# Patient Record
Sex: Female | Born: 1985
Health system: Southern US, Community
[De-identification: ages and names within clinical notes are randomized; demographics above are authoritative.]

## PROBLEM LIST (undated history)

## (undated) DIAGNOSIS — A630 Anogenital (venereal) warts: Secondary | ICD-10-CM

## (undated) DIAGNOSIS — Z23 Encounter for immunization: Secondary | ICD-10-CM

## (undated) DIAGNOSIS — B3731 Acute candidiasis of vulva and vagina: Secondary | ICD-10-CM

## (undated) DIAGNOSIS — F32A Depression, unspecified: Secondary | ICD-10-CM

## (undated) DIAGNOSIS — L732 Hidradenitis suppurativa: Secondary | ICD-10-CM

## (undated) DIAGNOSIS — B373 Candidiasis of vulva and vagina: Secondary | ICD-10-CM

## (undated) DIAGNOSIS — F419 Anxiety disorder, unspecified: Secondary | ICD-10-CM

## (undated) DIAGNOSIS — R87810 Cervical high risk human papillomavirus (HPV) DNA test positive: Secondary | ICD-10-CM

## (undated) HISTORY — DX: Cervical high risk human papillomavirus (HPV) DNA test positive: R87.810

## (undated) HISTORY — DX: Anogenital (venereal) warts: A63.0

## (undated) HISTORY — DX: Candidiasis of vulva and vagina: B37.3

## (undated) HISTORY — DX: Hidradenitis suppurativa: L73.2

## (undated) HISTORY — DX: Encounter for immunization: Z23

## (undated) HISTORY — PX: WISDOM TOOTH EXTRACTION: SHX21

## (undated) HISTORY — DX: Acute candidiasis of vulva and vagina: B37.31

## (undated) HISTORY — DX: Depression, unspecified: F32.A

## (undated) HISTORY — PX: TONSILLECTOMY AND ADENOIDECTOMY: SUR1326

## (undated) HISTORY — DX: Anxiety disorder, unspecified: F41.9

---
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
## 2008-06-21 ENCOUNTER — Ambulatory Visit: Payer: Self-pay | Admitting: Internal Medicine

## 2010-02-03 ENCOUNTER — Ambulatory Visit: Payer: Self-pay | Admitting: Internal Medicine

## 2010-06-26 ENCOUNTER — Ambulatory Visit: Payer: Self-pay | Admitting: Internal Medicine

## 2010-07-06 ENCOUNTER — Ambulatory Visit: Payer: Self-pay | Admitting: Internal Medicine

## 2010-07-12 IMAGING — US TRANSABDOMINAL ULTRASOUND OF PELVIS
1 series · 17 of 25 positions shown · non-contrast
Comparison: none

REASON FOR EXAM: menorrhagia x 3 mths
COMMENTS:

[Series 1: transabdominal ultrasound of pelvis · 17 of 29 slices shown]
[im 1/29]
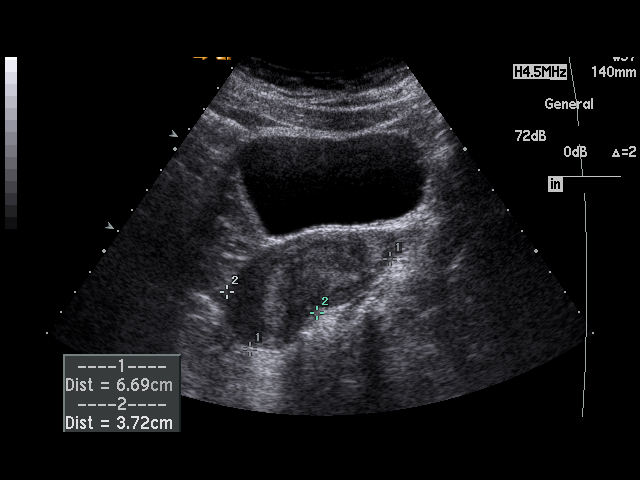
[im 3/29]
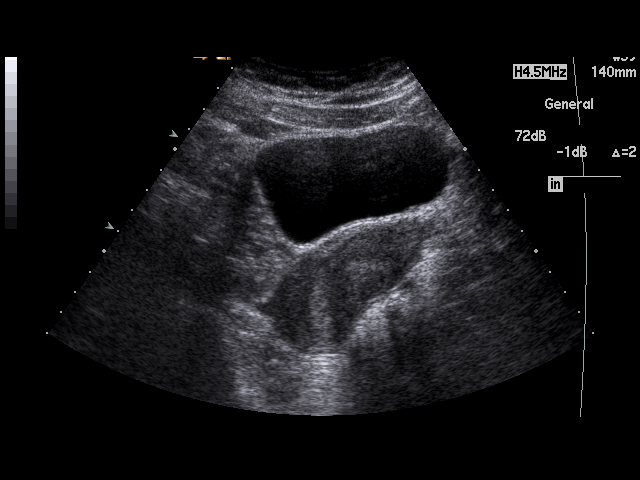
[im 4/29]
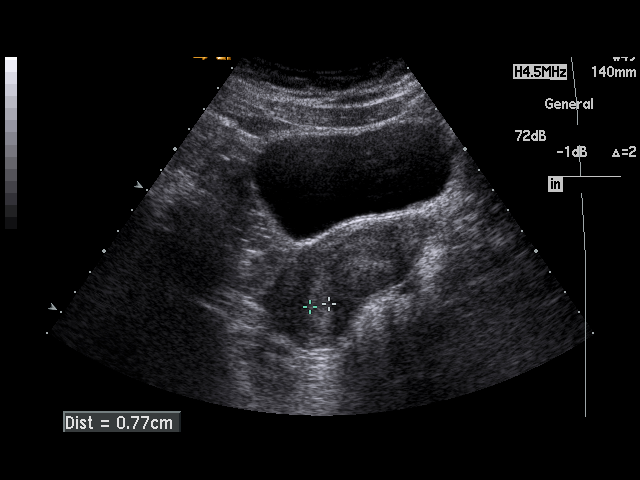
[im 6/29]
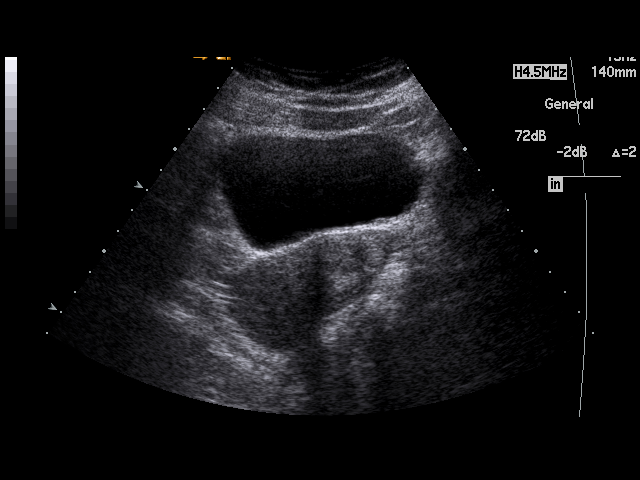
[im 8/29]
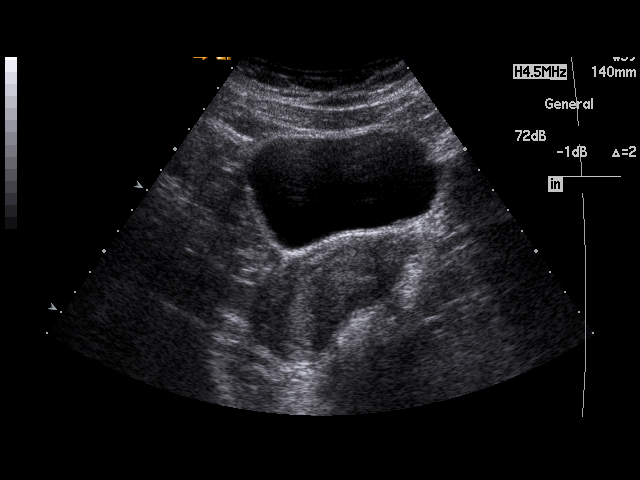
[im 10/29]
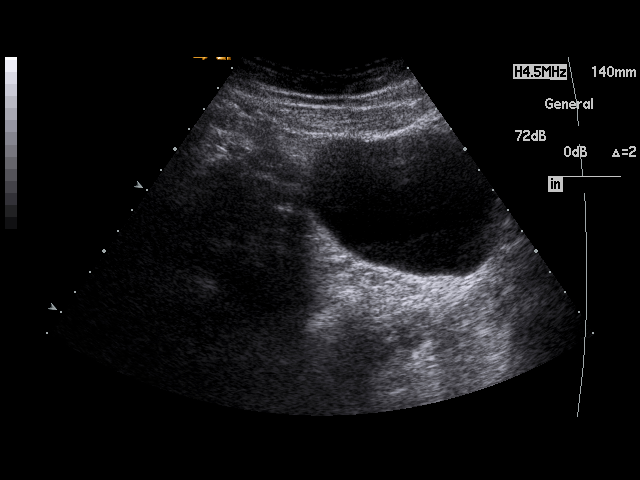
[im 11/29]
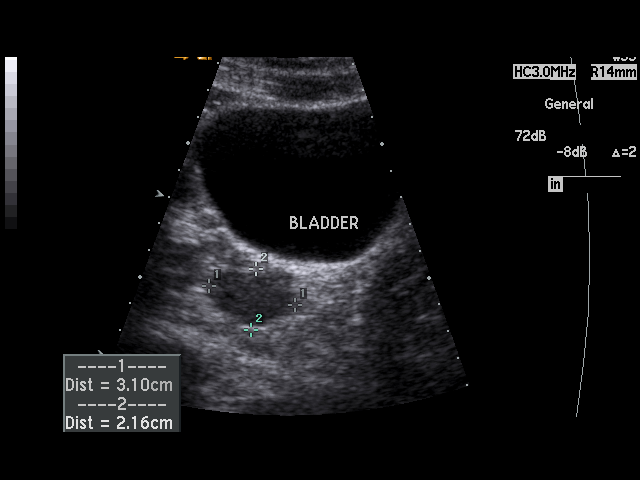
[im 13/29]
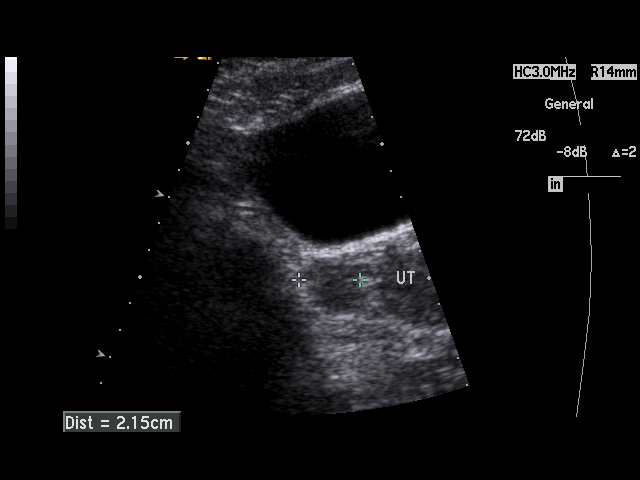
[im 15/29]
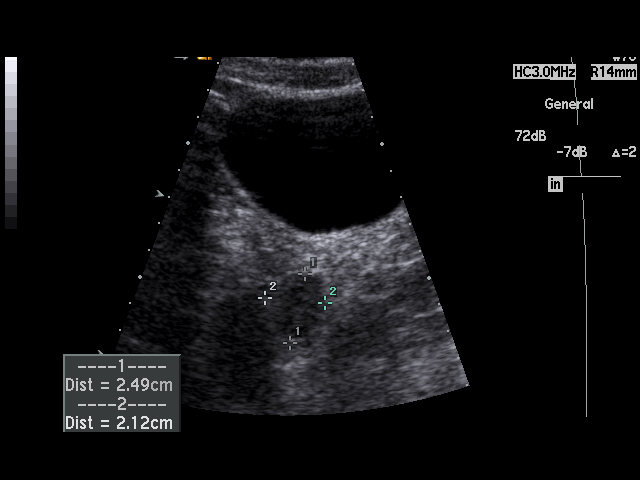
[im 16/29]
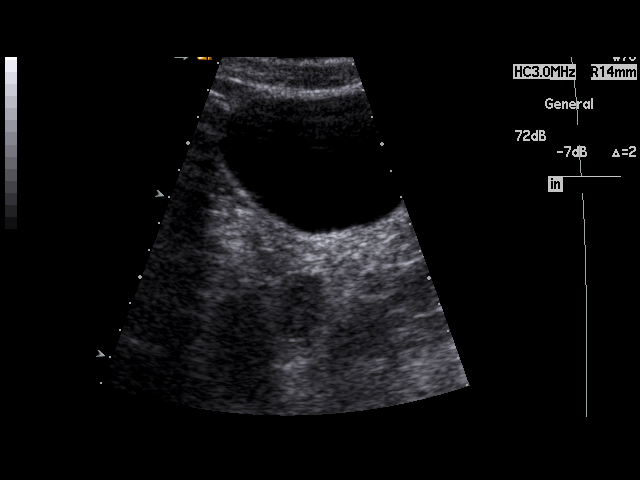
[im 18/29]
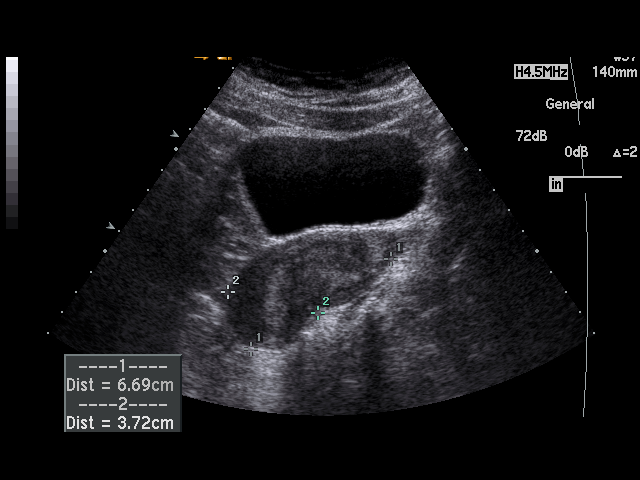
[im 19/29]
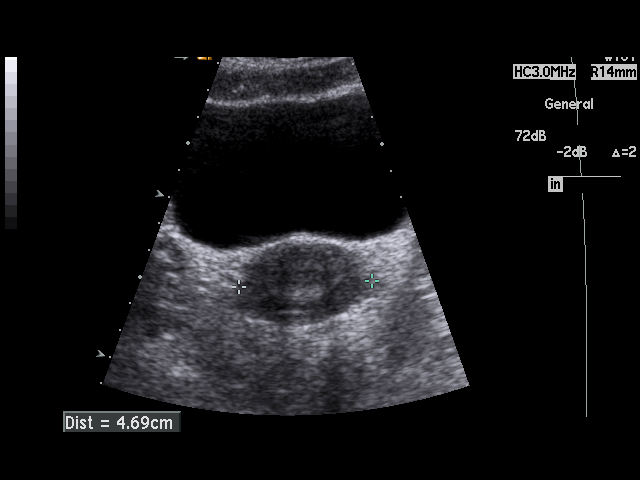
[im 22/29]
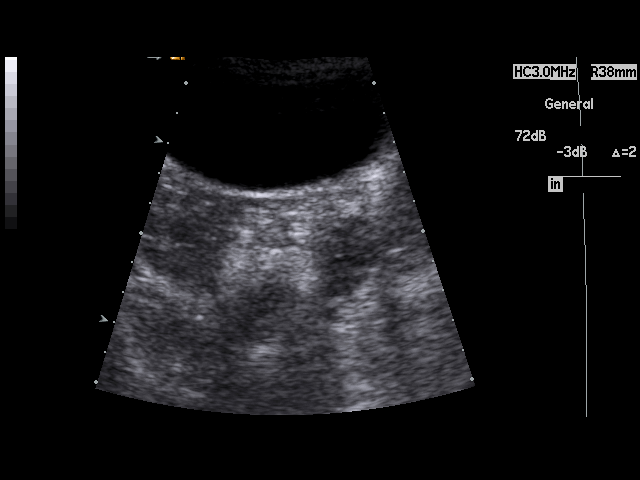
[im 23/29]
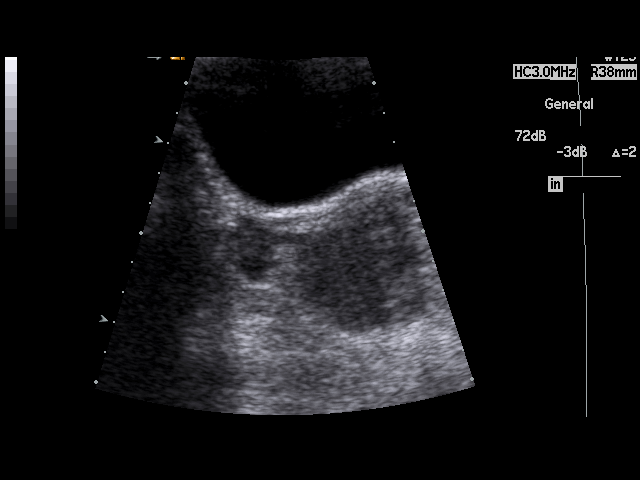
[im 25/29]
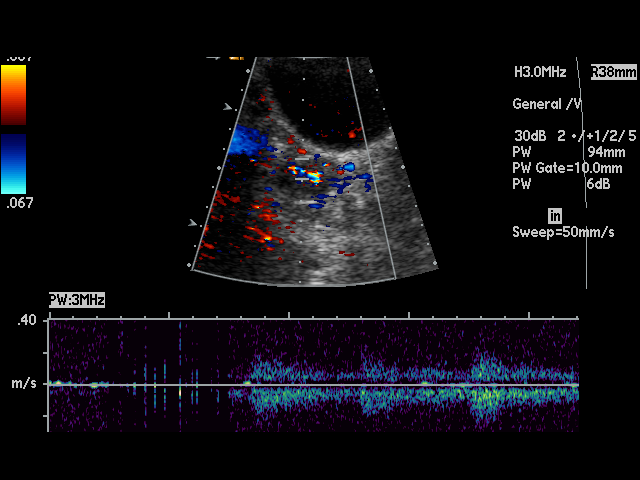
[im 26/29]
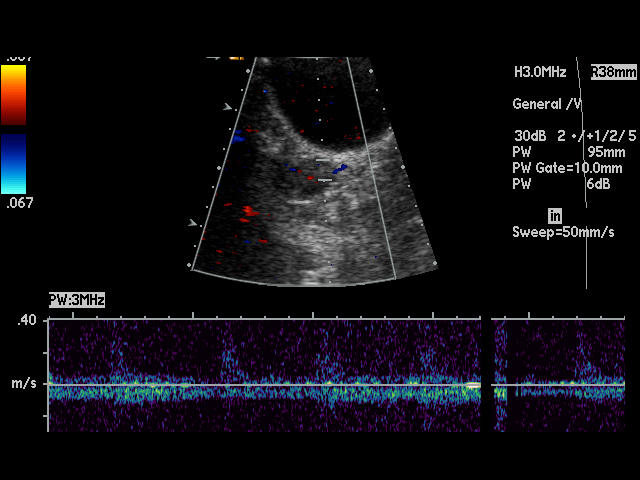
[im 29/29]
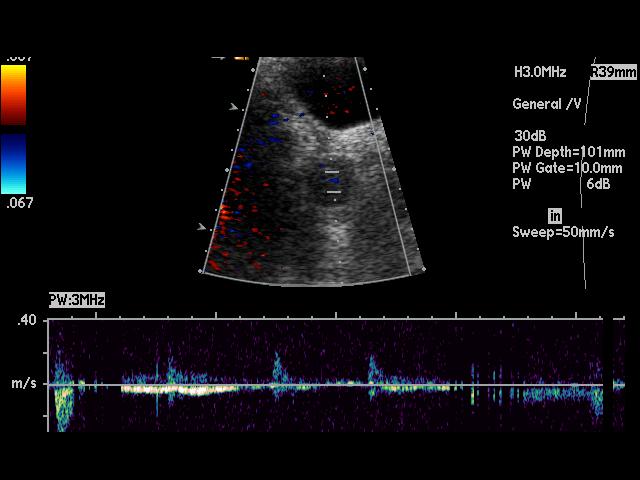

[17 of 25 positions shown; findings below may reference images not displayed]

PROCEDURE:     US  - US PELVIS MASS EXAM  - [DATE]  [DATE] [DATE]  [DATE]

RESULT:     The uterus measures 6.69 cm x 3.72 cm before 0.69 cm. The
endometrium measures 6 mm in thickness. No intrauterine products of
conception are seen. The right and left ovaries are visualized. The right
ovary measures 3.1 cm at maximum diameter and the left ovary measures
cm at maximum diameter. No abnormal adnexal masses are seen. There is no
free fluid observed in the pelvis. The visualized portion of the urinary
bladder is normal in appearance.
IMPRESSION: 1. No significant abnormalities are noted.

## 2012-03-15 LAB — OB RESULTS CONSOLE ANTIBODY SCREEN: Antibody Screen: NEGATIVE

## 2012-03-15 LAB — OB RESULTS CONSOLE RPR: RPR: NONREACTIVE

## 2012-03-22 LAB — OB RESULTS CONSOLE GC/CHLAMYDIA
Chlamydia: NEGATIVE
Gonorrhea: NEGATIVE

## 2012-05-17 NOTE — L&D Delivery Note (Signed)
Delivery Note At 6:24 PM a viable female was delivered via Vaginal, Spontaneous Delivery (Presentation: ; DOA ).  APGAR: 9, 9; weight .   Placenta status: Intact, Spontaneous.  Cord: 3 vessels with the following complications: None.  Cord pH: not indicated. Loose nuchal, delivered through- head, shoulders and body all in 1 push.  Anesthesia: Epidural  Episiotomy: None Lacerations: 2nd degree;Vaginal, 2 periurethral Suture Repair: 2.0 3.0 vicryl rapide 2-0 for deep interrupted layer, 2 figure of 8 sutures, 3-0 in standard fashion, 4-0 vicryl used for figure of 8 suture for each periuretrhal lacerations Est. Blood Loss (mL): 450  Mom to postpartum.  Baby to nursery-stable.  Jamiaya Bina A. 10/09/2012, 6:57 PM

## 2012-06-07 ENCOUNTER — Other Ambulatory Visit (HOSPITAL_COMMUNITY): Payer: Self-pay | Admitting: Obstetrics

## 2012-06-07 DIAGNOSIS — O283 Abnormal ultrasonic finding on antenatal screening of mother: Secondary | ICD-10-CM

## 2012-06-08 ENCOUNTER — Encounter (HOSPITAL_COMMUNITY): Payer: Self-pay

## 2012-06-08 ENCOUNTER — Other Ambulatory Visit: Payer: Self-pay

## 2012-06-08 ENCOUNTER — Ambulatory Visit (HOSPITAL_COMMUNITY)
Admission: RE | Admit: 2012-06-08 | Discharge: 2012-06-08 | Disposition: A | Discharge status: Home / Self Care | Payer: BC Managed Care – PPO | Source: Ambulatory Visit | Attending: Obstetrics | Admitting: Obstetrics

## 2012-06-08 DIAGNOSIS — Z363 Encounter for antenatal screening for malformations: Secondary | ICD-10-CM | POA: Insufficient documentation

## 2012-06-08 DIAGNOSIS — O358XX0 Maternal care for other (suspected) fetal abnormality and damage, not applicable or unspecified: Secondary | ICD-10-CM

## 2012-06-08 DIAGNOSIS — Z1389 Encounter for screening for other disorder: Secondary | ICD-10-CM | POA: Insufficient documentation

## 2012-06-08 DIAGNOSIS — O35DXX0 Maternal care for other (suspected) fetal abnormality and damage, fetal gastrointestinal anomalies, not applicable or unspecified: Secondary | ICD-10-CM

## 2012-06-08 DIAGNOSIS — O283 Abnormal ultrasonic finding on antenatal screening of mother: Secondary | ICD-10-CM

## 2012-06-08 NOTE — Progress Notes (Signed)
Shawna Craig  was seen today for an ultrasound appointment.  See full report in [facility].  Comments: [name] was seen today due to suspected echogenic bowel.  Her work up thus far has included a negative CF carrier screen and negative CMV serology.  The initial Toxo IgM was elevated, but the confirmatory test was not consistent with recent infection.  First trimester screen was low risk for aneuploidy, however, an elevated HCG was noted which may be associated with a higher risk for fetal growth restriction and preeclampsia.  On ultrasound today, the bowel does look prominent with at least one area with bowel as echogenic as adjacent bone.  The remained of the antomy was within normal limits and no other markers for aneuploidy was noted.  The findings and limitations of the study were discussed with the patient.  See separate not from [facility].  The patient declined amniocentesis for karyotype and viral PCR, but elected to undergo NIPT (cell free fetal DNA).  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  Alpha Gula, MD

## 2012-06-09 NOTE — Progress Notes (Signed)
Genetic Counseling  High-Risk Gestation Note  Appointment Date:  06/08/2012 Referred By: Lendon Colonel., MD Date of Birth:  [birth year]    Pregnancy History: G1P0000 Estimated Date of Delivery: 10/20/12 Estimated Gestational Age: [redacted]w[redacted]d Attending: Alpha Gula, MD   I met with Mrs. Shawna Craig and her husband, Mr. Shawna Craig, for genetic counseling because of the previous ultrasound finding of echogenic bowel.  We began by reviewing the ultrasound in detail. Ultrasound performed at the time of today's visit confirmed the finding of echogenic bowel. The ultrasound report will be sent under separate cover. We discussed that an echogenic bowel refers to an area in the fetal intestines that appears brighter, or denser, than the liver and/or bone on ultrasound. Echogenic bowel is detected in approximately 1% of fetuses at the time of a second trimester ultrasound evaluation.  Most of the time, this brightness does not cause any problems and will spontaneously resolve.  Possible causes of an echogenic bowel include undergrowth of the bowel, an obstruction or blockage of the intestines, the fetus swallowing a small amount of blood present in the amniotic fluid, an intrauterine infection, a chromosome condition, cystic fibrosis, or a normal variation in development.   Shawna Craig reported no known bleeding during pregnancy. Maternal serum TORCH titers were previously performed through her OB office and were reviewed by Shawna Craig at the time of today's visit.  We briefly reviewed cystic fibrosis (CF) including the typical features and prognosis and the autosomal recessive inheritance of the condition. The carrier frequency in the Caucasian population is approximately 1 in 65.  The presence of echogenic bowel on prenatal ultrasound is associated with an increased risk for CF. [facility] medical records indicate that she previously had negative/normal CF carrier screening for 136 mutations  through her OB office. Thus, her risk to be a CF carrier has been reduce to approximately 1 in 344. The couple understands that CF carrier screening does not eliminate the chance to be a CF carrier or for cystic fibrosis to be present in the pregnancy.    Shawna Craig previously had first trimester screening, which was within normal limits, indicating a less than 1 in 10,000 chance for fetal Down syndrome, trisomy 48, and trisomy 74. We discussed that the presence of echogenic bowel on ultrasound increases the chance for fetal Down syndrome from the patient's First trimester screen result to approximately 1 in 1,666, which is still less than her a priori age related risk. We reviewed other available screening and diagnostic options including cell free fetal DNA testing and amniocentesis.  Specifically, we discussed that noninvasive prenatal testing (NIPT), which utilizes cell free fetal DNA found in the maternal circulation. This test is not diagnostic for chromosome conditions, but can provide information regarding the presence or absence of extra fetal DNA for chromosomes 13, 18 and 21. Thus, it would not identify or rule out all fetal aneuploidy. The reported detection rate is greater than 99% for Trisomy 21, greater than 97% for Trisomy 18, and is approximately 80% (8 out of 10) for Trisomy 13. The false positive rate is reported to be 0.1% for any of these conditions.  We discussed the diagnostic testing option of amniocentesis. We reviewed that in addition to chromosome analysis, infection studies can also be performed on amniotic fluid. We discussed the approximate 1 in 300-500 risk for complications associated with amniocentesis, including spontaneous pregnancy loss. We discussed the risks, limitations, and benefits of each.   After consideration  of all the options, and a clear understanding of the newness and limitations of NIPT, she elected to proceed with cell free fetal DNA testing  ([facility]) at the time of today's visit. The patient declined amniocentesis at this time. Shawna Craig results will be available in 8-10 days and will be forwarded to your office when we receive them.  She understands that ultrasound and screening cannot rule out all birth defects or genetic syndromes. A follow-up ultrasound was planned for 07/03/12. They understand that ultrasound and screening tests performed (First trimester screen, CF screen, and NIPT) cannot rule out all birth defects or genetic syndromes. The patient was advised of this limitation and states she still does not want diagnostic testing at this time.   Both family histories were reviewed and found to be contributory for blindness from birth for the patient's maternal uncle. He was reportedly born very premature and is otherwise healthy. We discussed that congenital blindness can be Craig to genetics, environmental exposure, or combination of genetics and environment. In the case where the cause of extreme prematurity is suspected for her uncle's blindness, then recurrence risk to relatives would not likely be increased. However, in the case of a different underlying cause, recurrence risk estimate may change. Shawna Craig reported that her father was adopted. Thus, she does not have detailed information regarding her paternal extended family history. Without further information regarding the provided family history, an accurate genetic risk cannot be calculated. Further genetic counseling is warranted if more information is obtained.  Additionally, both Shawna Craig reported Jewish ancestry. We discussed the availability of carrier screening for certain genetic conditions based on Jewish ancestry.  Genetic testing is available for some of the more common conditions, including Canavan disease, cystic fibrosis, Tay sachs disease, Gaucher disease, Mucolipidosis type IV,  Neimann-Pick type A, Fanconi anemia type C, Bloom syndrome, and familial  dysautonomia.  All of these conditions are inherited in an autosomal recessive fashion, where both parents have to be carriers of the condition before a baby is at increased risk (1 in 4, or 25% risk) to inherit the disease. Carrier screening performed through a standard blood draw can reduce but not eliminate a person's chance to be a carrier for these conditions.  Shawna Craig declined Ashkenazi Jewish ancestry based carrier screening at the time of today's visit.   Shawna Craig denied exposure to environmental toxins or chemical agents. She denied the use of alcohol, tobacco or street drugs. She denied significant viral illnesses during the course of her pregnancy. Her medical and surgical histories were noncontributory.   I counseled this couple regarding the above risks and available options.  The approximate face-to-face time with the genetic counselor was 30 minutes.  Shawna Plowman, MS Certified Genetic Counselor 06/09/2012

## 2012-06-20 ENCOUNTER — Telehealth (HOSPITAL_COMMUNITY): Payer: Self-pay | Admitting: MS"

## 2012-06-20 NOTE — Telephone Encounter (Signed)
Called Shawna Craig to discuss her [facility], cell free fetal DNA testing. Testing was offered because of ultrasound finding of echogenic bowel. We reviewed that these are within normal limits, showing a less than 1 in 10,000 risk for trisomies 21, 18 and 13. We reviewed that this testing identifies > 99% of pregnancies with trisomy 21, >98% of pregnancies with trisomy 59, and >80% with trisomy 20; the false positive rate is <0.1% for all conditions. She understands that this testing does not identify all genetic conditions. All questions were answered to her satisfaction, she was encouraged to call with additional questions or concerns.  Quinn Plowman, MS Certified Genetic Counselor 06/20/2012 11:32 AM

## 2012-06-30 ENCOUNTER — Inpatient Hospital Stay (HOSPITAL_COMMUNITY): Admission: AD | Admit: 2012-06-30 | Payer: Self-pay | Source: Ambulatory Visit | Admitting: Obstetrics

## 2012-07-03 ENCOUNTER — Ambulatory Visit (HOSPITAL_COMMUNITY)
Admission: RE | Admit: 2012-07-03 | Discharge: 2012-07-03 | Disposition: A | Discharge status: Home / Self Care | Payer: BC Managed Care – PPO | Source: Ambulatory Visit | Attending: Obstetrics | Admitting: Obstetrics

## 2012-07-03 VITALS — BP 113/79 | HR 115 | Wt 177.5 lb

## 2012-07-03 DIAGNOSIS — O358XX0 Maternal care for other (suspected) fetal abnormality and damage, not applicable or unspecified: Secondary | ICD-10-CM | POA: Insufficient documentation

## 2012-07-03 DIAGNOSIS — O283 Abnormal ultrasonic finding on antenatal screening of mother: Secondary | ICD-10-CM

## 2012-07-03 DIAGNOSIS — Z3689 Encounter for other specified antenatal screening: Secondary | ICD-10-CM | POA: Insufficient documentation

## 2012-07-03 NOTE — Progress Notes (Signed)
Shawna Craig  was seen today for an ultrasound appointment.  See full report in [facility].  Comments: [name] was seen today for follow up due to suspected echogenic bowel.  Her work up thus far has included a negative CF carrier screen and negative CMV serology.  The initial Toxo IgM was elevated, but the confirmatory test was not consistent with recent infection.  First trimester screen was low risk for aneuploidy, however, an elevated HCG was noted which may be associated with a higher risk for fetal growth restriction and preeclampsia.  NIPT returned low risk for aneuploidy.  Impression: Single IUP at 24 3/7 weeks Interval growth is appropriate (47th %tile) The fetal bowel appears prominent, but is not as echogenic as adjacent bone Otherwise normal interval anatomy Normal amniotic fluid volume  Recommendations Recommend follow-up ultrasound examination in 4 weeks for interval growth.  Alpha Gula, MD

## 2012-07-28 ENCOUNTER — Other Ambulatory Visit (HOSPITAL_COMMUNITY): Payer: Self-pay | Admitting: Obstetrics

## 2012-07-28 DIAGNOSIS — O358XX0 Maternal care for other (suspected) fetal abnormality and damage, not applicable or unspecified: Secondary | ICD-10-CM

## 2012-07-31 ENCOUNTER — Ambulatory Visit (HOSPITAL_COMMUNITY)
Admission: RE | Admit: 2012-07-31 | Discharge: 2012-07-31 | Disposition: A | Discharge status: Home / Self Care | Payer: BC Managed Care – PPO | Source: Ambulatory Visit | Attending: Obstetrics | Admitting: Obstetrics

## 2012-07-31 DIAGNOSIS — O358XX0 Maternal care for other (suspected) fetal abnormality and damage, not applicable or unspecified: Secondary | ICD-10-CM | POA: Insufficient documentation

## 2012-07-31 DIAGNOSIS — Z3689 Encounter for other specified antenatal screening: Secondary | ICD-10-CM | POA: Insufficient documentation

## 2012-07-31 NOTE — Progress Notes (Signed)
Shawna Craig  was seen today for an ultrasound appointment.  See full report in [facility].  Comments: [name] was seen today for follow up due to echogenic bowel.  Her work up thus far has included a negative CF carrier screen and negative CMV serology.  The initial Toxo IgM was elevated, but the confirmatory test was not consistent with recent infection.  First trimester screen was low risk for aneuploidy, however, an elevated HCG was noted which may be associated with a higher risk for fetal growth restriction and preeclampsia.  NIPT returned low risk for aneuploidy.  Impression: Single IUP at 28 3/7 weeks Interval growth is appropriate (51st %tile) The fetal bowel appears prominent, but is not as echogenic as adjacent bone Otherwise normal interval anatomy Normal amniotic fluid volume  Recommendations: Recommend follow-up ultrasound examination in 4 weeks for interval growth - please contact our office if you would prefer to have this procedure done here.  Alpha Gula, MD

## 2012-08-01 NOTE — Addendum Note (Signed)
Encounter addended by: Ty Hilts, RN on: 08/01/2012  9:54 AM<BR>     Documentation filed: Charges VN, OB Prenatal Vitals, Episodes

## 2012-08-03 LAB — OB RESULTS CONSOLE RPR: RPR: NONREACTIVE

## 2012-09-25 LAB — OB RESULTS CONSOLE GBS
GBS: POSITIVE
GBS: POSITIVE

## 2012-10-09 ENCOUNTER — Encounter (HOSPITAL_COMMUNITY): Payer: Self-pay | Admitting: *Deleted

## 2012-10-09 ENCOUNTER — Encounter (HOSPITAL_COMMUNITY): Payer: Self-pay | Admitting: Anesthesiology

## 2012-10-09 ENCOUNTER — Inpatient Hospital Stay (HOSPITAL_COMMUNITY): Payer: BC Managed Care – PPO | Admitting: Anesthesiology

## 2012-10-09 ENCOUNTER — Inpatient Hospital Stay (HOSPITAL_COMMUNITY)
Admission: AD | Admit: 2012-10-09 | Discharge: 2012-10-11 | DRG: 373 | Disposition: A | Discharge status: Home / Self Care | Payer: BC Managed Care – PPO | Source: Ambulatory Visit | Attending: Obstetrics | Admitting: Obstetrics

## 2012-10-09 DIAGNOSIS — O9903 Anemia complicating the puerperium: Secondary | ICD-10-CM | POA: Diagnosis not present

## 2012-10-09 DIAGNOSIS — O99892 Other specified diseases and conditions complicating childbirth: Secondary | ICD-10-CM | POA: Diagnosis present

## 2012-10-09 DIAGNOSIS — Z22330 Carrier of Group B streptococcus: Secondary | ICD-10-CM

## 2012-10-09 DIAGNOSIS — Z880 Allergy status to penicillin: Secondary | ICD-10-CM

## 2012-10-09 DIAGNOSIS — O358XX1 Maternal care for other (suspected) fetal abnormality and damage, fetus 1: Secondary | ICD-10-CM

## 2012-10-09 DIAGNOSIS — O35DXX1 Maternal care for other (suspected) fetal abnormality and damage, fetal gastrointestinal anomalies, fetus 1: Secondary | ICD-10-CM

## 2012-10-09 DIAGNOSIS — D62 Acute posthemorrhagic anemia: Secondary | ICD-10-CM | POA: Diagnosis not present

## 2012-10-09 LAB — CBC
HCT: 35.9 % — ABNORMAL LOW (ref 36.0–46.0)
MCH: 29.2 pg (ref 26.0–34.0)
MCV: 87.3 fL (ref 78.0–100.0)
Platelets: 201 10*3/uL (ref 150–400)
RBC: 4.11 MIL/uL (ref 3.87–5.11)
WBC: 11.1 10*3/uL — ABNORMAL HIGH (ref 4.0–10.5)

## 2012-10-09 LAB — POCT FERN TEST: POCT Fern Test: POSITIVE

## 2012-10-09 LAB — RPR: RPR Ser Ql: NONREACTIVE

## 2012-10-09 LAB — ABO/RH: ABO/RH(D): A POS

## 2012-10-09 LAB — TYPE AND SCREEN

## 2012-10-09 MED ORDER — EPHEDRINE 5 MG/ML INJ
10.0000 mg | INTRAVENOUS | Status: DC | PRN
  Filled 2012-10-09: qty 2
  Filled 2012-10-09: qty 4

## 2012-10-09 MED ORDER — LANOLIN HYDROUS EX OINT: TOPICAL_OINTMENT | CUTANEOUS | Status: DC | PRN

## 2012-10-09 MED ORDER — DIBUCAINE 1 % RE OINT: 1.0000 "application " | TOPICAL_OINTMENT | RECTAL | Status: DC | PRN

## 2012-10-09 MED ORDER — LACTATED RINGERS IV SOLN: 500.0000 mL | Freq: Once | INTRAVENOUS | Status: DC

## 2012-10-09 MED ORDER — ZOLPIDEM TARTRATE 5 MG PO TABS: 5.0000 mg | ORAL_TABLET | Freq: Every evening | ORAL | Status: DC | PRN

## 2012-10-09 MED ORDER — ONDANSETRON HCL 4 MG/2ML IJ SOLN: 4.0000 mg | INTRAMUSCULAR | Status: DC | PRN

## 2012-10-09 MED ORDER — IBUPROFEN 600 MG PO TABS
600.0000 mg | ORAL_TABLET | Freq: Four times a day (QID) | ORAL | Status: DC
  Administered 2012-10-09 – 2012-10-11 (×8): 600 mg via ORAL
  Filled 2012-10-09 (×7): qty 1

## 2012-10-09 MED ORDER — TERBUTALINE SULFATE 1 MG/ML IJ SOLN: 0.2500 mg | Freq: Once | INTRAMUSCULAR | Status: DC | PRN

## 2012-10-09 MED ORDER — LIDOCAINE HCL (PF) 1 % IJ SOLN
30.0000 mL | INTRAMUSCULAR | Status: DC | PRN
  Filled 2012-10-09 (×2): qty 30

## 2012-10-09 MED ORDER — ACETAMINOPHEN 325 MG PO TABS: 650.0000 mg | ORAL_TABLET | ORAL | Status: DC | PRN

## 2012-10-09 MED ORDER — PHENYLEPHRINE 40 MCG/ML (10ML) SYRINGE FOR IV PUSH (FOR BLOOD PRESSURE SUPPORT)
80.0000 ug | PREFILLED_SYRINGE | INTRAVENOUS | Status: DC | PRN
  Filled 2012-10-09: qty 5
  Filled 2012-10-09: qty 2

## 2012-10-09 MED ORDER — BUTORPHANOL TARTRATE 1 MG/ML IJ SOLN
1.0000 mg | INTRAMUSCULAR | Status: DC | PRN
  Administered 2012-10-09 (×2): 1 mg via INTRAVENOUS
  Filled 2012-10-09 (×2): qty 1

## 2012-10-09 MED ORDER — BISACODYL 10 MG RE SUPP: 10.0000 mg | Freq: Every day | RECTAL | Status: DC | PRN

## 2012-10-09 MED ORDER — BENZOCAINE-MENTHOL 20-0.5 % EX AERO
1.0000 "application " | INHALATION_SPRAY | CUTANEOUS | Status: DC | PRN
  Filled 2012-10-09: qty 56

## 2012-10-09 MED ORDER — PROMETHAZINE HCL 25 MG/ML IJ SOLN
12.5000 mg | Freq: Four times a day (QID) | INTRAMUSCULAR | Status: DC | PRN
  Administered 2012-10-09: 12.5 mg via INTRAVENOUS
  Filled 2012-10-09: qty 1

## 2012-10-09 MED ORDER — SENNOSIDES-DOCUSATE SODIUM 8.6-50 MG PO TABS
2.0000 | ORAL_TABLET | Freq: Every day | ORAL | Status: DC
  Administered 2012-10-09 – 2012-10-10 (×2): 2 via ORAL

## 2012-10-09 MED ORDER — EPHEDRINE 5 MG/ML INJ
10.0000 mg | INTRAVENOUS | Status: DC | PRN
  Filled 2012-10-09: qty 2

## 2012-10-09 MED ORDER — FENTANYL 2.5 MCG/ML BUPIVACAINE 1/10 % EPIDURAL INFUSION (WH - ANES)
14.0000 mL/h | INTRAMUSCULAR | Status: DC | PRN
  Administered 2012-10-09 (×2): 14 mL/h via EPIDURAL
  Filled 2012-10-09 (×2): qty 125

## 2012-10-09 MED ORDER — OXYTOCIN BOLUS FROM INFUSION: 500.0000 mL | INTRAVENOUS | Status: DC

## 2012-10-09 MED ORDER — OXYTOCIN 40 UNITS IN LACTATED RINGERS INFUSION - SIMPLE MED
1.0000 m[IU]/min | INTRAVENOUS | Status: DC
  Administered 2012-10-09: 2 m[IU]/min via INTRAVENOUS
  Filled 2012-10-09: qty 1000

## 2012-10-09 MED ORDER — LIDOCAINE HCL (PF) 1 % IJ SOLN
INTRAMUSCULAR | Status: DC | PRN
  Administered 2012-10-09: 5 mL

## 2012-10-09 MED ORDER — PHENYLEPHRINE 40 MCG/ML (10ML) SYRINGE FOR IV PUSH (FOR BLOOD PRESSURE SUPPORT)
80.0000 ug | PREFILLED_SYRINGE | INTRAVENOUS | Status: DC | PRN
  Filled 2012-10-09: qty 2

## 2012-10-09 MED ORDER — ONDANSETRON HCL 4 MG PO TABS: 4.0000 mg | ORAL_TABLET | ORAL | Status: DC | PRN

## 2012-10-09 MED ORDER — CITRIC ACID-SODIUM CITRATE 334-500 MG/5ML PO SOLN: 30.0000 mL | ORAL | Status: DC | PRN

## 2012-10-09 MED ORDER — DIPHENHYDRAMINE HCL 50 MG/ML IJ SOLN: 12.5000 mg | INTRAMUSCULAR | Status: DC | PRN

## 2012-10-09 MED ORDER — LACTATED RINGERS IV SOLN
INTRAVENOUS | Status: DC
  Administered 2012-10-09 (×4): via INTRAVENOUS

## 2012-10-09 MED ORDER — OXYCODONE-ACETAMINOPHEN 5-325 MG PO TABS: 1.0000 | ORAL_TABLET | ORAL | Status: DC | PRN

## 2012-10-09 MED ORDER — WITCH HAZEL-GLYCERIN EX PADS: 1.0000 "application " | MEDICATED_PAD | CUTANEOUS | Status: DC | PRN

## 2012-10-09 MED ORDER — SIMETHICONE 80 MG PO CHEW: 80.0000 mg | CHEWABLE_TABLET | ORAL | Status: DC | PRN

## 2012-10-09 MED ORDER — ONDANSETRON HCL 4 MG/2ML IJ SOLN: 4.0000 mg | Freq: Four times a day (QID) | INTRAMUSCULAR | Status: DC | PRN

## 2012-10-09 MED ORDER — OXYTOCIN 40 UNITS IN LACTATED RINGERS INFUSION - SIMPLE MED
62.5000 mL/h | INTRAVENOUS | Status: DC
  Administered 2012-10-09: 62.5 mL/h via INTRAVENOUS

## 2012-10-09 MED ORDER — PRENATAL MULTIVITAMIN CH
1.0000 | ORAL_TABLET | Freq: Every day | ORAL | Status: DC
  Administered 2012-10-10: 1 via ORAL
  Filled 2012-10-09: qty 1

## 2012-10-09 MED ORDER — LACTATED RINGERS IV SOLN: 500.0000 mL | INTRAVENOUS | Status: DC | PRN

## 2012-10-09 MED ORDER — DIPHENHYDRAMINE HCL 25 MG PO CAPS: 25.0000 mg | ORAL_CAPSULE | Freq: Four times a day (QID) | ORAL | Status: DC | PRN

## 2012-10-09 MED ORDER — FENTANYL CITRATE 0.05 MG/ML IJ SOLN: 25.0000 ug | INTRAMUSCULAR | Status: DC | PRN

## 2012-10-09 MED ORDER — TETANUS-DIPHTH-ACELL PERTUSSIS 5-2.5-18.5 LF-MCG/0.5 IM SUSP: 0.5000 mL | Freq: Once | INTRAMUSCULAR | Status: DC

## 2012-10-09 MED ORDER — CLINDAMYCIN PHOSPHATE 900 MG/50ML IV SOLN
900.0000 mg | Freq: Three times a day (TID) | INTRAVENOUS | Status: DC
  Administered 2012-10-09 (×2): 900 mg via INTRAVENOUS
  Filled 2012-10-09 (×3): qty 50

## 2012-10-09 MED ORDER — IBUPROFEN 600 MG PO TABS: 600.0000 mg | ORAL_TABLET | Freq: Four times a day (QID) | ORAL | Status: DC | PRN

## 2012-10-09 MED ORDER — ZOLPIDEM TARTRATE 5 MG PO TABS
5.0000 mg | ORAL_TABLET | Freq: Every evening | ORAL | Status: DC | PRN
  Administered 2012-10-09: 5 mg via ORAL
  Filled 2012-10-09: qty 1

## 2012-10-09 MED ORDER — FLEET ENEMA 7-19 GM/118ML RE ENEM: 1.0000 | ENEMA | Freq: Every day | RECTAL | Status: DC | PRN

## 2012-10-09 MED ORDER — OXYCODONE-ACETAMINOPHEN 5-325 MG PO TABS
1.0000 | ORAL_TABLET | ORAL | Status: DC | PRN
  Administered 2012-10-11: 1 via ORAL
  Filled 2012-10-09: qty 1

## 2012-10-09 MED ORDER — FLUTICASONE PROPIONATE 50 MCG/ACT NA SUSP: 1.0000 | Freq: Every day | NASAL | Status: DC | PRN

## 2012-10-09 NOTE — Progress Notes (Signed)
S: Doing well, no complaints, pain getting controlled with epidural just now placed.  O: BP 119/63  Pulse 85  Temp(Src) 99 F (37.2 C) (Oral)  Resp 20  Ht 5\' 3"  (1.6 m)  Wt 83.915 kg (185 lb)  BMI 32.78 kg/m2  SpO2 98%  LMP 01/14/2012   FHT:  FHR: 135s bpm, variability: moderate,  accelerations:  Present,  decelerations:  Absent UC:   regular, every 4 minutes, pit at 3 munits/ min SVE:   Dilation: 4 Effacement (%): 80 Station: -1 Exam by:: Dr. Ernestina Penna   A / P:  26 y.o.  Obstetric History   G1   P0   T0   P0   A0   TAB0   SAB0   E0   M0   L0    at [redacted]w[redacted]d Augmentation of labor, progressing well  Fetal Wellbeing:  Category I Pain Control:  Epidural  Anticipated MOD:  NSVD  Shawna Quant A. 10/09/2012, 10:55 AM

## 2012-10-09 NOTE — Anesthesia Procedure Notes (Signed)

## 2012-10-09 NOTE — Anesthesia Preprocedure Evaluation (Signed)

## 2012-10-09 NOTE — H&P (Signed)
Shawna Craig is a 27 y.o. G1P0000 at [redacted]w[redacted]d presenting for ROM at 11:45 pm. Pt notes rare contractions  . Good fetal movement, No vaginal bleeding,  started leaking fluid  at 11:45 PM clear and continuing. Patient denies any vaginal lesions.  [facility] at [facility] since  early first trimester - At initial obstetric labs toxo IgG and positive and IgG negative. Repeat labs showed persistent findings however confirmatory labs from [place] reported both toxo titers negative. 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 - Echogenic bowel. See note above about toxo titers. Cystic fibrosis negative, HSV 1/2, IgM pos, IgG neg. 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 No past surgical history on file. Family History: family history is not on file. Social History:  has no tobacco, alcohol, and drug history on file.  Review of Systems - Negative except ROM, no significant ctx   Dilation: 1.5 Effacement (%): 80 Station: -3 Exam by:: K.Weiss RN Blood pressure 124/84, pulse 95, temperature 98.7 F (37.1 C), temperature source Oral, resp. rate 16, height 5\' 2"  (1.575 m), weight 83.915 kg (185 lb), last menstrual period 01/14/2012.  Physical Exam:  Gen: well appearing, no distress CV:  RRR Pulm: CTAB Back: no CVAT Abd: gravid, NT, no RUQ pain LE: trace edema, equal bilaterally, non-tender Toco: irreg FH: baseline 140s, accelerations present, no deceleratons, 10 beat variability  Prenatal labs: ABO, Rh: A/Positive/-- (10/30 0000) Antibody: Negative (10/30 0000) Rubella:  immune RPR: Nonreactive (10/30 0000)  HBsAg: Negative (10/30 0000)  HIV: Non-reactive (10/30 0000)  GBS: Positive (05/12 0000)  1 hr Glucola 125  Genetic screening nl NT, nl AFP Anatomy US echogenic bowel   Assessment/Plan: 27 y.o. G1P0000 at [redacted]w[redacted]d  rupture of membranes. An not in active labor. Will allow expectant management and consider initiation of Pitocin at 6 hours post rupture of membranes Reactive fetal testing GBS positive. Penicillin allergy as a child. Clindamycin sensitive will plan clindamycin Echogenic bowel, will or or on the liver, or and a half  alert peds   Shawna Bradner A. 10/09/2012, 1:50 AM

## 2012-10-09 NOTE — MAU Note (Signed)
Started leaking fluid about 45 minutes apart. G1 term

## 2012-10-10 LAB — CBC
HCT: 27.4 % — ABNORMAL LOW (ref 36.0–46.0)
Hemoglobin: 9.1 g/dL — ABNORMAL LOW (ref 12.0–15.0)
MCHC: 33.2 g/dL (ref 30.0–36.0)
MCV: 87.8 fL (ref 78.0–100.0)
RDW: 13.5 % (ref 11.5–15.5)

## 2012-10-10 MED ORDER — DOCUSATE SODIUM 100 MG PO CAPS
100.0000 mg | ORAL_CAPSULE | Freq: Every day | ORAL | Status: DC
  Administered 2012-10-10 – 2012-10-11 (×2): 100 mg via ORAL
  Filled 2012-10-10 (×2): qty 1

## 2012-10-10 MED ORDER — POLYSACCHARIDE IRON COMPLEX 150 MG PO CAPS
150.0000 mg | ORAL_CAPSULE | Freq: Every day | ORAL | Status: DC
  Administered 2012-10-10: 150 mg via ORAL
  Filled 2012-10-10 (×2): qty 1

## 2012-10-10 NOTE — Progress Notes (Signed)
PPD 1 SVD  S:  Reports feeling tired but overall ok - a little sore in bottom             Tolerating po/ No nausea or vomiting             Bleeding is light             Pain controlled with motrin and percocet             Up ad lib / ambulatory / voiding QS  Newborn breast feeding  / female newborn O:               VS: BP 106/60  Pulse 83  Temp(Src) 98.6 F (37 C) (Oral)  Resp 16  Ht 5\' 3"  (1.6 m)  Wt 83.915 kg (185 lb)  BMI 32.78 kg/m2  SpO2 99%  LMP 01/14/2012   LABS:  Recent Labs  10/09/12 0155 10/10/12 0700  WBC 11.1* 11.7*  HGB 12.0 9.1*  PLT 201 161                                         I&O: Intake/Output     05/26 0701 - 05/27 0700 05/27 0701 - 05/28 0700   Blood 450    Total Output 450     Net -450          Urine Occurrence 2 x                  Physical Exam:             Alert and oriented X3  Lungs: Clear and unlabored  Heart: regular rate and rhythm / no mumurs  Abdomen: soft, non-tender, non-distended              Fundus: firm, non-tender, U-1  Perineum: mild edema / ice pack in place  Lochia: light  Extremities: no edema, no calf pain or tenderness    A: PPD # 1              ABL anemia  Doing well - stable status  P:  Routine post partum orders             Iron and colace started  Anticipate DC in am  Marlinda Mike CNM, MSN, [facility] 10/10/2012, 8:50 AM

## 2012-10-10 NOTE — Anesthesia Postprocedure Evaluation (Signed)
  Anesthesia Post-op Note  Patient: Shawna Craig  Procedure(s) Performed: * No procedures listed *  Patient Location: Mother/Baby  Anesthesia Type:Epidural  Level of Consciousness: awake  Airway and Oxygen Therapy: Patient Spontanous Breathing  Post-op Pain: none  Post-op Assessment: Patient's Cardiovascular Status Stable, Respiratory Function Stable, Patent Airway, No signs of Nausea or vomiting, Adequate PO intake and Pain level controlled  Post-op Vital Signs: Reviewed and stable  Complications: No apparent anesthesia complications

## 2012-10-11 MED ORDER — POLYSACCHARIDE IRON COMPLEX 150 MG PO CAPS: 150.0000 mg | ORAL_CAPSULE | Freq: Every day | ORAL | Status: DC

## 2012-10-11 MED ORDER — OXYCODONE-ACETAMINOPHEN 5-325 MG PO TABS: 1.0000 | ORAL_TABLET | Freq: Four times a day (QID) | ORAL | Status: DC | PRN

## 2012-10-11 MED ORDER — DOCUSATE SODIUM 100 MG PO CAPS: 100.0000 mg | ORAL_CAPSULE | Freq: Three times a day (TID) | ORAL | Status: DC | PRN

## 2012-10-11 MED ORDER — IBUPROFEN 600 MG PO TABS: 600.0000 mg | ORAL_TABLET | Freq: Four times a day (QID) | ORAL | Status: DC | PRN

## 2012-10-11 NOTE — Progress Notes (Signed)
Patient ID: Shawna Craig, female   DOB: 03/22/1986, 27 y.o.   MRN: [id number] PPD # 2  Subjective: Pt reports feeling better today and ready for discharge/ Pain controlled with ibuprofen and occasional percocet Tolerating po/ Voiding without problems/ No n/v Bleeding is light/ Newborn info:   Feeding: breast    Objective:  VS: Blood pressure 108/67, pulse 64, temperature 98.2 F (36.8 C), temperature source Oral, resp. rate 19, height 5\' 3"  (1.6 m), weight 83.915 kg (185 lb), last menstrual period 01/14/2012, SpO2 97.00%, unknown if currently breastfeeding.    Recent Labs  10/09/12 0155 10/10/12 0700  WBC 11.1* 11.7*  HGB 12.0 9.1*  HCT 35.9* 27.4*  PLT 201 161    Blood type: --/--/A POS, A POS (05/26 0155) Rubella: Immune (10/30 0000)    Physical Exam:  General: A & O x 3  alert, cooperative and no distress CV: Regular rate and rhythm or S1S2 present Resp: clear Abdomen: soft, nontender, normal bowel sounds Uterine Fundus: firm, below umbilicus, nontender Perineum: healing with good reapproximation Lochia: minimal Ext: edema +1-+2 and Homans sign is negative, no sign of DVT    A/P: PPD # 2/ G1P1001/ S/P:SVD with 2nd deg lac Mild ABL anemia  Doing well and stable for discharge home RX: Ibuprofen 600mg  po Q 6 hrs prn pain #30 Refill x 1 Colace 100 mg po TID PRN #30 Refill x1 Percocet 5/325 1 to 2 po Q 4 hrs prn pain #15 No refill WOB/GYN booklet given Routine pp visit in 6wks   Demetrius Revel, MSN, [facility] 10/11/2012, 8:25 AM

## 2012-10-11 NOTE — Discharge Summary (Signed)
Obstetric Discharge Summary Reason for Admission: 27 y.o. G1P0000 at [redacted]w[redacted]d presenting for ROM. Initially had a positive toxo IgM (IgG negative) titer. HSV1 prophylaxis started at 36 weeks with Valtrex. Prenatal Procedures: none Intrapartum Procedures: GBS prophylaxis and epidural. (Clindamycin tx r/t PCN allergy) Postpartum Procedures: suture repair of second degree lac Complications-Operative and Postpartum: 2nd degree perineal laceration Hemoglobin  Date Value Range Status  10/10/2012 9.1* 12.0 - 15.0 g/dL Final     REPEATED TO VERIFY     DELTA CHECK NOTED     HCT  Date Value Range Status  10/10/2012 27.4* 36.0 - 46.0 % Final    Physical Exam:  General: alert, cooperative and no distress Lochia: appropriate Uterine Fundus: firm Incision: healing well DVT Evaluation: No evidence of DVT seen on physical exam. Negative Homan's sign.  Discharge Diagnoses: Term Pregnancy-delivered Mild ABL anemia Discharge Information: Date: 10/11/2012 Activity: unrestricted Diet: routine Medications: Ibuprofen, Colace, Iron and Percocet Condition: stable Instructions: refer to practice specific booklet Discharge to: home Follow-up Information   Follow up with [facility] A[facility] MD In 6 weeks.   Contact information:   [address] [place] [address] [phone]       Newborn Data: Live born female  Birth Weight: 6 lb 15.3 oz (3155 g) APGAR: 9, 9  Home with mother.  Shawna Craig K 10/11/2012, 8:34 AM

## 2012-12-18 ENCOUNTER — Ambulatory Visit: Payer: Self-pay | Admitting: General Surgery

## 2013-04-03 ENCOUNTER — Ambulatory Visit (INDEPENDENT_AMBULATORY_CARE_PROVIDER_SITE_OTHER): Payer: 59 | Admitting: Family Medicine

## 2013-04-03 ENCOUNTER — Encounter: Payer: Self-pay | Admitting: Family Medicine

## 2013-04-03 VITALS — BP 128/64 | HR 75 | Temp 98.2°F | Ht 62.25 in | Wt 171.5 lb

## 2013-04-03 DIAGNOSIS — D62 Acute posthemorrhagic anemia: Secondary | ICD-10-CM | POA: Insufficient documentation

## 2013-04-03 DIAGNOSIS — Z23 Encounter for immunization: Secondary | ICD-10-CM

## 2013-04-03 DIAGNOSIS — Z13220 Encounter for screening for lipoid disorders: Secondary | ICD-10-CM

## 2013-04-03 LAB — CBC WITH DIFFERENTIAL/PLATELET
Basophils Absolute: 0 10*3/uL (ref 0.0–0.1)
Basophils Relative: 0.4 % (ref 0.0–3.0)
Eosinophils Absolute: 0.1 10*3/uL (ref 0.0–0.7)
Hemoglobin: 13 g/dL (ref 12.0–15.0)
MCHC: 33.4 g/dL (ref 30.0–36.0)
MCV: 84.9 fl (ref 78.0–100.0)
Monocytes Absolute: 0.5 10*3/uL (ref 0.1–1.0)
Neutro Abs: 4 10*3/uL (ref 1.4–7.7)
RBC: 4.57 Mil/uL (ref 3.87–5.11)
RDW: 14.3 % (ref 11.5–14.6)

## 2013-04-03 LAB — LIPID PANEL: Total CHOL/HDL Ratio: 2

## 2013-04-03 NOTE — Progress Notes (Signed)
Subjective:    Patient ID: Shawna Craig, female    DOB: 02-24-1986, 27 y.o.   MRN: [id number]  HPI Here to get established  Currently sees Delilah Shan Just had a baby 6 months ago - a daughter  She currently stays home primarily - but [facility] [employer] several days per [facility] - exercise classes  (step and tone and a silver sneakers classes)    Lab Results  Component Value Date   WBC 11.7* 10/10/2012   HGB 9.1* 10/10/2012   HCT 27.4* 10/10/2012   MCV 87.8 10/10/2012   PLT 161 10/10/2012  has not had it re checked  Does take prenatal vitamins - does forget - ? If they have iron in them   No chronic health problems for the most part  Does have hydraadenitis  (occ has to take augmentin for infx) -never had MRSA  This runs in the family      Diet- is working on [employer] / trying to get pregnancy weight off  Exercise- teaches classes  Caffeine - fair amount right now  Sleep  4-5 hours per night with a baby that does not sleep well  Alcohol- not much - generally does not drink   She primarily pumps - technically still nursing   Non smoker  Mood -good/ no post partum depression   Plans to take some classes on line to get her bachelor's   She has never had her cholesterol checked - wants to do that today Wants to get her flu shot today   Interested in varicella imm when she is done breastfeeding (she did not have antibody)  Patient Active Problem List   Diagnosis Date Noted  . SVD (spontaneous vaginal delivery) 10/10/2012  . Postpartum care following vaginal delivery (5/26) 10/10/2012  . Normal labor and delivery - SROM 10/09/2012  . Echogenic focus of bowel, fetal, affecting care of mother, antepartum 06/08/2012   No past medical history on file. Past Surgical History  Procedure Laterality Date  . Tonsillectomy and adenoidectomy      2007   History  Substance Use Topics  . Smoking status: Never Smoker   . Smokeless tobacco: Not on file  .  Alcohol Use: No   No family history on file. Allergies  Allergen Reactions  . Penicillins Hives   Current Outpatient Prescriptions on File Prior to Visit  Medication Sig Dispense Refill  . acetaminophen (TYLENOL) 500 MG tablet Take 500 mg by mouth every 6 (six) hours as needed for pain.      Marland Kitchen ibuprofen (ADVIL,MOTRIN) 600 MG tablet Take 1 tablet (600 mg total) by mouth every 6 (six) hours as needed for pain.  30 tablet  1  . Prenatal Vit-Fe Fumarate-FA (PRENATAL MULTIVITAMIN) TABS Take 1 tablet by mouth daily.       No current facility-administered medications on file prior to visit.     Review of Systems Review of Systems  Constitutional: Negative for fever, appetite change, and unexpected weight change. pos for fatigue  Eyes: Negative for pain and visual disturbance.  Respiratory: Negative for cough and shortness of breath.   Cardiovascular: Negative for cp or palpitations    Gastrointestinal: Negative for nausea, diarrhea and constipation.  Genitourinary: Negative for urgency and frequency.  Skin: Negative for pallor or rash   Neurological: Negative for weakness, light-headedness, numbness and headaches.  Hematological: Negative for adenopathy. Does not bruise/bleed easily.  Psychiatric/Behavioral: Negative for dysphoric mood. The patient is not nervous/anxious.  Objective:   Physical Exam  Constitutional: She appears well-developed and well-nourished. No distress.  overwt and well appearing   HENT:  Head: Normocephalic and atraumatic.  Right Ear: External ear normal.  Left Ear: External ear normal.  Nose: Nose normal.  Mouth/Throat: Oropharynx is clear and moist.  Eyes: Conjunctivae and EOM are normal. Pupils are equal, round, and reactive to light. Right eye exhibits no discharge. Left eye exhibits no discharge. No scleral icterus.  Neck: Normal range of motion. Neck supple. No JVD present. No thyromegaly present.  Cardiovascular: Normal rate, regular rhythm,  normal heart sounds and intact distal pulses.  Exam reveals no gallop.   Pulmonary/Chest: Effort normal and breath sounds normal. No respiratory distress. She has no wheezes. She has no rales.  Abdominal: Soft. Bowel sounds are normal. She exhibits no distension and no mass. There is no tenderness.  Musculoskeletal: She exhibits no edema and no tenderness.  Lymphadenopathy:    She has no cervical adenopathy.  Neurological: She is alert. She has normal reflexes. No cranial nerve deficit. She exhibits normal muscle tone. Coordination normal.  No tremor   Skin: Skin is warm and dry. No rash noted. No erythema. No pallor.  Psychiatric: She has a normal mood and affect.          Assessment & Plan:

## 2013-04-03 NOTE — Assessment & Plan Note (Signed)
Lipid panel today Diet and exercise are overall good

## 2013-04-03 NOTE — Assessment & Plan Note (Signed)
Lab Results  Component Value Date   WBC 11.7* 10/10/2012   HGB 9.1* 10/10/2012   HCT 27.4* 10/10/2012   MCV 87.8 10/10/2012   PLT 161 10/10/2012   after baby was born  Taking pnv Some fatigue  Re check today -px iron if needed

## 2013-04-03 NOTE — Patient Instructions (Addendum)
Flu vaccine today  Lab for anemia and also cholesterol today  See gyn for annual exam  Take care of yourself and keep exercising  Make appt for chicken pox vaccine when you are done breastfeeding

## 2013-04-03 NOTE — Progress Notes (Signed)
Pre-visit discussion using our clinic review tool. No additional management support is needed unless otherwise documented below in the visit note.  

## 2013-04-04 ENCOUNTER — Encounter: Payer: Self-pay | Admitting: *Deleted

## 2013-06-15 ENCOUNTER — Ambulatory Visit (INDEPENDENT_AMBULATORY_CARE_PROVIDER_SITE_OTHER): Payer: 59 | Admitting: Internal Medicine

## 2013-06-15 ENCOUNTER — Encounter: Payer: Self-pay | Admitting: Internal Medicine

## 2013-06-15 VITALS — BP 110/72 | HR 79 | Temp 98.4°F | Wt 170.2 lb

## 2013-06-15 DIAGNOSIS — J069 Acute upper respiratory infection, unspecified: Secondary | ICD-10-CM

## 2013-06-15 MED ORDER — AZITHROMYCIN 250 MG PO TABS: ORAL_TABLET | ORAL | Status: DC

## 2013-06-15 MED ORDER — HYDROCODONE-HOMATROPINE 5-1.5 MG/5ML PO SYRP: 5.0000 mL | ORAL_SOLUTION | Freq: Three times a day (TID) | ORAL | Status: DC | PRN

## 2013-06-15 NOTE — Progress Notes (Signed)
HPI  Pt presents to the clinic today with c/o cough and nasal congestion. She reports this started 1 week ago. She has noticed some post nasal drip. The cough is unproductive. She does report fever, chills and body aches. She has tried OTC tylenol and decongestants without any relief. She has not had sick contacts that she is aware of.   Review of Systems     History reviewed. No pertinent past medical history.  History reviewed. No pertinent family history.  History   Social History  . Marital Status: Married    Spouse Name: Onalee HuaDavid    Number of Children: 1  . Years of Education: N/A   Occupational History  . exercise teacher  [employer]   Social History Main Topics  . Smoking status: Never Smoker   . Smokeless tobacco: Not on file  . Alcohol Use: No  . Drug Use: No  . Sexual Activity: Not on file   Other Topics Concern  . Not on file   Social History Narrative   Pt is married (Dr Karle StarchLetvak's daughter in law) , and has a 416 mo old daughter   Stays home and also teaches exercise classes at [employer]    Plans on finishing a degree on line     Allergies  Allergen Reactions  . Penicillins Hives     Constitutional: Positive headache, fatigue and fever. Denies abrupt weight changes.  HEENT:  Positive post nasal drip and nasal congestion. Denies eye redness, eye pain, pressure behind the eyes, facial pain, ear pain, ringing in the ears, wax buildup, runny nose or bloody nose. Respiratory: Positive cough. Denies difficulty breathing or shortness of breath.  Cardiovascular: Denies chest pain, chest tightness, palpitations or swelling in the hands or feet.   No other specific complaints in a complete review of systems (except as listed in HPI above).  Objective:   BP 110/72  Pulse 79  Temp(Src) 98.4 F (36.9 C) (Oral)  Wt 170 lb 4 oz (77.225 kg)  SpO2 98% Wt Readings from Last 3 Encounters:  06/15/13 170 lb 4 oz (77.225 kg)  04/03/13 171 lb 8 oz (77.792 kg)  10/09/12 185 lb  (83.915 kg)     General: Appears her stated age, well developed, well nourished in NAD. HEENT: Head: normal shape and size, sinuses nontender to palpation; Eyes: sclera white, no icterus, conjunctiva pink, PERRLA and EOMs intact; Ears: Tm's gray and intact, normal light reflex; Nose: mucosa pink and moist, septum midline; Throat/Mouth: + PND. Teeth present, mucosa erythematous and moist, no exudate noted, no lesions or ulcerations noted.  Neck:  Neck supple, trachea midline. No massses, lumps or thyromegaly present.  Cardiovascular: Normal rate and rhythm. S1,S2 noted.  No murmur, rubs or gallops noted. No JVD or BLE edema. No carotid bruits noted. Pulmonary/Chest: Normal effort and positive vesicular breath sounds. No respiratory distress. No wheezes, rales or ronchi noted.      Assessment & Plan:   Upper Respiratory Infection  Get some rest and drink plenty of water Do salt water gargles for the sore throat eRx for Azithromax x 5 days eRx for Hycodan cough syrup  RTC as needed or if symptoms persist.

## 2013-06-15 NOTE — Patient Instructions (Signed)

## 2013-06-15 NOTE — Progress Notes (Signed)
Pre-visit discussion using our clinic review tool. No additional management support is needed unless otherwise documented below in the visit note.  

## 2013-09-24 ENCOUNTER — Encounter: Payer: Self-pay | Admitting: Family Medicine

## 2013-09-24 ENCOUNTER — Ambulatory Visit (INDEPENDENT_AMBULATORY_CARE_PROVIDER_SITE_OTHER): Payer: 59 | Admitting: Family Medicine

## 2013-09-24 VITALS — BP 122/64 | HR 106 | Temp 98.0°F | Ht 62.25 in | Wt 167.5 lb

## 2013-09-24 DIAGNOSIS — J019 Acute sinusitis, unspecified: Secondary | ICD-10-CM | POA: Insufficient documentation

## 2013-09-24 DIAGNOSIS — J069 Acute upper respiratory infection, unspecified: Secondary | ICD-10-CM

## 2013-09-24 MED ORDER — AZITHROMYCIN 250 MG PO TABS: ORAL_TABLET | ORAL | Status: DC

## 2013-09-24 NOTE — Patient Instructions (Signed)
Get zyrtec otc and take 10 mg daily - in evenings  Take the zithromax as directed Saline nasal spray or netti pot is also helpful Update if not starting to improve in a week or if worsening

## 2013-09-24 NOTE — Progress Notes (Signed)
Pre visit review using our clinic review tool, if applicable. No additional management support is needed unless otherwise documented below in the visit note. 

## 2013-09-24 NOTE — Assessment & Plan Note (Signed)
In PCN all pt  Will cover with zithromax  Fluids/rest Zyrtec for drip and rhinorrhea Disc symptomatic care - see instructions on AVS  Update if not starting to improve in a week or if worsening

## 2013-09-24 NOTE — Progress Notes (Signed)
   Subjective:    Patient ID: Shawna Craig, female    DOB: Sep 01, 1985, 28 y.o.   MRN: [id number]  HPI Here with sinus symptoms - since march  A lot of drainage - post nasal drip is overwhelming - she tries to spit it out and it makes her nauseated  Yellow in color  Also nasal congestion / and a ST last week that got a bit better Some coughing -day and night  Cough is overall pretty dry  Chest was tight yesterday   Not a lot of facial pain   Daughter was sick last week  Taking tylenol/ ibuprofen/ decongestant and claritin   There are no active problems to display for this patient.  No past medical history on file. Past Surgical History  Procedure Laterality Date  . Tonsillectomy and adenoidectomy      2007   History  Substance Use Topics  . Smoking status: Never Smoker   . Smokeless tobacco: Not on file  . Alcohol Use: No   No family history on file. Allergies  Allergen Reactions  . Penicillins Hives   Current Outpatient Prescriptions on File Prior to Visit  Medication Sig Dispense Refill  . acetaminophen (TYLENOL) 500 MG tablet Take 500 mg by mouth every 6 (six) hours as needed for pain.      Marland Kitchen. ibuprofen (ADVIL,MOTRIN) 600 MG tablet Take 1 tablet (600 mg total) by mouth every 6 (six) hours as needed for pain.  30 tablet  1   No current facility-administered medications on file prior to visit.     Review of Systems Review of Systems  Constitutional: Negative for fever, appetite change,  and unexpected weight change.  ENT pos for cong/rhinorrhea and drip and ST Eyes: Negative for pain and visual disturbance.  Respiratory: Negative for wheeze and shortness of breath.   Cardiovascular: Negative for cp or palpitations    Gastrointestinal: Negative for nausea, diarrhea and constipation.  Genitourinary: Negative for urgency and frequency.  Skin: Negative for pallor or rash   Neurological: Negative for weakness, light-headedness, numbness and headaches.    Hematological: Negative for adenopathy. Does not bruise/bleed easily.  Psychiatric/Behavioral: Negative for dysphoric mood. The patient is not nervous/anxious.         Objective:   Physical Exam  Constitutional: She appears well-developed and well-nourished. No distress.  HENT:  Head: Normocephalic and atraumatic.  Right Ear: External ear normal.  Left Ear: External ear normal.  Mouth/Throat: Oropharynx is clear and moist. No oropharyngeal exudate.  Nares are injected and congested  Mild bilat maxillary sinus tenderness Clear post nasal drip     Eyes: Conjunctivae and EOM are normal. Pupils are equal, round, and reactive to light. Right eye exhibits no discharge. Left eye exhibits no discharge.  Neck: Normal range of motion. Neck supple.  Cardiovascular: Normal rate and regular rhythm.   Pulmonary/Chest: Effort normal and breath sounds normal. No respiratory distress. She has no wheezes. She has no rales.  Lymphadenopathy:    She has no cervical adenopathy.  Neurological: She is alert. No cranial nerve deficit.  Skin: Skin is warm and dry. No rash noted.  Psychiatric: She has a normal mood and affect.          Assessment & Plan:

## 2013-12-18 ENCOUNTER — Ambulatory Visit: Payer: 59 | Admitting: Family Medicine

## 2014-03-18 ENCOUNTER — Encounter: Payer: Self-pay | Admitting: Family Medicine

## 2014-04-19 ENCOUNTER — Ambulatory Visit (INDEPENDENT_AMBULATORY_CARE_PROVIDER_SITE_OTHER): Payer: 59

## 2014-04-19 DIAGNOSIS — Z23 Encounter for immunization: Secondary | ICD-10-CM

## 2014-07-24 IMAGING — US US OB FOLLOW-UP
1 series · 12 of 28 positions shown · non-contrast
Comparison: none

[Series 1: us ob follow-up · 0.23mm/px · 12 of 45 slices shown]
[im 2/45]
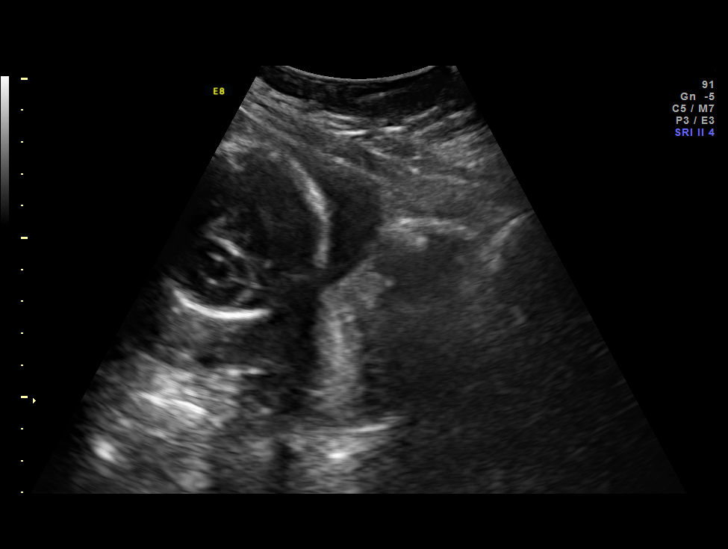
[im 5/45]
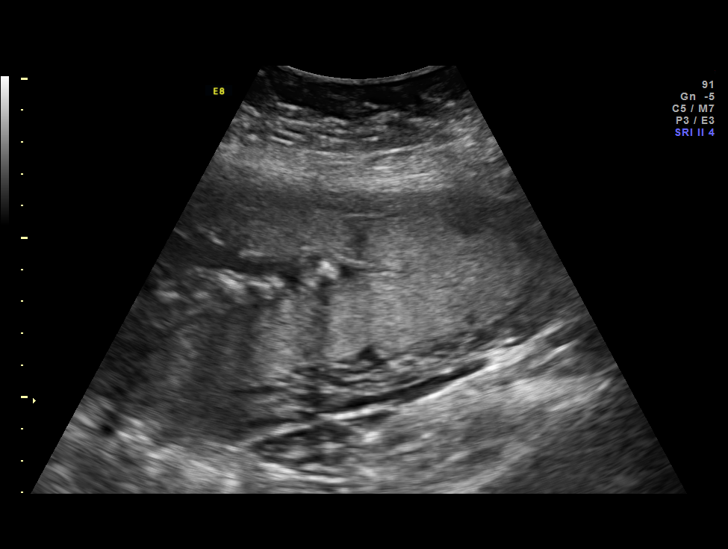
[im 9/45]
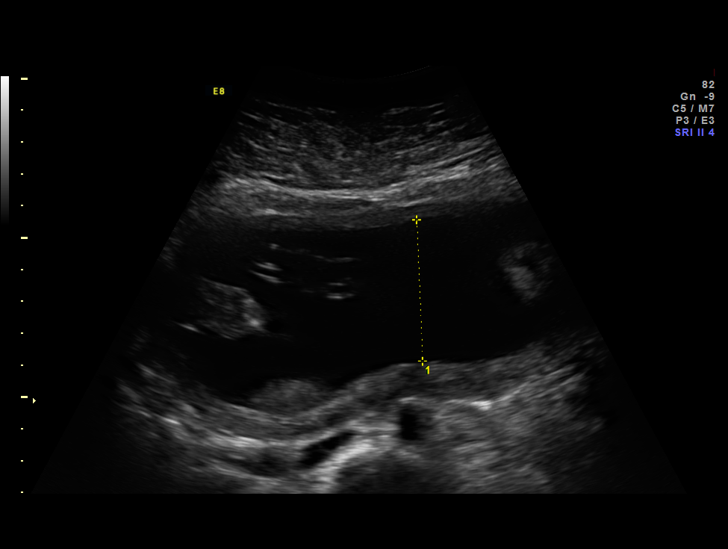
[im 14/45]
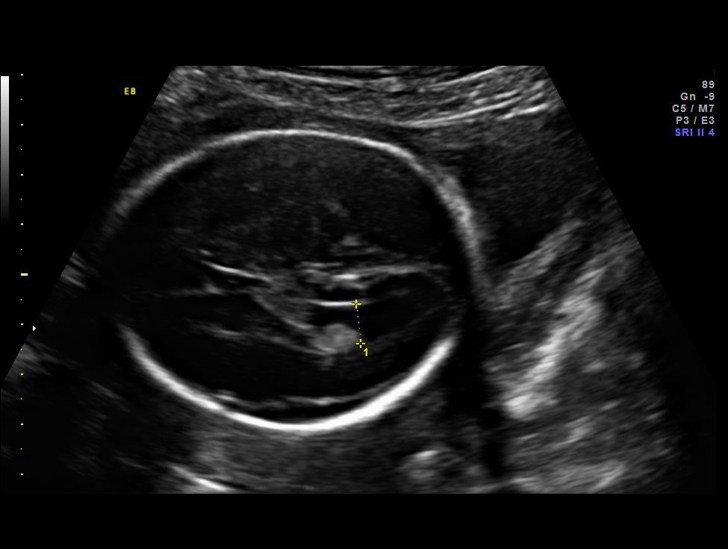
[im 17/45]
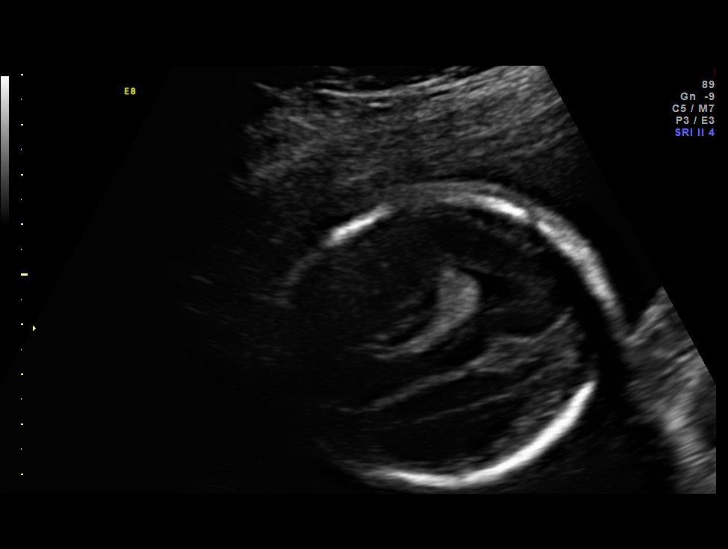
[im 20/45]
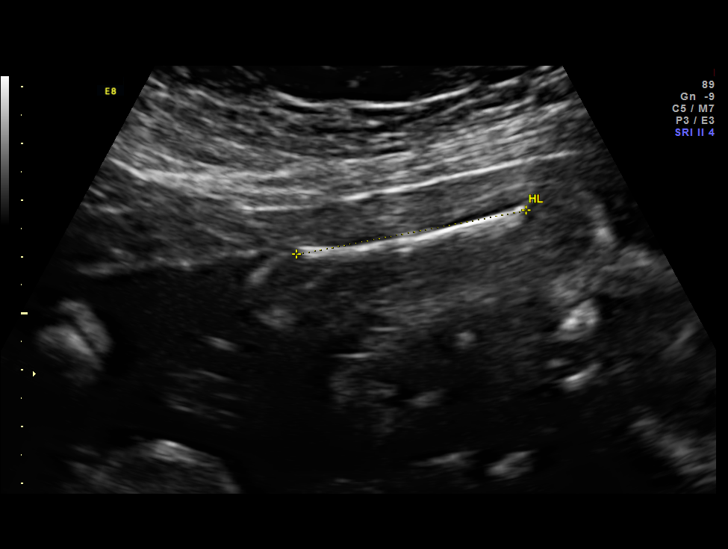
[im 25/45]
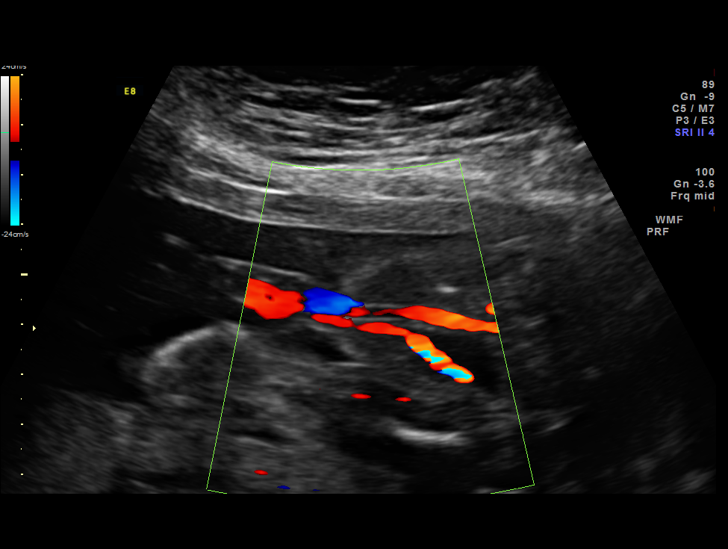
[im 28/45]
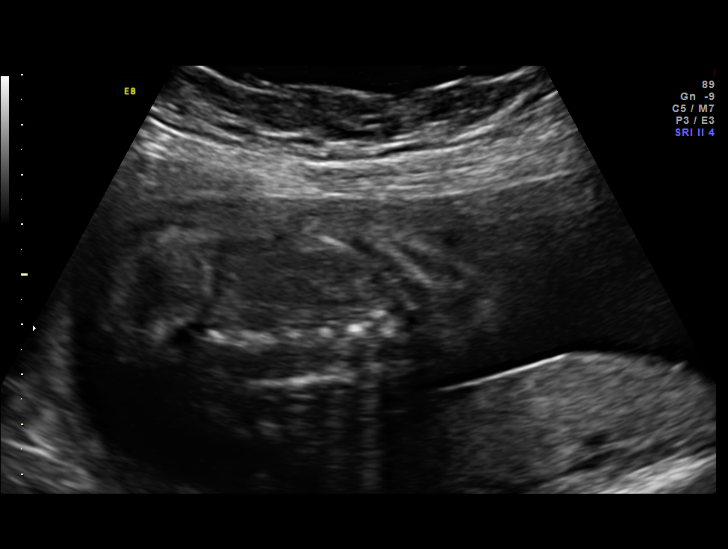
[im 31/45]
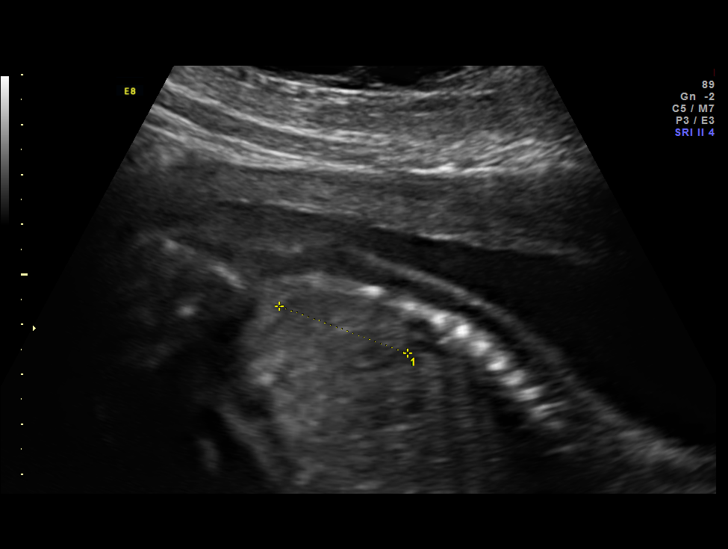
[im 36/45]
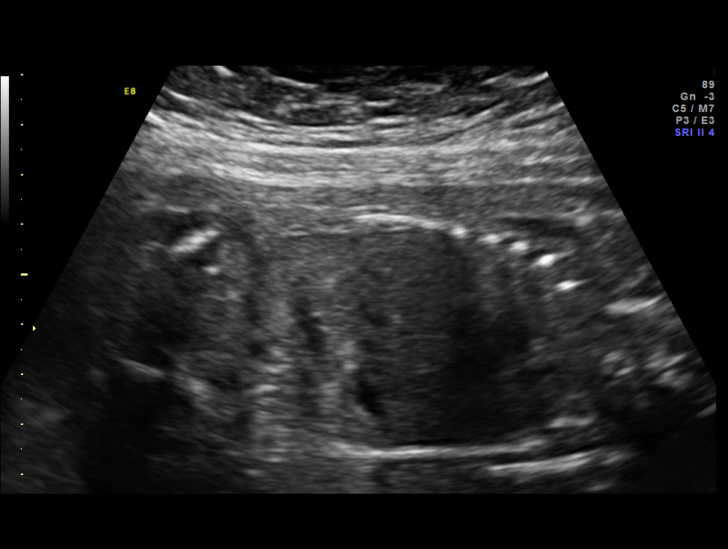
[im 40/45]
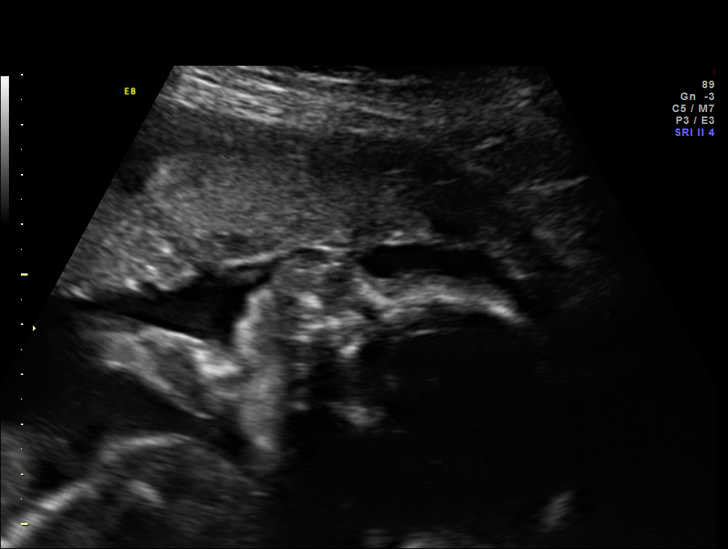
[im 43/45]
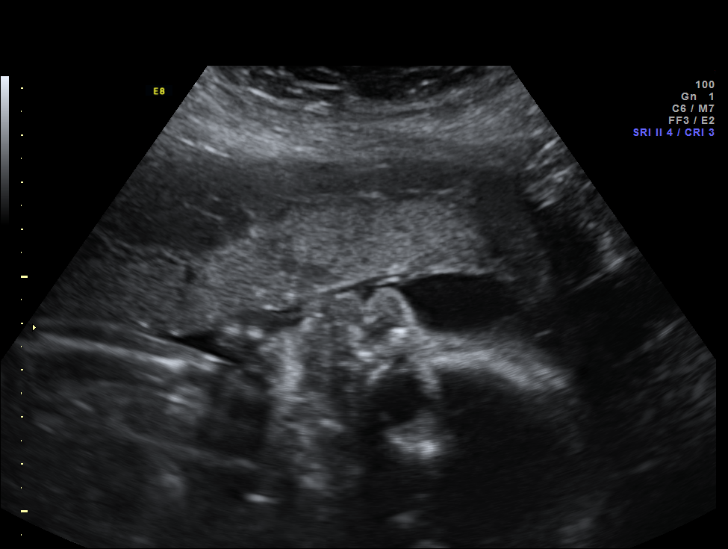

[12 of 28 positions shown; findings below may reference images not displayed]

OBSTETRICS REPORT
                      (Signed Final 07/03/2012 [DATE])

Service(s) Provided

 US OB FOLLOW UP                                       76816.1
Indications

 Echogenic bowel
Fetal Evaluation

 Num Of Fetuses:    1
 Fetal Heart Rate:  142                          bpm
 Cardiac Activity:  Observed
 Presentation:      Cephalic
 Placenta:          Posterior, above cervical
                    os
 P. Cord            Previously Visualized
 Insertion:

 Amniotic Fluid
 AFI FV:      Subjectively within normal limits
                                             Larg Pckt:     4.0  cm
Biometry

 BPD:     59.4  mm     G. Age:  24w 2d                CI:         76.0   70 - 86
 OFD:     78.2  mm                                    FL/HC:      21.0   18.7 -

 HC:     219.9  mm     G. Age:  24w 0d       19  %    HC/AC:      1.19   1.05 -

 AC:     185.4  mm     G. Age:  23w 2d       13  %    FL/BPD:     77.6   71 - 87
 FL:      46.1  mm     G. Age:  25w 2d       65  %    FL/AC:      24.9   20 - 24
 HUM:     41.6  mm     G. Age:  25w 1d       58  %

 Est. FW:     670  gm      1 lb 8 oz     47  %
Gestational Age

 LMP:           24w 3d        Date:  01/14/12                 EDD:   10/20/12
 U/S Today:     24w 2d                                        EDD:   10/21/12
 Best:          24w 3d     Det. By:  LMP  (01/14/12)          EDD:   10/20/12
Anatomy

 Cranium:          Appears normal         Aortic Arch:      Previously seen
 Fetal Cavum:      Appears normal         Ductal Arch:      Previously seen
 Ventricles:       Appears normal         Diaphragm:        Appears normal
 Choroid Plexus:   Previously seen        Stomach:          Appears normal, left
                                                            sided
 Cerebellum:       Previously seen        Abdomen:          Appears normal
 Posterior Fossa:  Previously seen        Abdominal Wall:   Appears nml (cord
                                                            insert, abd wall)
 Nuchal Fold:      Not applicable (>20    Cord Vessels:     Appears normal (3
                   wks GA)                                  vessel cord)
 Face:             Orbits and profile     Kidneys:          Appear normal
                   previously seen
 Lips:             Previously seen        Bladder:          Appears normal
 Heart:            Previously seen        Spine:            Previously seen
 RVOT:             Previously seen        Lower             Previously seen
                                          Extremities:
 LVOT:             Previously seen        Upper             Previously seen
                                          Extremities:

 Other:  Female gender. 5th digit previously seen.
Cervix Uterus Adnexa

 Cervical Length:    4        cm

 Cervix:       Normal appearance by transabdominal scan. Appears
               closed, without funnelling.
Comments

 Ms. Jhon Elver was seen today for follow up due to suspected
 echogenic bowel.  Her work up thus far has included a
 negative CF carrier screen and negative CMV serology.  The
 initial Toxo IgM was elevated, but the confirmatory test was
 not consistent with recent infection.  First trimester screen
 was low risk for aneuploidy, however, an elevated HCG was
 noted which may be associated with a higher risk for fetal
 growth restriction and preeclampsia.  NIPT returned low risk
 for aneuploidy.
Impression

 Single IUP at 24 [DATE] weeks
 Interval growth is appropriate (47th %tile)
 The fetal bowel appears prominent, but is not as echogenic
 as adjacent bone
 Otherwise normal interval anatomy
 Normal amniotic fluid volume
Recommendations

 Recommend follow-up ultrasound examination in 4 weeks for
 interval growth.

## 2014-08-21 IMAGING — US US OB FOLLOW-UP
1 series · 12 of 28 positions shown · non-contrast
Comparison: none

[Series 1: us ob follow-up · 0.26mm/px · 12 of 43 slices shown]
[im 2/43]
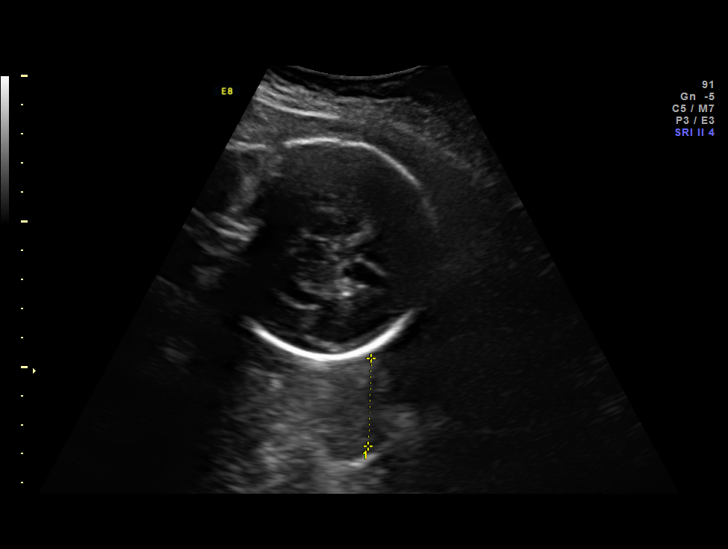
[im 5/43]
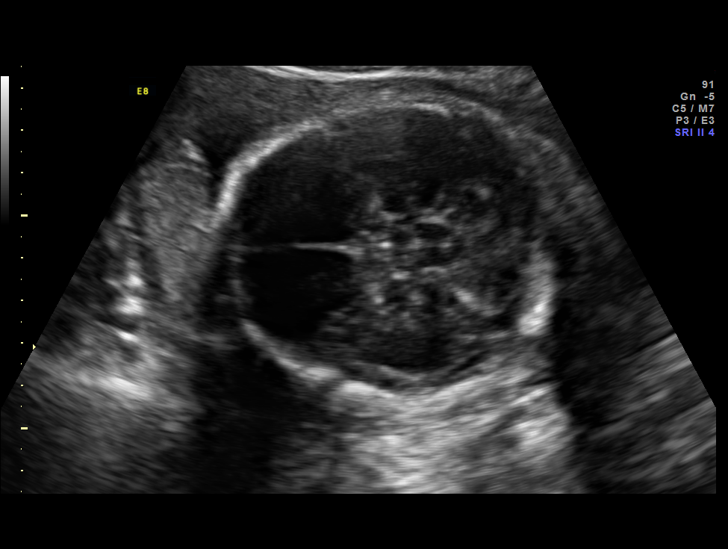
[im 8/43]
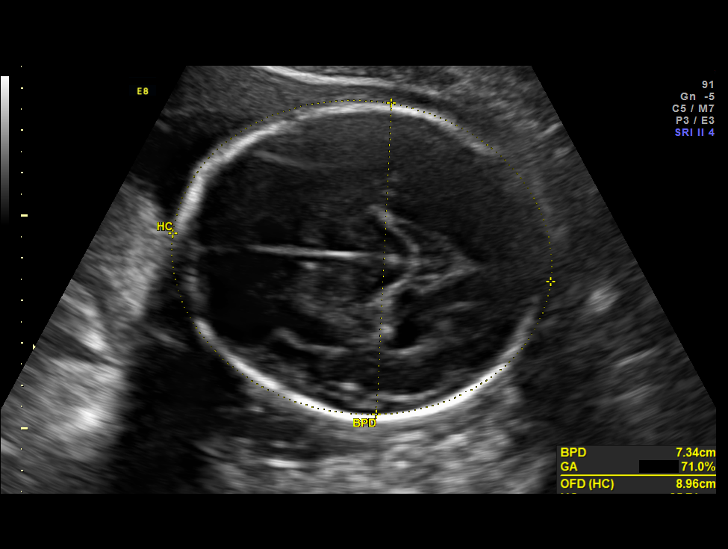
[im 13/43]
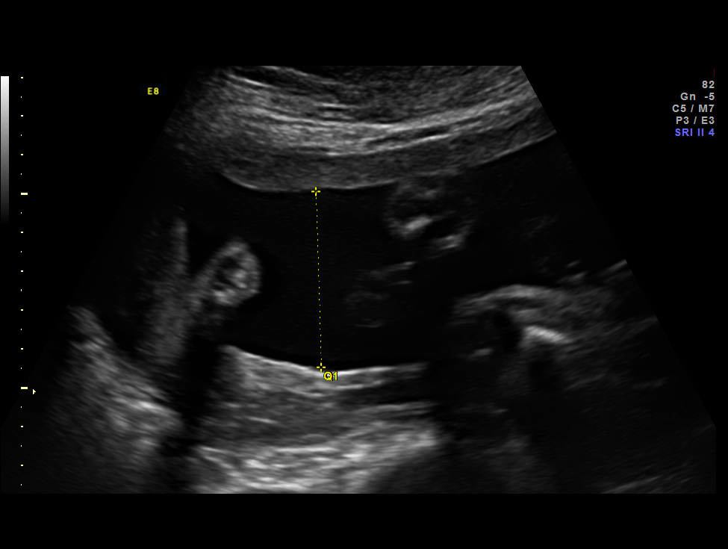
[im 16/43]
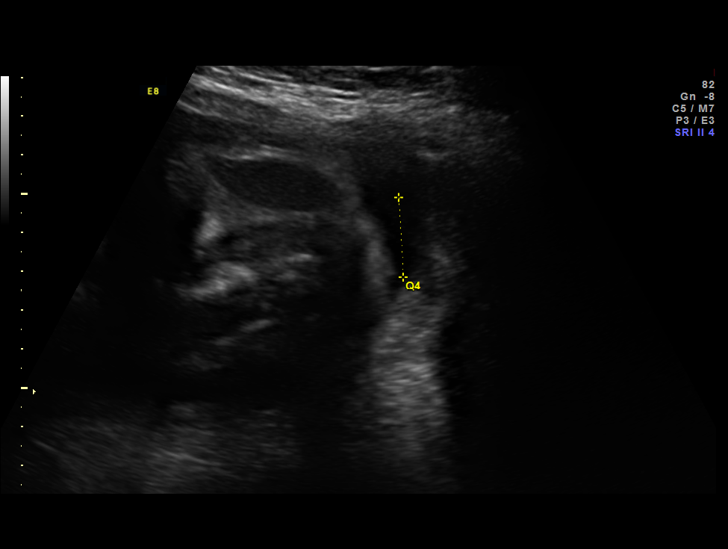
[im 19/43]
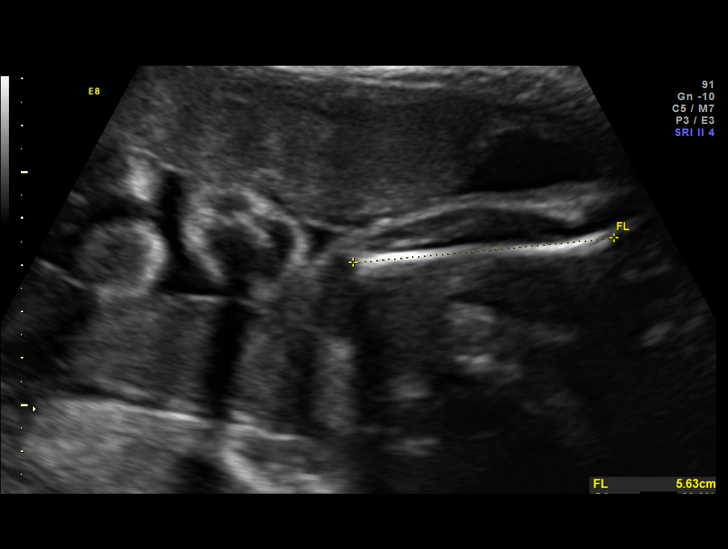
[im 24/43]
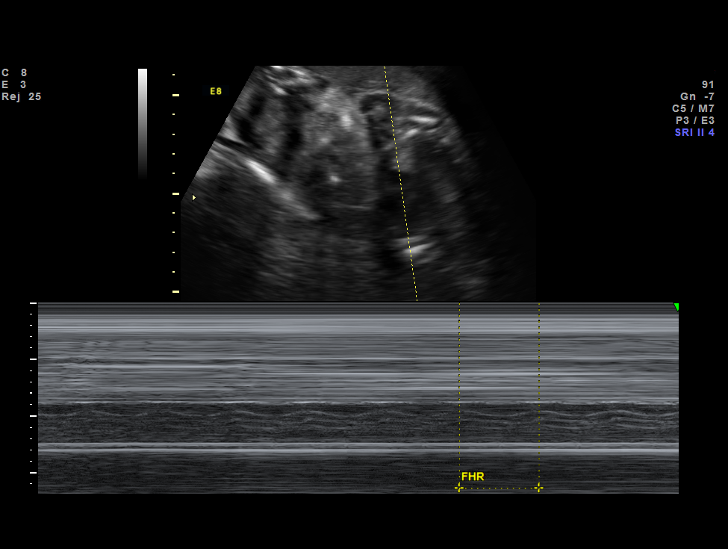
[im 27/43]
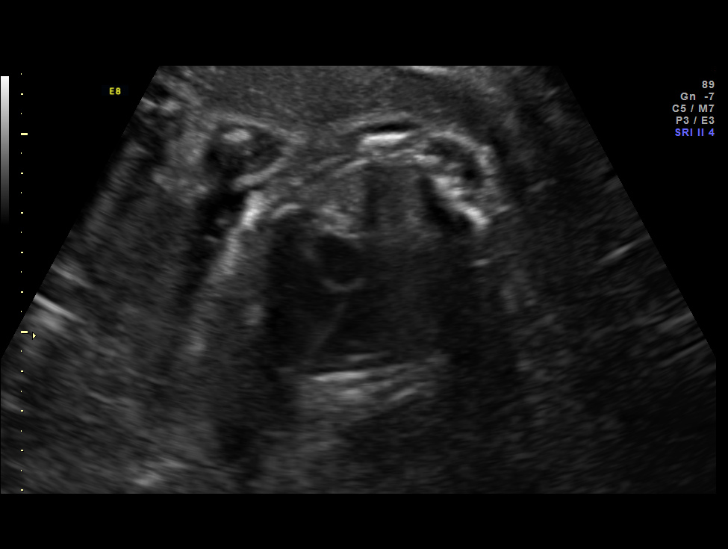
[im 30/43]
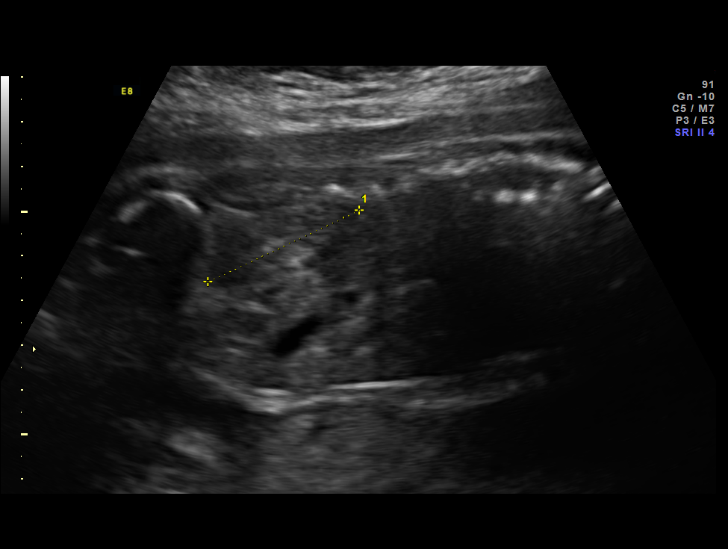
[im 35/43]
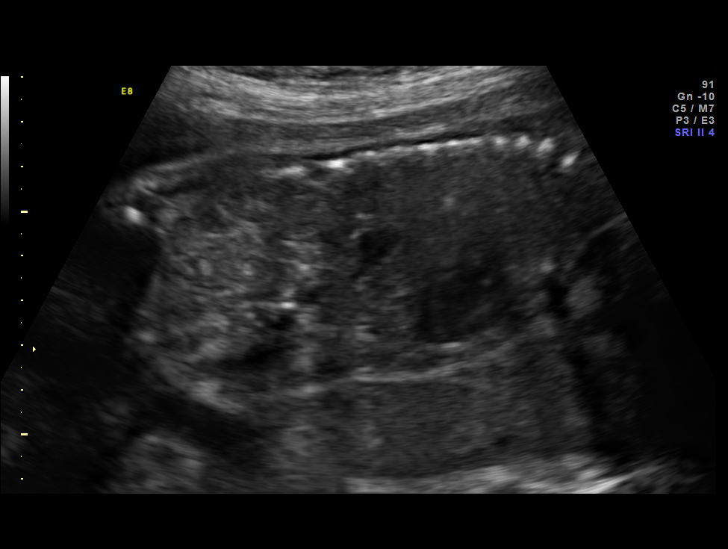
[im 38/43]
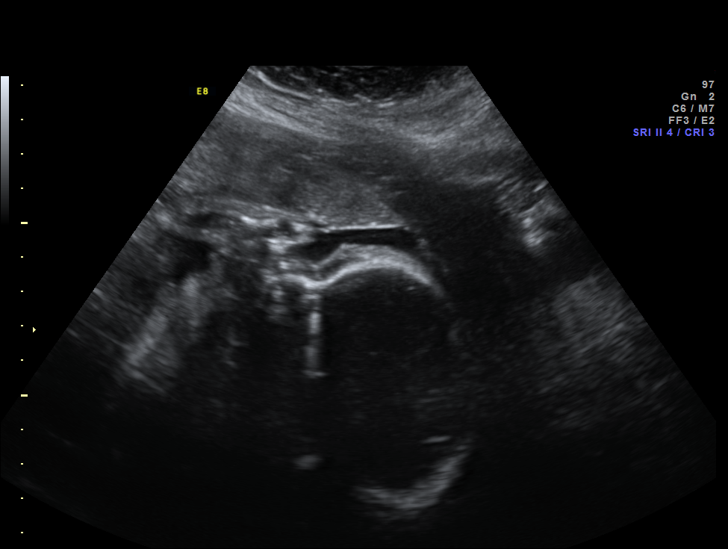
[im 41/43]
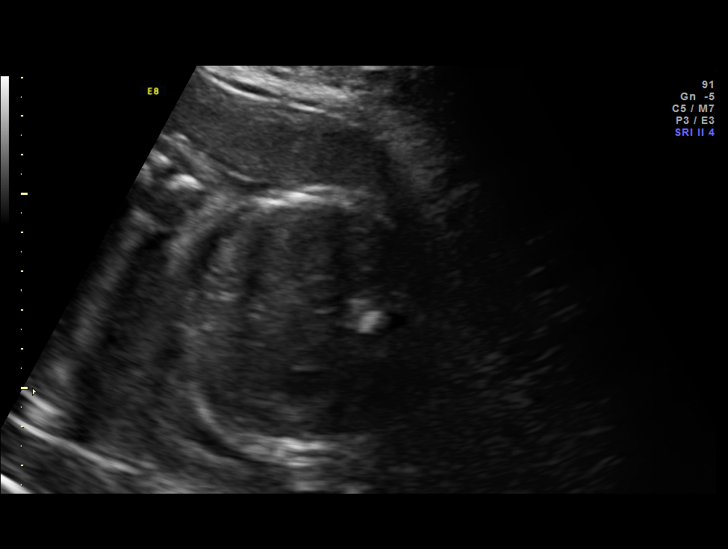

[12 of 28 positions shown; findings below may reference images not displayed]

OBSTETRICS REPORT
                      (Signed Final 07/31/2012 [DATE])

Service(s) Provided

 US OB FOLLOW UP                                       76816.1
Indications

 Echogenic bowel
Fetal Evaluation

 Num Of Fetuses:    1
 Fetal Heart Rate:  126                          bpm
 Cardiac Activity:  Observed
 Presentation:      Cephalic
 Placenta:          Posterior, above cervical
                    os
 P. Cord            Previously Visualized
 Insertion:

 Amniotic Fluid
 AFI FV:      Subjectively within normal limits
 AFI Sum:     11.32   cm       22  %Tile     Larg Pckt:    4.54  cm
 RUQ:   4.54    cm   RLQ:    2.06   cm    LUQ:   1.65    cm   LLQ:    3.07   cm
Biometry

 BPD:     73.4  mm     G. Age:  29w 3d                CI:         81.0   70 - 86
 OFD:     90.6  mm                                    FL/HC:      21.5   18.8 -

 HC:     258.2  mm     G. Age:  28w 0d       12  %    HC/AC:      1.10   1.05 -

 AC:     234.2  mm     G. Age:  27w 5d       24  %    FL/BPD:     75.7   71 - 87
 FL:      55.6  mm     G. Age:  29w 2d       60  %    FL/AC:      23.7   20 - 24
 HUM:     50.1  mm     G. Age:  29w 3d       64  %
 Est. FW:    7998  gm    2 lb 11 oz      51  %
Gestational Age

 LMP:           28w 3d        Date:  01/14/12                 EDD:   10/20/12
 U/S Today:     28w 4d                                        EDD:   10/19/12
 Best:          28w 3d     Det. By:  LMP  (01/14/12)          EDD:   10/20/12
Anatomy
 Cranium:          Appears normal         Aortic Arch:      Previously seen
 Fetal Cavum:      Appears normal         Ductal Arch:      Previously seen
 Ventricles:       Appears normal         Diaphragm:        Previously seen
 Choroid Plexus:   Previously seen        Stomach:          Appears normal, left
                                                            sided
 Cerebellum:       Appears normal         Abdomen:          Previously seen
 Posterior Fossa:  Appears normal         Abdominal Wall:   Previously seen
 Nuchal Fold:      Not applicable (>20    Cord Vessels:     Previously seen
                   wks GA)
 Face:             Orbits and profile     Kidneys:          Appear normal
                   previously seen
 Lips:             Previously seen        Bladder:          Appears normal
 Heart:            Appears normal         Spine:            Previously seen
                   (4CH, axis, and
                   situs)
 RVOT:             Previously seen        Lower             Previously seen
                                          Extremities:
 LVOT:             Previously seen        Upper             Previously seen
                                          Extremities:

 Other:  Female gender. 5th digit previously seen. Heels appears normal.
Cervix Uterus Adnexa

 Cervical Length:    3        cm

 Cervix:       Normal appearance by transabdominal scan. Appears
               closed, without funnelling.
Comments

 Ms. Rafko was seen today for follow up due to echogenic
 bowel.  Her work up thus far has included a negative CF
 carrier screen and negative CMV serology.  The initial Toxo
 IgM was elevated, but the confirmatory test was not
 consistent with recent infection.  First trimester screen was
 low risk for aneuploidy, however, an elevated HCG was noted
 which may be associated with a higher risk for fetal growth
 restriction and preeclampsia.  NIPT returned low risk for
 aneuploidy.
Impression

 Single IUP at 28 [DATE] weeks
 Interval growth is appropriate (51st %tile)
 The fetal bowel appears prominent, but is not as echogenic
 as adjacent bone
 Otherwise normal interval anatomy
 Normal amniotic fluid volume
Recommendations

 Recommend follow-up ultrasound examination in 4 weeks for
 interval growth - please contact our office if you would prefer
 to have this procedure done here.

## 2015-05-18 NOTE — L&D Delivery Note (Signed)
Delivery Note Patient pushed for less than 20 minutes after she was noted to be C/C/+2. At 3:30 PM a viable and healthy female was delivered over intact perineum via Vaginal, Spontaneous Delivery (Presentation: Left Occiput Anterior).  APGAR: 8, 9; weight  Pending.  Baby placed on maternal abdomen and the cord was left attached until pulse was gone.  The cord was double clamped and cut by the father.  Cord banking blood was obtained in routine fashion, then cord blood for typing. The placenta was spontaneously delivered intact.  Cord: 3 vessels with the following complications: None.     Anesthesia: Epidural  Episiotomy: None Lacerations: 2nd degree vaginal and b/l Labial Suture Repair: 2.0 vicryl and  3.0 chromic  Est. Blood Loss (mL): 400  Mom to postpartum.  Baby to Couplet care / Skin to Skin.  Essie HartINN, Kimberely Mccannon STACIA 10/08/2015, 4:04 PM

## 2015-07-11 ENCOUNTER — Ambulatory Visit: Payer: Self-pay | Admitting: Primary Care

## 2015-07-15 ENCOUNTER — Encounter: Payer: Self-pay | Admitting: Family Medicine

## 2015-07-15 ENCOUNTER — Ambulatory Visit (INDEPENDENT_AMBULATORY_CARE_PROVIDER_SITE_OTHER): Payer: 59 | Admitting: Family Medicine

## 2015-07-15 VITALS — BP 106/64 | HR 79 | Temp 98.2°F | Ht 62.5 in | Wt 167.0 lb

## 2015-07-15 DIAGNOSIS — O26812 Pregnancy related exhaustion and fatigue, second trimester: Secondary | ICD-10-CM | POA: Diagnosis not present

## 2015-07-15 DIAGNOSIS — R5383 Other fatigue: Secondary | ICD-10-CM | POA: Insufficient documentation

## 2015-07-15 NOTE — Progress Notes (Signed)
Pre visit review using our clinic review tool, if applicable. No additional management support is needed unless otherwise documented below in the visit note. 

## 2015-07-15 NOTE — Progress Notes (Signed)
Subjective:    Patient ID: Shawna Craig, female    DOB: 06/02/1985, 30 y.o.   MRN: [id number]  HPI Here for concerns of anemia with pregnancy   Her husband thinks she is anemic  She wanted to be tested  Needs labs to [facility]   Is 26 weeks tomorrow  Very tired overall  More than last pregnancy - but also has a child at home  Not enough sleep  Starting to have discomfort at night    Feels pretty good overall  Was exp to the flu - had 2 d of symptoms - not bad  Had shot the fall   Has appt thurs with gyn  She has frequent urination  Doing the GTT on Thursday -concerned about that   Had one night of low abd cramping/ no vaginal bleeding and it got better   Patient Active Problem List   Diagnosis Date Noted  . Acute sinusitis 09/24/2013   No past medical history on file. Past Surgical History  Procedure Laterality Date  . Tonsillectomy and adenoidectomy      2007   Social History  Substance Use Topics  . Smoking status: Never Smoker   . Smokeless tobacco: None  . Alcohol Use: No   No family history on file. Allergies  Allergen Reactions  . Penicillins Hives   No current outpatient prescriptions on file prior to visit.   No current facility-administered medications on file prior to visit.    Review of Systems Review of Systems  Constitutional: Negative for fever, appetite change,  and unexpected weight change.  Eyes: Negative for pain and visual disturbance.  Respiratory: Negative for cough and shortness of breath.   Cardiovascular: Negative for cp or palpitations    Gastrointestinal: Negative for nausea, diarrhea and constipation.  Genitourinary: Negative for urgency and frequency. neg for excessive thirst  Skin: Negative for pallor or rash   Neurological: Negative for weakness, light-headedness, numbness and headaches.  Hematological: Negative for adenopathy. Does not bruise/bleed easily.  Psychiatric/Behavioral: Negative for dysphoric mood. The  patient is not nervous/anxious.         Objective:   Physical Exam  Constitutional: She appears well-developed and well-nourished. No distress.  Well appearing   HENT:  Head: Normocephalic and atraumatic.  Mouth/Throat: Oropharynx is clear and moist.  Eyes: Conjunctivae and EOM are normal. Pupils are equal, round, and reactive to light. No scleral icterus.  Neck: Normal range of motion. Neck supple.  Cardiovascular: Normal rate, regular rhythm and normal heart sounds.   Pulmonary/Chest: Effort normal and breath sounds normal. No respiratory distress. She has no wheezes. She has no rales.  Abdominal: Soft. Bowel sounds are normal. She exhibits no distension and no mass. There is no tenderness. There is no rebound and no guarding.  No tenderness  Palpable uterus corresp to dates   Lymphadenopathy:    She has no cervical adenopathy.  Neurological: She is alert.  Skin: Skin is warm and dry. No erythema. No pallor.  Fair complexion without pallor  Psychiatric: She has a normal mood and affect.          Assessment & Plan:   Problem List Items Addressed This Visit      Other   Fatigue - Primary    During pregnancy  Interested in checking cbc and tsh - but has to go to [facility] (cannot do at gyn) Will send this Re assuring exam      Relevant Orders   TSH (Completed)  CBC with Differential/Platelet (Completed)   Ferritin (Completed)

## 2015-07-15 NOTE — Patient Instructions (Signed)
Take care of yourself  Eat and drink regularly  Take your prenatal vitamin daily (check to see if it has iron in it)

## 2015-07-16 ENCOUNTER — Encounter: Payer: Self-pay | Admitting: *Deleted

## 2015-07-16 LAB — CBC WITH DIFFERENTIAL/PLATELET
BASOS ABS: 0 10*3/uL (ref 0.0–0.2)
Basos: 0 %
EOS (ABSOLUTE): 0.2 10*3/uL (ref 0.0–0.4)
Eos: 2 %
HEMOGLOBIN: 12 g/dL (ref 11.1–15.9)
Hematocrit: 35.2 % (ref 34.0–46.6)
IMMATURE GRANS (ABS): 0.1 10*3/uL (ref 0.0–0.1)
Immature Granulocytes: 1 %
LYMPHS: 27 %
Lymphocytes Absolute: 2.6 10*3/uL (ref 0.7–3.1)
MCH: 30.6 pg (ref 26.6–33.0)
MCHC: 34.1 g/dL (ref 31.5–35.7)
MCV: 90 fL (ref 79–97)
MONOCYTES: 6 %
Monocytes Absolute: 0.5 10*3/uL (ref 0.1–0.9)
NEUTROS ABS: 6.2 10*3/uL (ref 1.4–7.0)
Neutrophils: 64 %
PLATELETS: 216 10*3/uL (ref 150–379)
RBC: 3.92 x10E6/uL (ref 3.77–5.28)
RDW: 13 % (ref 12.3–15.4)
WBC: 9.5 10*3/uL (ref 3.4–10.8)

## 2015-07-16 LAB — TSH: TSH: 1.71 u[IU]/mL (ref 0.450–4.500)

## 2015-07-16 LAB — FERRITIN: FERRITIN: 34 ng/mL (ref 15–150)

## 2015-07-17 NOTE — Assessment & Plan Note (Signed)
During pregnancy  Interested in checking cbc and tsh - but has to go to [facility] (cannot do at gyn) Will send this Re assuring exam

## 2015-10-02 ENCOUNTER — Ambulatory Visit (INDEPENDENT_AMBULATORY_CARE_PROVIDER_SITE_OTHER): Payer: 59 | Admitting: Family Medicine

## 2015-10-02 ENCOUNTER — Encounter: Payer: Self-pay | Admitting: Family Medicine

## 2015-10-02 VITALS — BP 108/62 | HR 86 | Temp 97.7°F | Wt 178.5 lb

## 2015-10-02 DIAGNOSIS — J301 Allergic rhinitis due to pollen: Secondary | ICD-10-CM | POA: Diagnosis not present

## 2015-10-02 NOTE — Progress Notes (Signed)
SUBJECTIVE:  Shawna Craig is a 30 y.o. female who is [redacted] weeks pregnant, who complains of coryza, congestion and sneezing for 20 days. She denies a history of anorexia and chest pain and denies a history of asthma. Patient denies smoke cigarettes.   No current outpatient prescriptions on file prior to visit.   No current facility-administered medications on file prior to visit.    Allergies  Allergen Reactions  . Penicillins Hives    No past medical history on file.  Past Surgical History  Procedure Laterality Date  . Tonsillectomy and adenoidectomy      2007    No family history on file.  Social History   Social History  . Marital Status: Married    Spouse Name: Onalee HuaDavid  . Number of Children: 1  . Years of Education: N/A   Occupational History  . exercise teacher  [employer]   Social History Main Topics  . Smoking status: Never Smoker   . Smokeless tobacco: Not on file  . Alcohol Use: No  . Drug Use: No  . Sexual Activity: Not on file   Other Topics Concern  . Not on file   Social History Narrative   Pt is married (Dr Karle StarchLetvak's daughter in law) , and has a 186 mo old daughter   Stays home and also teaches exercise classes at [employer]    Plans on finishing a degree on line    The PMH, PSH, Social History, Family History, Medications, and allergies [facility], and have been updated if relevant.  OBJECTIVE: BP 108/62 mmHg  Pulse 86  Temp(Src) 97.7 F (36.5 C) (Oral)  Wt 178 lb 8 oz (80.967 kg)  SpO2 98%  She appears well, vital signs are as noted. Ears normal.  Throat and pharynx normal.  Neck supple. No adenopathy in the neck. Nose is congested. Sinuses non tender. The chest is clear, without wheezes or rales.  ASSESSMENT:  Allergic rhinitis  PLAN: Symptomatic therapy suggested: push fluids, rest, return office visit prn if symptoms persist or worsen and sudafed as needed. Lack of antibiotic effectiveness discussed with her. Call or return to  clinic prn if these symptoms worsen or fail to improve as anticipated.

## 2015-10-02 NOTE — Progress Notes (Signed)
Pre visit review using our clinic review tool, if applicable. No additional management support is needed unless otherwise documented below in the visit note. 

## 2015-10-08 ENCOUNTER — Inpatient Hospital Stay (HOSPITAL_COMMUNITY)
Admission: AD | Admit: 2015-10-08 | Discharge: 2015-10-09 | DRG: 775 | Disposition: A | Discharge status: Home / Self Care | Payer: 59 | Source: Ambulatory Visit | Attending: Obstetrics & Gynecology | Admitting: Obstetrics & Gynecology

## 2015-10-08 ENCOUNTER — Inpatient Hospital Stay (HOSPITAL_COMMUNITY): Payer: 59 | Admitting: Anesthesiology

## 2015-10-08 ENCOUNTER — Encounter (HOSPITAL_COMMUNITY): Payer: Self-pay

## 2015-10-08 DIAGNOSIS — O99824 Streptococcus B carrier state complicating childbirth: Secondary | ICD-10-CM | POA: Diagnosis present

## 2015-10-08 DIAGNOSIS — Z9889 Other specified postprocedural states: Secondary | ICD-10-CM

## 2015-10-08 DIAGNOSIS — Z3A38 38 weeks gestation of pregnancy: Secondary | ICD-10-CM

## 2015-10-08 DIAGNOSIS — O4290 Premature rupture of membranes, unspecified as to length of time between rupture and onset of labor, unspecified weeks of gestation: Secondary | ICD-10-CM | POA: Diagnosis present

## 2015-10-08 LAB — CBC
HCT: 36.1 % (ref 36.0–46.0)
Hemoglobin: 12.2 g/dL (ref 12.0–15.0)
MCH: 29.5 pg (ref 26.0–34.0)
MCHC: 33.8 g/dL (ref 30.0–36.0)
MCV: 87.2 fL (ref 78.0–100.0)
PLATELETS: 257 10*3/uL (ref 150–400)
RBC: 4.14 MIL/uL (ref 3.87–5.11)
RDW: 13.3 % (ref 11.5–15.5)
WBC: 10.7 10*3/uL — AB (ref 4.0–10.5)

## 2015-10-08 LAB — POCT FERN TEST: POCT FERN TEST: POSITIVE

## 2015-10-08 LAB — TYPE AND SCREEN
ABO/RH(D): A POS
ANTIBODY SCREEN: NEGATIVE

## 2015-10-08 LAB — RPR: RPR Ser Ql: NONREACTIVE

## 2015-10-08 MED ORDER — OXYTOCIN BOLUS FROM INFUSION
500.0000 mL | INTRAVENOUS | Status: DC
  Administered 2015-10-08: 500 mL via INTRAVENOUS

## 2015-10-08 MED ORDER — ACETAMINOPHEN 325 MG PO TABS
650.0000 mg | ORAL_TABLET | ORAL | Status: DC | PRN
  Filled 2015-10-08: qty 2

## 2015-10-08 MED ORDER — COCONUT OIL OIL: 1.0000 "application " | TOPICAL_OIL | Status: DC | PRN

## 2015-10-08 MED ORDER — DIPHENHYDRAMINE HCL 25 MG PO CAPS: 25.0000 mg | ORAL_CAPSULE | Freq: Four times a day (QID) | ORAL | Status: DC | PRN

## 2015-10-08 MED ORDER — OXYTOCIN 40 UNITS IN LACTATED RINGERS INFUSION - SIMPLE MED: 2.5000 [IU]/h | INTRAVENOUS | Status: DC

## 2015-10-08 MED ORDER — OXYCODONE-ACETAMINOPHEN 5-325 MG PO TABS
2.0000 | ORAL_TABLET | ORAL | Status: DC | PRN
Start: 2015-10-08 — End: 2015-10-09

## 2015-10-08 MED ORDER — OXYCODONE-ACETAMINOPHEN 5-325 MG PO TABS
1.0000 | ORAL_TABLET | ORAL | Status: DC | PRN
Start: 2015-10-08 — End: 2015-10-09

## 2015-10-08 MED ORDER — DIBUCAINE 1 % RE OINT: 1.0000 "application " | TOPICAL_OINTMENT | RECTAL | Status: DC | PRN

## 2015-10-08 MED ORDER — OXYCODONE-ACETAMINOPHEN 5-325 MG PO TABS: 2.0000 | ORAL_TABLET | ORAL | Status: DC | PRN

## 2015-10-08 MED ORDER — LIDOCAINE HCL (PF) 1 % IJ SOLN
INTRAMUSCULAR | Status: DC | PRN
  Administered 2015-10-08 (×2): 7 mL via EPIDURAL

## 2015-10-08 MED ORDER — CLINDAMYCIN PHOSPHATE 900 MG/50ML IV SOLN
900.0000 mg | Freq: Three times a day (TID) | INTRAVENOUS | Status: DC
  Administered 2015-10-08: 900 mg via INTRAVENOUS
  Filled 2015-10-08 (×2): qty 50

## 2015-10-08 MED ORDER — VANCOMYCIN HCL IN DEXTROSE 1-5 GM/200ML-% IV SOLN
1000.0000 mg | Freq: Two times a day (BID) | INTRAVENOUS | Status: DC
  Administered 2015-10-08: 1000 mg via INTRAVENOUS
  Filled 2015-10-08 (×2): qty 200

## 2015-10-08 MED ORDER — EPHEDRINE 5 MG/ML INJ
10.0000 mg | INTRAVENOUS | Status: DC | PRN
  Filled 2015-10-08: qty 2

## 2015-10-08 MED ORDER — PHENYLEPHRINE 40 MCG/ML (10ML) SYRINGE FOR IV PUSH (FOR BLOOD PRESSURE SUPPORT)
80.0000 ug | PREFILLED_SYRINGE | INTRAVENOUS | Status: DC | PRN
  Filled 2015-10-08: qty 5

## 2015-10-08 MED ORDER — OXYTOCIN 40 UNITS IN LACTATED RINGERS INFUSION - SIMPLE MED: 2.5000 [IU]/h | INTRAVENOUS | Status: DC | PRN

## 2015-10-08 MED ORDER — OXYTOCIN 40 UNITS IN LACTATED RINGERS INFUSION - SIMPLE MED
1.0000 m[IU]/min | INTRAVENOUS | Status: DC
  Administered 2015-10-08: 2 m[IU]/min via INTRAVENOUS
  Filled 2015-10-08: qty 1000

## 2015-10-08 MED ORDER — CITRIC ACID-SODIUM CITRATE 334-500 MG/5ML PO SOLN
30.0000 mL | ORAL | Status: DC | PRN
  Filled 2015-10-08: qty 30

## 2015-10-08 MED ORDER — OXYTOCIN BOLUS FROM INFUSION: 500.0000 mL | INTRAVENOUS | Status: DC

## 2015-10-08 MED ORDER — LACTATED RINGERS IV SOLN
500.0000 mL | INTRAVENOUS | Status: DC | PRN
  Administered 2015-10-08: 1000 mL via INTRAVENOUS

## 2015-10-08 MED ORDER — LIDOCAINE HCL (PF) 1 % IJ SOLN
30.0000 mL | INTRAMUSCULAR | Status: DC | PRN
  Filled 2015-10-08: qty 30

## 2015-10-08 MED ORDER — OXYCODONE-ACETAMINOPHEN 5-325 MG PO TABS: 1.0000 | ORAL_TABLET | ORAL | Status: DC | PRN

## 2015-10-08 MED ORDER — ONDANSETRON HCL 4 MG/2ML IJ SOLN: 4.0000 mg | INTRAMUSCULAR | Status: DC | PRN

## 2015-10-08 MED ORDER — ZOLPIDEM TARTRATE 5 MG PO TABS: 5.0000 mg | ORAL_TABLET | Freq: Every evening | ORAL | Status: DC | PRN

## 2015-10-08 MED ORDER — PHENYLEPHRINE 40 MCG/ML (10ML) SYRINGE FOR IV PUSH (FOR BLOOD PRESSURE SUPPORT)
PREFILLED_SYRINGE | INTRAVENOUS | Status: AC
  Filled 2015-10-08: qty 20

## 2015-10-08 MED ORDER — ACETAMINOPHEN 325 MG PO TABS
650.0000 mg | ORAL_TABLET | ORAL | Status: DC | PRN
  Administered 2015-10-09: 650 mg via ORAL

## 2015-10-08 MED ORDER — BENZOCAINE-MENTHOL 20-0.5 % EX AERO
1.0000 "application " | INHALATION_SPRAY | CUTANEOUS | Status: DC | PRN
  Administered 2015-10-09: 1 via TOPICAL
  Filled 2015-10-08: qty 56

## 2015-10-08 MED ORDER — WITCH HAZEL-GLYCERIN EX PADS: 1.0000 "application " | MEDICATED_PAD | CUTANEOUS | Status: DC | PRN

## 2015-10-08 MED ORDER — IBUPROFEN 600 MG PO TABS
600.0000 mg | ORAL_TABLET | Freq: Four times a day (QID) | ORAL | Status: DC
  Administered 2015-10-08 – 2015-10-09 (×4): 600 mg via ORAL
  Filled 2015-10-08 (×4): qty 1

## 2015-10-08 MED ORDER — BUTORPHANOL TARTRATE 1 MG/ML IJ SOLN
1.0000 mg | INTRAMUSCULAR | Status: DC | PRN
Start: 2015-10-08 — End: 2015-10-09

## 2015-10-08 MED ORDER — LACTATED RINGERS IV SOLN
500.0000 mL | INTRAVENOUS | Status: DC | PRN
  Administered 2015-10-08 (×2): 1000 mL via INTRAVENOUS

## 2015-10-08 MED ORDER — SENNOSIDES-DOCUSATE SODIUM 8.6-50 MG PO TABS
2.0000 | ORAL_TABLET | ORAL | Status: DC
  Administered 2015-10-09: 2 via ORAL
  Filled 2015-10-08: qty 2

## 2015-10-08 MED ORDER — SIMETHICONE 80 MG PO CHEW: 80.0000 mg | CHEWABLE_TABLET | ORAL | Status: DC | PRN

## 2015-10-08 MED ORDER — DIPHENHYDRAMINE HCL 50 MG/ML IJ SOLN: 12.5000 mg | INTRAMUSCULAR | Status: DC | PRN

## 2015-10-08 MED ORDER — LACTATED RINGERS IV SOLN: 500.0000 mL | Freq: Once | INTRAVENOUS | Status: DC

## 2015-10-08 MED ORDER — PHENYLEPHRINE 40 MCG/ML (10ML) SYRINGE FOR IV PUSH (FOR BLOOD PRESSURE SUPPORT)
80.0000 ug | PREFILLED_SYRINGE | INTRAVENOUS | Status: DC | PRN
  Administered 2015-10-08 (×2): 80 ug via INTRAVENOUS
  Filled 2015-10-08: qty 5

## 2015-10-08 MED ORDER — TERBUTALINE SULFATE 1 MG/ML IJ SOLN: 0.2500 mg | Freq: Once | INTRAMUSCULAR | Status: DC | PRN

## 2015-10-08 MED ORDER — ACETAMINOPHEN 325 MG PO TABS
650.0000 mg | ORAL_TABLET | ORAL | Status: DC | PRN
  Administered 2015-10-09: 650 mg via ORAL
  Filled 2015-10-08: qty 2

## 2015-10-08 MED ORDER — LACTATED RINGERS IV SOLN: INTRAVENOUS | Status: DC

## 2015-10-08 MED ORDER — ONDANSETRON HCL 4 MG/2ML IJ SOLN: 4.0000 mg | Freq: Four times a day (QID) | INTRAMUSCULAR | Status: DC | PRN

## 2015-10-08 MED ORDER — TETANUS-DIPHTH-ACELL PERTUSSIS 5-2.5-18.5 LF-MCG/0.5 IM SUSP
0.5000 mL | Freq: Once | INTRAMUSCULAR | Status: DC
Start: 2015-10-09 — End: 2015-10-09

## 2015-10-08 MED ORDER — FENTANYL 2.5 MCG/ML BUPIVACAINE 1/10 % EPIDURAL INFUSION (WH - ANES)
14.0000 mL/h | INTRAMUSCULAR | Status: DC | PRN
  Administered 2015-10-08: 14 mL/h via EPIDURAL

## 2015-10-08 MED ORDER — LACTATED RINGERS IV SOLN
INTRAVENOUS | Status: DC
  Administered 2015-10-08 (×2): via INTRAVENOUS

## 2015-10-08 MED ORDER — FENTANYL 2.5 MCG/ML BUPIVACAINE 1/10 % EPIDURAL INFUSION (WH - ANES)
INTRAMUSCULAR | Status: DC
Start: 2015-10-08 — End: 2015-10-08
  Filled 2015-10-08: qty 125

## 2015-10-08 MED ORDER — ONDANSETRON HCL 4 MG PO TABS: 4.0000 mg | ORAL_TABLET | ORAL | Status: DC | PRN

## 2015-10-08 MED ORDER — PRENATAL MULTIVITAMIN CH
1.0000 | ORAL_TABLET | Freq: Every day | ORAL | Status: DC
  Administered 2015-10-09: 1 via ORAL
  Filled 2015-10-08: qty 1

## 2015-10-08 NOTE — H&P (Signed)
Shawna Shawna Craig is a 30 y.o. female presenting for rupture of membranes at 05:30 am. Patient with rare ctx, no vaginal bleeding, normal FM.  Prenatal course uncomplicated  Maternal Medical History:  Reason for admission: Rupture of membranes.  Nausea.  Contractions: Onset was 1-2 hours ago.   Frequency: regular.   Duration is approximately 60 seconds.   Perceived severity is mild.    Fetal activity: Perceived fetal activity is normal.   Last perceived fetal movement was within the past 12 hours.    Prenatal complications: no prenatal complications Prenatal Complications - Diabetes: none.    OB History    Gravida Para Term Preterm AB TAB SAB Ectopic Multiple Living   2 1 1  0 0 0 0 0 0 1     History reviewed. No pertinent past medical history. Past Surgical History  Procedure Laterality Date  . Tonsillectomy and adenoidectomy      2007  . Wisdom tooth extraction     Family History: family history is not on file. Social History:  reports that she has never smoked. She does not have any smokeless tobacco history on file. She reports that she does not drink alcohol or use illicit drugs.   Prenatal Transfer Tool  Maternal Diabetes: No Genetic Screening: Declined Maternal Ultrasounds/Referrals: Normal Fetal Ultrasounds or other Referrals:  Other: anatomy scan normal Maternal Substance Abuse:  No Significant Maternal Medications:  None Significant Maternal Lab Results:  Lab values include: Group B Strep positive Other Comments:  None  Review of Systems  Constitutional: Negative for fever and chills.  HENT: Negative for hearing loss.   Eyes: Negative for blurred vision and double vision.  Respiratory: Negative for cough.   Cardiovascular: Negative for chest pain and palpitations.  Gastrointestinal: Negative for heartburn and nausea.  Genitourinary: Negative for dysuria.  Skin: Negative for itching and rash.  Neurological: Negative for dizziness, tingling and  headaches.  Endo/Heme/Allergies: Negative for environmental allergies. Does not bruise/bleed easily.  Psychiatric/Behavioral: Negative for depression and suicidal ideas.  All other systems reviewed and are negative.   Dilation: 1 Effacement (%): Thick Exam by:: Amber Stovall RN Temperature 98.1 F (36.7 C). Maternal Exam:  Uterine Assessment: Contraction strength is mild.  Contraction duration is 50 seconds. Contraction frequency is irregular.   Abdomen: Patient reports no abdominal tenderness. Fundal height is 38 cm.   Estimated fetal weight is 2900 grams.   Fetal presentation: vertex  Introitus: Normal vulva. Normal vagina.  Ferning test: positive.  Nitrazine test: positive. Amniotic fluid character: clear.  Pelvis: adequate for delivery.   Cervix: Cervix evaluated by digital exam.   1cm / thick / posterior  Fetal Exam Fetal Monitor Review: Baseline rate: 130.  Variability: moderate (6-25 bpm).   Pattern: no decelerations and accelerations present.    Fetal State Assessment: Category I - tracings are normal.     Physical Exam  Vitals reviewed. Constitutional: She is oriented to person, place, and time. She appears well-developed and well-nourished.  HENT:  Head: Normocephalic and atraumatic.  Eyes: Pupils are equal, round, and reactive to light.  Neck: Normal range of motion.  Respiratory: Effort normal.  GI: Soft.  Genitourinary: Vagina normal.  Musculoskeletal: Normal range of motion.  Neurological: She is alert and oriented to person, place, and time.  Skin: Skin is warm.    Prenatal labs: ABO, Rh:  A+  Antibody:  negative Rubella:  Immune RPR:   NR HBsAg:   Neg HIV:   NR GBS:   POS  Assessment/Plan: 30 yo G3P1 at 38 weeks with PROM  Admit to L&D Pitocin for induction of labor Call out to Lab for GBS sensitivities as mother is PCN allergic Epidural on demand Continuous EFM / [name] 10/08/2015, 8:27 AM

## 2015-10-08 NOTE — MAU Note (Signed)
Pt presents stating her water broke at 0530. Some cramping but no real pain. Fluid was clear.

## 2015-10-08 NOTE — Anesthesia Preprocedure Evaluation (Signed)

## 2015-10-08 NOTE — Anesthesia Procedure Notes (Signed)
Epidural Patient location during procedure: OB Start time: 10/08/2015 11:14 AM End time: 10/08/2015 11:18 AM  Staffing Anesthesiologist: Leilani AbleHATCHETT, Sayyid Harewood Performed by: anesthesiologist   Preanesthetic Checklist Completed: patient identified, surgical consent, pre-op evaluation, timeout performed, IV checked, risks and benefits discussed and monitors and equipment checked  Epidural Patient position: sitting Prep: site prepped and draped and DuraPrep Patient monitoring: continuous pulse ox and blood pressure Approach: midline Location: L3-L4 Injection technique: LOR air  Needle:  Needle type: Tuohy  Needle gauge: 17 G Needle length: 9 cm and 9 Needle insertion depth: 5 cm cm Catheter type: closed end flexible Catheter size: 19 Gauge Catheter at skin depth: 10 cm Test dose: negative and Other  Assessment Sensory level: T9 Events: blood not aspirated, injection not painful, no injection resistance, negative IV test and no paresthesia  Additional Notes Reason for block:procedure for pain

## 2015-10-08 NOTE — Progress Notes (Signed)
Pt unable to void and feeling pressure. I & O cath for 400cc clear yellow urine.

## 2015-10-08 NOTE — Anesthesia Pain Management Evaluation Note (Signed)
  CRNA Pain Management Visit Note  Patient: Shawna Craig, 30 y.o., female  "Hello I am a [facility] the [facility]. We have an anesthesia team available at all times to provide care throughout the hospital, including epidural management and anesthesia for C-section. I don't know your plan for the delivery whether it a natural birth, water birth, IV sedation, nitrous supplementation, doula or epidural, but we want to meet your pain goals."   1.Was your pain managed to your expectations on prior hospitalizations?   No prior hospitalizations  2.What is your expectation for pain management during this hospitalization?     Epidural  3.How can we help you reach that goal? epidural  Record the patient's initial score and the patient's pain goal.   Pain: 0  Pain Goal: 8 [facility] wants you to be able to say your pain was always managed very well.  Cephus ShellingBURGER,Eldra Word 10/08/2015

## 2015-10-09 LAB — CBC
HCT: 31.9 % — ABNORMAL LOW (ref 36.0–46.0)
HEMOGLOBIN: 10.6 g/dL — AB (ref 12.0–15.0)
MCH: 29.2 pg (ref 26.0–34.0)
MCHC: 33.2 g/dL (ref 30.0–36.0)
MCV: 87.9 fL (ref 78.0–100.0)
Platelets: 217 10*3/uL (ref 150–400)
RBC: 3.63 MIL/uL — AB (ref 3.87–5.11)
RDW: 13.5 % (ref 11.5–15.5)
WBC: 11.8 10*3/uL — ABNORMAL HIGH (ref 4.0–10.5)

## 2015-10-09 NOTE — Anesthesia Postprocedure Evaluation (Signed)
Anesthesia Post Note  Patient: Shawna Craig  Procedure(s) Performed: * No procedures listed *  Patient location during evaluation: Mother Baby Anesthesia Type: Epidural Level of consciousness: awake and alert Pain management: pain level controlled Vital Signs Assessment: post-procedure vital signs reviewed and stable Respiratory status: spontaneous breathing, nonlabored ventilation and respiratory function stable Cardiovascular status: stable Postop Assessment: no headache, no backache and epidural receding Anesthetic complications: no     Last Vitals:  Filed Vitals:   10/09/15 0034 10/09/15 0507  BP: 97/52 102/64  Pulse: 64 64  Temp: 36.6 C 36.6 C  Resp: 17 18    Last Pain:  Filed Vitals:   10/09/15 0520  PainSc: 2    Pain Goal:                 Junious SilkGILBERT,Javaughn Opdahl

## 2015-10-09 NOTE — Lactation Note (Signed)
This note was copied from a baby's chart. Lactation Consultation Note  Patient Name: Shawna Margarita RanaMarcella Driver ZOXWR'UToday's Date: 10/09/2015 Reason for consult: Initial assessment   Initial consult with Exp BF mom of 22 hour old infant. Infant with 6+ BF, 1 void, and 2 stools since birth. Mom reports she knows how to hand express. She feels BF is going well. She denies nipple pain with feeding, enc mom to use EBM to nipples post feeding.   BF Basics reviewed with mom. LC Brochure and BF Resources Handout given, informed of IP/OP Services, BF Support Groups and LC phone #. Enc mom to call out to desk for any questions/concerns. Mom denies questions at this time. Mom has a DEBP at home for use.    Maternal Data Formula Feeding for Exclusion: No Has patient been taught Hand Expression?: Yes (Mom reports she remembers from last child and was able to show me) Does the patient have breastfeeding experience prior to this delivery?: Yes  Feeding Feeding Type: Breast Fed  LATCH Score/Interventions Latch: Grasps breast easily, tongue down, lips flanged, rhythmical sucking.  Audible Swallowing: A few with stimulation     Comfort (Breast/Nipple): Soft / non-tender     Hold (Positioning): No assistance needed to correctly position infant at breast. Intervention(s): Position options     Lactation Tools Discussed/Used WIC Program: No   Consult Status Consult Status: Follow-up Date: 10/10/15 Follow-up type: In-patient    [name] 10/09/2015, 2:09 PM

## 2015-10-09 NOTE — Progress Notes (Signed)
Patient is eating, ambulating, now voiding.  Pain control is good.  Filed Vitals:   10/08/15 1845 10/08/15 2245 10/09/15 0034 10/09/15 0507  BP: 112/70 101/55 97/52 102/64  Pulse: 68 70 64 64  Temp: 98.2 F (36.8 C) 98.6 F (37 C) 97.8 F (36.6 C) 97.8 F (36.6 C)  TempSrc: Oral Oral Oral Oral  Resp: 18 17 17 18   Height:      Weight:      SpO2:        Fundus firm Perineum without swelling.  Lab Results  Component Value Date   WBC 11.8* 10/09/2015   HGB 10.6* 10/09/2015   HCT 31.9* 10/09/2015   MCV 87.9 10/09/2015   PLT 217 10/09/2015    --/--/A POS (05/24 0755)/RI  A/P Post partum day 1.  Routine care.  Expect d/c routine.   Parents desires circumsision.  All risks, benefits and alternatives discussed with the mother.  [name] General A

## 2015-10-09 NOTE — Discharge Summary (Signed)
Obstetric Discharge Summary Reason for Admission: onset of labor Prenatal Procedures: none Intrapartum Procedures: spontaneous vaginal delivery Postpartum Procedures: none Complications-Operative and Postpartum: 2 degree perineal laceration HEMOGLOBIN  Date Value Ref Range Status  10/09/2015 10.6* 12.0 - 15.0 g/dL Final   HCT  Date Value Ref Range Status  10/09/2015 31.9* 36.0 - 46.0 % Final   HEMATOCRIT  Date Value Ref Range Status  07/15/2015 35.2 34.0 - 46.6 % Final    Discharge Diagnoses: Term Pregnancy-delivered  Discharge Information: Date: 10/09/2015 Activity: pelvic rest Diet: routine Medications: Ibuprofen Condition: stable Instructions: refer to practice specific booklet Discharge to: home Follow-up Information    Follow up with PINN, Sanjuana MaeWALDA STACIA, MD In 4 weeks.   Specialty:  Obstetrics and Gynecology   Contact information:   [address] [place] [address] [phone]       Newborn Data: Live born female  Birth Weight: 7 lb 1.8 oz (3225 g) APGAR: 8, 9  Home with mother.  Eriyonna Matsushita A 10/09/2015, 9:09 AM

## 2015-10-09 NOTE — Progress Notes (Signed)
D/C education given to mom verbalized understanding.  

## 2016-01-27 ENCOUNTER — Encounter: Payer: Self-pay | Admitting: Family Medicine

## 2016-01-27 ENCOUNTER — Ambulatory Visit (INDEPENDENT_AMBULATORY_CARE_PROVIDER_SITE_OTHER): Payer: 59 | Admitting: Family Medicine

## 2016-01-27 VITALS — BP 102/60 | HR 74 | Temp 98.0°F | Wt 169.0 lb

## 2016-01-27 DIAGNOSIS — Z6835 Body mass index (BMI) 35.0-35.9, adult: Secondary | ICD-10-CM | POA: Insufficient documentation

## 2016-01-27 DIAGNOSIS — E663 Overweight: Secondary | ICD-10-CM | POA: Diagnosis not present

## 2016-01-27 DIAGNOSIS — D509 Iron deficiency anemia, unspecified: Secondary | ICD-10-CM | POA: Diagnosis not present

## 2016-01-27 DIAGNOSIS — E669 Obesity, unspecified: Secondary | ICD-10-CM | POA: Insufficient documentation

## 2016-01-27 NOTE — Assessment & Plan Note (Signed)
Doing well so far with 11 lb wt loss since birth of baby Working with a Systems analystpersonal trainer- exercising regularly and doing weight watchers program on line  Disc goal wt os 145-155 for her frame  Filled out form for [facility] regarding this  Enc her to keep up the good work  Discussed how this problem influences overall health and the risks it imposes  Reviewed plan for weight loss with lower calorie diet (via better food choices and also portion control or program like weight watchers) and exercise building up to or more than 30 minutes 5 days per week including some aerobic activity

## 2016-01-27 NOTE — Patient Instructions (Addendum)
Form filled out  Keep up the good work  Eat a healthy diet Keep up exercise and the weight watchers program  Follow up for your annual exam with gyn -make sure they check your blood count

## 2016-01-27 NOTE — Progress Notes (Signed)
Pre visit review using our clinic review tool, if applicable. No additional management support is needed unless otherwise documented below in the visit note. 

## 2016-01-27 NOTE — Progress Notes (Signed)
Subjective:    Patient ID: Shawna Craig, female    DOB: 13-Aug-1985, 30 y.o.   MRN: [id number]  HPI Here with paperwork for insurance regarding BMI exception   Wt Readings from Last 3 Encounters:  01/27/16 169 lb (76.7 kg)  10/08/15 180 lb (81.6 kg)  10/02/15 178 lb 8 oz (81 kg)   Had a baby in May and has already lost 11 lb  BMI is 29.9  Working on diet and exercise  Has a Systems analyst and works out regularly 3-5 days per week  [employer] -it is very helpful and uses their points   Her usual weight is 150s  She would like to weigh in the 140s   She is breast feeding   Baby is doing great- had a boy and he is an easy baby and a good sleeper  Her daughter always wants to help    Annual exam is coming up with gyn   Was anemic after delivery Lab Results  Component Value Date   WBC 11.8 (H) 10/09/2015   HGB 10.6 (L) 10/09/2015   HCT 31.9 (L) 10/09/2015   MCV 87.9 10/09/2015   PLT 217 10/09/2015    Not taking any iron or pnv   Patient Active Problem List   Diagnosis Date Noted  . Overweight (BMI 25.0-29.9) 01/27/2016  . Anemia, iron deficiency 01/27/2016  . Labor and delivery indication for care or intervention 10/08/2015  . PROM (premature rupture of membranes) 10/08/2015  . Normal vaginal delivery 10/08/2015  . Fatigue 07/15/2015   No past medical history on file. Past Surgical History:  Procedure Laterality Date  . TONSILLECTOMY AND ADENOIDECTOMY     2007  . WISDOM TOOTH EXTRACTION     Social History  Substance Use Topics  . Smoking status: Never Smoker  . Smokeless tobacco: Not on file  . Alcohol use No   No family history on file. Allergies  Allergen Reactions  . Penicillins Hives    Has patient had a PCN reaction causing immediate rash, facial/tongue/throat swelling, SOB or lightheadedness with hypotension: No Has patient had a PCN reaction causing severe rash involving mucus membranes or skin necrosis: No Has patient  had a PCN reaction that required hospitalization No Has patient had a PCN reaction occurring within the last 10 years: No If all of the above answers are "NO", then may proceed with Cephalosporin use.    Current Outpatient Prescriptions on File Prior to Visit  Medication Sig Dispense Refill  . pseudoephedrine (SUDAFED) 120 MG 12 hr tablet Take 120 mg by mouth every 12 (twelve) hours as needed for congestion.     No current facility-administered medications on file prior to visit.     Review of Systems Review of Systems  Constitutional: Negative for fever, appetite change, fatigue and unexpected weight change.  Eyes: Negative for pain and visual disturbance.  Respiratory: Negative for cough and shortness of breath.   Cardiovascular: Negative for cp or palpitations    Gastrointestinal: Negative for nausea, diarrhea and constipation.  Genitourinary: Negative for urgency and frequency.  Skin: Negative for pallor or rash   Neurological: Negative for weakness, light-headedness, numbness and headaches.  Hematological: Negative for adenopathy. Does not bruise/bleed easily.  Psychiatric/Behavioral: Negative for dysphoric mood. The patient is not nervous/anxious.         Objective:   Physical Exam  Constitutional: She appears well-developed and well-nourished. No distress.  overwt and well appearing   HENT:  Head: Normocephalic  and atraumatic.  Mouth/Throat: Oropharynx is clear and moist.  Eyes: Conjunctivae and EOM are normal. Pupils are equal, round, and reactive to light.  Neck: Normal range of motion. Neck supple. No JVD present. Carotid bruit is not present. No thyromegaly present.  Cardiovascular: Normal rate, regular rhythm, normal heart sounds and intact distal pulses.  Exam reveals no gallop.   Pulmonary/Chest: Effort normal and breath sounds normal. No respiratory distress. She has no wheezes. She has no rales.  No crackles  Abdominal: Soft. Bowel sounds are normal. She exhibits  no distension, no abdominal bruit and no mass. There is no tenderness.  Musculoskeletal: She exhibits no edema.  Lymphadenopathy:    She has no cervical adenopathy.  Neurological: She is alert. She has normal reflexes.  Skin: Skin is warm and dry. No rash noted. No pallor.  Psychiatric: She has a normal mood and affect.          Assessment & Plan:   Problem List Items Addressed This Visit      Other   Overweight (BMI 25.0-29.9)    Doing well so far with 11 lb wt loss since birth of baby Working with a Systems analystpersonal trainer- exercising regularly and doing weight watchers program on line  Disc goal wt os 145-155 for her frame  Filled out form for [facility] regarding this  Enc her to keep up the good work  Discussed how this problem influences overall health and the risks it imposes  Reviewed plan for weight loss with lower calorie diet (via better food choices and also portion control or program like weight watchers) and exercise building up to or more than 30 minutes 5 days per week including some aerobic activity         Anemia, iron deficiency    Post delivery   no longer taking iron  Disc mvi with iron if tolerated  Lab Results  Component Value Date   WBC 11.8 (H) 10/09/2015   HGB 10.6 (L) 10/09/2015   HCT 31.9 (L) 10/09/2015   MCV 87.9 10/09/2015   PLT 217 10/09/2015    Offered to re check today- but pt wants to do it at gyn office         Other Visit Diagnoses   None.

## 2016-01-27 NOTE — Assessment & Plan Note (Signed)
Post delivery   no longer taking iron  Disc mvi with iron if tolerated  Lab Results  Component Value Date   WBC 11.8 (H) 10/09/2015   HGB 10.6 (L) 10/09/2015   HCT 31.9 (L) 10/09/2015   MCV 87.9 10/09/2015   PLT 217 10/09/2015    Offered to re check today- but pt wants to do it at gyn office

## 2016-02-02 ENCOUNTER — Encounter: Payer: Self-pay | Admitting: Family Medicine

## 2016-02-02 ENCOUNTER — Ambulatory Visit (INDEPENDENT_AMBULATORY_CARE_PROVIDER_SITE_OTHER): Payer: 59 | Admitting: Family Medicine

## 2016-02-02 VITALS — BP 100/70 | HR 86 | Temp 98.5°F | Ht 62.5 in | Wt 170.5 lb

## 2016-02-02 DIAGNOSIS — L02214 Cutaneous abscess of groin: Secondary | ICD-10-CM | POA: Diagnosis not present

## 2016-02-02 DIAGNOSIS — L732 Hidradenitis suppurativa: Secondary | ICD-10-CM | POA: Diagnosis not present

## 2016-02-02 MED ORDER — CEPHALEXIN 500 MG PO CAPS: 1000.0000 mg | ORAL_CAPSULE | Freq: Two times a day (BID) | ORAL | 0 refills | Status: DC

## 2016-02-02 NOTE — Progress Notes (Signed)
Dr. Karleen Hampshire T. Richele Strand, MD, CAQ [facility] [address] [place], [address] Phone: [phone] Fax: [phone]  02/02/2016  Patient: Shawna Craig, MRN: [id number], DOB: 1986/01/17, 30 y.o.  Primary Physician:  Roxy Manns, MD   Chief Complaint  Patient presents with  . Cyst    Right Groin   Subjective:   Shawna Craig is a 30 y.o. very pleasant female patient who presents with the following:  Pleasant young woman who has a history of hidradenitis who presents with a right-sided groin abscess that has been present over the last few days and worsening.  It is quite tender to palpate, and it is also reddened and has some mild temperature.  She has no systemic fever.  She is had multiple abscesses in the past.  Usually, they will drain on their own and that is curative.  She has occasionally required antibiotics.  She has never had surgical removal by general surgery.  She is eating and drinking okay without any difficulty.  She is breast-feeding currently.  Has never had surgery.  Sometimes surface and open up on her own.   Past Medical History, Surgical History, Social History, Family History, Problem List, Medications, and Allergies have been reviewed and updated if relevant.  Patient Active Problem List   Diagnosis Date Noted  . Overweight (BMI 25.0-29.9) 01/27/2016  . Anemia, iron deficiency 01/27/2016  . Labor and delivery indication for care or intervention 10/08/2015  . PROM (premature rupture of membranes) 10/08/2015  . Normal vaginal delivery 10/08/2015  . Fatigue 07/15/2015    No past medical history on file.  Past Surgical History:  Procedure Laterality Date  . TONSILLECTOMY AND ADENOIDECTOMY     2007  . WISDOM TOOTH EXTRACTION      Social History   Social History  . Marital status: Married    Spouse name: Onalee Hua  . Number of children: 1  . Years of education: N/A   Occupational History  .  exercise teacher  [employer]   Social History Main Topics  . Smoking status: Never Smoker  . Smokeless tobacco: Never Used  . Alcohol use No  . Drug use: No  . Sexual activity: Not on file   Other Topics Concern  . Not on file   Social History Narrative   Pt is married (Dr Karle Starch daughter in law) , and has a 83 mo old daughter   Stays home and also teaches exercise classes at [employer]    Plans on finishing a degree on line     No family history on file.  Allergies  Allergen Reactions  . Penicillins Hives    Has patient had a PCN reaction causing immediate rash, facial/tongue/throat swelling, SOB or lightheadedness with hypotension: No Has patient had a PCN reaction causing severe rash involving mucus membranes or skin necrosis: No Has patient had a PCN reaction that required hospitalization No Has patient had a PCN reaction occurring within the last 10 years: No If all of the above answers are "NO", then may proceed with Cephalosporin use.     Medication list reviewed and updated in full in [facility].   GEN: No acute illnesses, no fevers, chills. GI: No n/v/d, eating normally Pulm: No SOB Interactive and getting along well at home.  Otherwise, ROS is as per the HPI.  Objective:   BP 100/70   Pulse 86   Temp 98.5 F (36.9 C) (Oral)   Ht  5' 2.5" (1.588 m)   Wt 170 lb 8 oz (77.3 kg)   BMI 30.69 kg/m   GEN: WDWN, NAD, Non-toxic, A & O x 3 HEENT: Atraumatic, Normocephalic. Neck supple. No masses, No LAD. Ears and Nose: No external deformity. CV: RRR, No M/G/R. No JVD. No thrill. No extra heart sounds. PULM: CTA B, no wheezes, crackles, rhonchi. No retractions. No resp. distress. No accessory muscle use. EXTR: No c/c/e NEURO Normal gait.  PSYCH: Normally interactive. Conversant. Not depressed or anxious appearing.  Calm demeanor.   SKIN: right groin was evaluated and there is evidence of prior scarring secondary to hidradenitis, and currently there is an area of  redness approximately 4 x 4 centimeters that is notably tender to palpation, there is one area of central fluctuance, and the adjacent tissue is indurated. This portion of the physical examination was chaperoned by Terese Dooronna Loring, CMA.   Laboratory and Imaging Data:  Assessment and Plan:   Abscess of groin, right - Plan: WOUND CULTURE  Hidradenitis suppurativa - Plan: WOUND CULTURE  History of multiple prior abscesses in a patient with hidradenitis with current significant abscess.  We will attempt to I and D this area and place the patient on antibiotics.  Given its location and proximity to other anatomy, other exploration will not be done other than very superficial incision.  I&D Indication: suspect abscess Pt complaints of: erythema, pain, swelling Location: R groin Size: 4 x 4 cm Verbal informed consent obtained.  Pt aware of risks not limited to but including infection, bleeding, damage to near by organs. Prep: Chloraprep Anesthesia: 1%lidocaine without epi, good effect Incision made with #11 blade Would was not explored per above Amount expressed: 4 mL Wound was not packed Tolerated well Routine postprocedure instructions d/w patient, keep area clean and bandaged, follow up if concerns/spreading erythema/pain.   Understands to use back-up birth control.  Follow-up: please call with any concerns  New Prescriptions   CEPHALEXIN (KEFLEX) 500 MG CAPSULE    Take 2 capsules (1,000 mg total) by mouth 2 (two) times daily.   Orders Placed This Encounter  Procedures  . WOUND CULTURE    Signed,  Lattie Riege T. Noemi Bellissimo, MD   Patient's Medications  New Prescriptions   CEPHALEXIN (KEFLEX) 500 MG CAPSULE    Take 2 capsules (1,000 mg total) by mouth 2 (two) times daily.  Previous Medications   NORETHINDRONE (MICRONOR,CAMILA,ERRIN) 0.35 MG TABLET      Modified Medications   No medications on file  Discontinued Medications   PSEUDOEPHEDRINE (SUDAFED) 120 MG 12 HR TABLET    Take  120 mg by mouth every 12 (twelve) hours as needed for congestion.

## 2016-02-02 NOTE — Patient Instructions (Signed)
Incision and Drainage, Care After Refer to this sheet in the next few weeks. These instructions provide you with information on caring for yourself after your procedure. Your caregiver may also give you more specific instructions. Your treatment has been planned according to current medical practices, but problems sometimes occur. Call your caregiver if you have any problems or questions after your procedure. HOME CARE INSTRUCTIONS   If antibiotic medicine is given, take it as directed. Finish it even if you start to feel better.  Only take over-the-counter or prescription medicines for pain, discomfort, or fever as directed by your caregiver.  Keep all follow-up appointments as directed by your caregiver.  Change any bandages (dressings) as directed by your caregiver. Replace old dressings with clean dressings.  Wash your hands before and after caring for your wound. You will receive specific instructions for cleansing and caring for your wound.  SEEK MEDICAL CARE IF:   You have increased pain, swelling, or redness around the wound.  You have increased drainage, smell, or bleeding from the wound.  You have muscle aches, chills, or you feel generally sick.  You have a fever. MAKE SURE YOU:   Understand these instructions.  Will watch your condition.  Will get help right away if you are not doing well or get worse.   This information is not intended to replace advice given to you by your health care provider. Make sure you discuss any questions you have with your health care provider.   Document Released: 07/26/2011 Document Revised: 05/24/2014 Document Reviewed: 07/26/2011 Elsevier Interactive Patient Education 2016 [employer]  

## 2016-02-02 NOTE — Progress Notes (Signed)
Pre visit review using our clinic review tool, if applicable. No additional management support is needed unless otherwise documented below in the visit note. 

## 2016-02-03 DIAGNOSIS — L732 Hidradenitis suppurativa: Secondary | ICD-10-CM | POA: Insufficient documentation

## 2016-02-06 LAB — WOUND CULTURE
GRAM STAIN: NONE SEEN
ORGANISM ID, BACTERIA: NORMAL

## 2016-03-11 ENCOUNTER — Telehealth: Payer: Self-pay | Admitting: Family Medicine

## 2016-04-01 NOTE — Telephone Encounter (Signed)
error 

## 2016-04-12 ENCOUNTER — Encounter: Payer: Self-pay | Admitting: Family Medicine

## 2016-04-12 ENCOUNTER — Ambulatory Visit (INDEPENDENT_AMBULATORY_CARE_PROVIDER_SITE_OTHER): Payer: 59 | Admitting: Family Medicine

## 2016-04-12 VITALS — BP 124/68 | HR 86 | Temp 98.3°F | Ht 62.5 in | Wt 169.0 lb

## 2016-04-12 DIAGNOSIS — F419 Anxiety disorder, unspecified: Secondary | ICD-10-CM

## 2016-04-12 DIAGNOSIS — J069 Acute upper respiratory infection, unspecified: Secondary | ICD-10-CM | POA: Insufficient documentation

## 2016-04-12 DIAGNOSIS — B9789 Other viral agents as the cause of diseases classified elsewhere: Secondary | ICD-10-CM | POA: Diagnosis not present

## 2016-04-12 DIAGNOSIS — F32A Depression, unspecified: Secondary | ICD-10-CM | POA: Insufficient documentation

## 2016-04-12 DIAGNOSIS — F4321 Adjustment disorder with depressed mood: Secondary | ICD-10-CM

## 2016-04-12 DIAGNOSIS — F4320 Adjustment disorder, unspecified: Secondary | ICD-10-CM

## 2016-04-12 DIAGNOSIS — F329 Major depressive disorder, single episode, unspecified: Secondary | ICD-10-CM | POA: Insufficient documentation

## 2016-04-12 MED ORDER — SERTRALINE HCL 50 MG PO TABS: 50.0000 mg | ORAL_TABLET | Freq: Every day | ORAL | 11 refills | Status: DC

## 2016-04-12 NOTE — Progress Notes (Signed)
Pre visit review using our clinic review tool, if applicable. No additional management support is needed unless otherwise documented below in the visit note. 

## 2016-04-12 NOTE — Patient Instructions (Addendum)
Start zoloft at 1/2 pill each evening for 2 weeks Then -if ok - increase to 1 pill each evening  If you develop any intolerable side effects  - alert us  If you get worse or feel suicidal also alert us  Continue counseling  Get support and help where ever you can and try to not  isolate yourself  Get outdoors in the light when you can  Take care of yourself  Xanax is ok at 0.25-0.5 mg up to twice daily if needed   For your cold -try the otc sudafed pe and drink fluids/ vaporizer/ nasal saline spray  mucinex for congestion if needed  Update if not starting to improve in a week or if worsening

## 2016-04-12 NOTE — Assessment & Plan Note (Signed)
Re assuring exam  Early in course Disc symptomatic care - see instructions on AVS  Update if not starting to improve in a week or if worsening  (esp if sinus pain or fever)

## 2016-04-12 NOTE — Assessment & Plan Note (Signed)
For sudden loss of husband to a heart arrhythmia  Overall able to function but now that adrenaline is going down she struggles more with depression/ anxiety/sleep problems and hopelessness (anhedonia at times)  She is seeing a counselor Reviewed stressors/ coping techniques/symptoms/ support sources/ tx options and side effects in detail today  Will begin zoloft 25 mg daily for 2 wk and then inc to 50 mg  Discussed expectations of SSRI medication including time to effectiveness and mechanism of action, also poss of side effects (early and late)- including mental fuzziness, weight or appetite change, nausea and poss of worse dep or anxiety (even suicidal thoughts)  Pt voiced understanding and will stop med and update if this occurs   She plans to continue therapy  Enc outdoor time and also self care when able  F/u planned 1 mo or earlier if needed >25 minutes spent in face to face time with patient, >50% spent in counselling or coordination of care

## 2016-04-12 NOTE — Progress Notes (Signed)
Subjective:    Patient ID: Shawna Craig, female    DOB: 08-Mar-1986, 30 y.o.   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" Cannot sleep  - both kids are in bed with her  She is seeing a Veterinary surgeoncounselor at hospice- Lyndel Safeatti Gaspirello  That is going well   Also talks to her MIL a lot -helpful  [facility]  Has one single friend -rest are married and have kids    Cries a lot   Is getting outside a bit   Has a bad cold now - 2 days  Congestion and runny nose with mild cough No fever  Got it from the baby  Yesterday and today-worst  Took some sudafed pe - am and pm - has not started it yet   Has never been on anx/dep med in the past    Older child seems ok - she talks about daddy being in [place] 42(3 y old)  Pecola LeisureBaby is too young to realize   Getting ready to move  Closer to David's parents  Wants to get settled before she gets a job   Has some xanax from her obgyn - for short term /emergencies  Was afraid to take it   Patient Active Problem List   Diagnosis Date Noted  . Grief reaction 04/12/2016  . Hidradenitis suppurativa 02/03/2016  . Overweight (BMI 25.0-29.9) 01/27/2016  . Anemia, iron deficiency 01/27/2016  . Labor and delivery indication for care or intervention 10/08/2015  . PROM (premature rupture of membranes) 10/08/2015  . Normal vaginal delivery 10/08/2015  . Fatigue 07/15/2015   No past medical history on file. Past Surgical History:  Procedure Laterality Date  . TONSILLECTOMY AND ADENOIDECTOMY     2007  . WISDOM TOOTH EXTRACTION     Social History  Substance Use Topics  . Smoking status: Never Smoker  . Smokeless tobacco: Never Used  . Alcohol use No   No family history on file. Allergies  Allergen Reactions  . Penicillins Hives    Has patient had a PCN reaction causing immediate rash,  facial/tongue/throat swelling, SOB or lightheadedness with hypotension: No Has patient had a PCN reaction causing severe rash involving mucus membranes or skin necrosis: No Has patient had a PCN reaction that required hospitalization No Has patient had a PCN reaction occurring within the last 10 years: No If all of the above answers are "NO", then may proceed with Cephalosporin use.    Current Outpatient Prescriptions on File Prior to Visit  Medication Sig Dispense Refill  . norethindrone (MICRONOR,CAMILA,ERRIN) 0.35 MG tablet      No current facility-administered medications on file prior to visit.     Review of Systems  Constitutional: Positive for appetite change and fatigue. Negative for fever.  HENT: Positive for congestion, postnasal drip, rhinorrhea, sinus pressure, sneezing and sore throat. Negative for ear pain.   Eyes: Negative for pain and discharge.  Respiratory: Positive for cough. Negative for shortness of breath, wheezing and stridor.   Cardiovascular: Negative for chest pain.  Gastrointestinal: Negative for diarrhea, nausea and vomiting.  Genitourinary: Negative for frequency, hematuria and urgency.  Musculoskeletal: Negative for arthralgias and myalgias.  Skin: Negative for rash.  Neurological: Positive for headaches. Negative for dizziness, weakness and light-headedness.  Psychiatric/Behavioral: Positive for decreased concentration,  dysphoric mood and sleep disturbance. Negative for confusion. The patient is nervous/anxious.        Objective:   Physical Exam  Constitutional: She appears well-developed and well-nourished. No distress.  Well appearing/ tearful at times   HENT:  Head: Normocephalic and atraumatic.  Right Ear: External ear normal.  Left Ear: External ear normal.  Mouth/Throat: Oropharynx is clear and moist.  Nares are injected and congested  No sinus tenderness Clear rhinorrhea and post nasal drip   Eyes: Conjunctivae and EOM are normal. Pupils  are equal, round, and reactive to light. Right eye exhibits no discharge. Left eye exhibits no discharge.  Neck: Normal range of motion. Neck supple.  Cardiovascular: Normal rate, regular rhythm and normal heart sounds.   Pulmonary/Chest: Effort normal and breath sounds normal. No respiratory distress. She has no wheezes. She has no rales. She exhibits no tenderness.  Lymphadenopathy:    She has no cervical adenopathy.  Neurological: She is alert. She has normal reflexes. She displays no tremor. No cranial nerve deficit. She exhibits normal muscle tone. Coordination normal.  Skin: Skin is warm and dry. No rash noted. No pallor.  Psychiatric: Her speech is normal and behavior is normal. Thought content normal. Her mood appears anxious. Her affect is not blunt, not labile and not inappropriate. Thought content is not paranoid. Cognition and memory are normal. She exhibits a depressed mood. She expresses no homicidal and no suicidal ideation.  Attentive and well appearing  Tearful when discussing her husband and her grief /family situation  Voices good support Denies SI          Assessment & Plan:   Problem List Items Addressed This Visit      Respiratory   Viral URI    Re assuring exam  Early in course Disc symptomatic care - see instructions on AVS  Update if not starting to improve in a week or if worsening  (esp if sinus pain or fever)        Other   Grief reaction    For sudden loss of husband to a heart arrhythmia  Overall able to function but now that adrenaline is going down she struggles more with depression/ anxiety/sleep problems and hopelessness (anhedonia at times)  She is seeing a counselor Reviewed stressors/ coping techniques/symptoms/ support sources/ tx options and side effects in detail today  Will begin zoloft 25 mg daily for 2 wk and then inc to 50 mg  Discussed expectations of SSRI medication including time to effectiveness and mechanism of action, also poss  of side effects (early and late)- including mental fuzziness, weight or appetite change, nausea and poss of worse dep or anxiety (even suicidal thoughts)  Pt voiced understanding and will stop med and update if this occurs   She plans to continue therapy  Enc outdoor time and also self care when able  F/u planned 1 mo or earlier if needed >25 minutes spent in face to face time with patient, >50% spent in counselling or coordination of care

## 2016-04-14 ENCOUNTER — Ambulatory Visit: Payer: 59 | Admitting: Family Medicine

## 2016-05-12 ENCOUNTER — Ambulatory Visit (INDEPENDENT_AMBULATORY_CARE_PROVIDER_SITE_OTHER): Payer: 59 | Admitting: Family Medicine

## 2016-05-12 ENCOUNTER — Encounter: Payer: Self-pay | Admitting: Family Medicine

## 2016-05-12 VITALS — BP 106/64 | HR 60 | Temp 97.5°F | Ht 62.5 in | Wt 172.2 lb

## 2016-05-12 DIAGNOSIS — F4321 Adjustment disorder with depressed mood: Secondary | ICD-10-CM

## 2016-05-12 DIAGNOSIS — F4320 Adjustment disorder, unspecified: Secondary | ICD-10-CM | POA: Diagnosis not present

## 2016-05-12 NOTE — Progress Notes (Signed)
Pre visit review using our clinic review tool, if applicable. No additional management support is needed unless otherwise documented below in the visit note. 

## 2016-05-12 NOTE — Progress Notes (Signed)
Subjective:    Patient ID: Shawna Craig, female    DOB: [birth year], 30 y.o.   MRN: [id number]  HPI Here for f/u of mood and grief reaction    Started zoloft last visit  Overall doing some better overall (but holidays were not as good)   Improved symptoms - less tearful overall / and does not cry as long  Compulsive thoughts (ie re running the death day in her head) is more controllable   Sleep- she can fall asleep and then wakes up a few hours later  Appetite - has been fine (eating more junk lately)  Certain things set off anxiety still /tries to avoid triggers overall   Still going to counseling - it is helpful   Kids are doing very well overall  Will have her older child see the counselor also - asking a lot about her dad lately   Wt Readings from Last 3 Encounters:  05/12/16 172 lb 4 oz (78.1 kg)  04/12/16 169 lb (76.7 kg)  02/02/16 170 lb 8 oz (77.3 kg)   In laws and parents are very helpful  A little time for self care  Not a lot of outdoor time - due to the weather   Patient Active Problem List   Diagnosis Date Noted  . Grief reaction 04/12/2016  . Hidradenitis suppurativa 02/03/2016  . Overweight (BMI 25.0-29.9) 01/27/2016  . Anemia, iron deficiency 01/27/2016  . Labor and delivery indication for care or intervention 10/08/2015  . PROM (premature rupture of membranes) 10/08/2015  . Normal vaginal delivery 10/08/2015  . Fatigue 07/15/2015   No past medical history on file. Past Surgical History:  Procedure Laterality Date  . TONSILLECTOMY AND ADENOIDECTOMY     2007  . WISDOM TOOTH EXTRACTION     Social History  Substance Use Topics  . Smoking status: Never Smoker  . Smokeless tobacco: Never Used  . Alcohol use No   No family history on file. Allergies  Allergen Reactions  . Penicillins Hives    Has patient had a PCN reaction causing immediate rash, facial/tongue/throat swelling, SOB or lightheadedness with hypotension: No Has patient had a  PCN reaction causing severe rash involving mucus membranes or skin necrosis: No Has patient had a PCN reaction that required hospitalization No Has patient had a PCN reaction occurring within the last 10 years: No If all of the above answers are "NO", then may proceed with Cephalosporin use.    Current Outpatient Prescriptions on File Prior to Visit  Medication Sig Dispense Refill  . ALPRAZolam (XANAX) 0.25 MG tablet Take 0.25-0.5 mg by mouth 2 (two) times daily as needed for anxiety.    . norethindrone (MICRONOR,CAMILA,ERRIN) 0.35 MG tablet     . sertraline (ZOLOFT) 50 MG tablet Take 1 tablet (50 mg total) by mouth daily. 30 tablet 11   No current facility-administered medications on file prior to visit.      Review of Systems    Review of Systems  Constitutional: Negative for fever, appetite change, fatigue and unexpected weight change.  Eyes: Negative for pain and visual disturbance.  Respiratory: Negative for cough and shortness of breath.   Cardiovascular: Negative for cp or palpitations    Gastrointestinal: Negative for nausea, diarrhea and constipation.  Genitourinary: Negative for urgency and frequency.  Skin: Negative for pallor or rash   Neurological: Negative for weakness, light-headedness, numbness and headaches.  Hematological: Negative for adenopathy. Does not bruise/bleed easily.  Psychiatric/Behavioral: pos for symptoms of grief/dep/anx that  are improved/ neg for SI     Objective:   Physical Exam  Constitutional: She appears well-developed and well-nourished. No distress.  Well appearing   HENT:  Head: Normocephalic and atraumatic.  Eyes: Conjunctivae and EOM are normal. Pupils are equal, round, and reactive to light.  Neck: Normal range of motion. Neck supple. No thyromegaly present.  Cardiovascular: Normal rate, regular rhythm and normal heart sounds.   Pulmonary/Chest: Effort normal and breath sounds normal. No respiratory distress. She has no wheezes. She  has no rales.  Lymphadenopathy:    She has no cervical adenopathy.  Neurological: She is alert. She has normal reflexes. She displays no tremor. No cranial nerve deficit. She exhibits normal muscle tone. Coordination normal.  Skin: No pallor.  Psychiatric: Her speech is normal and behavior is normal. Thought content normal. Her mood appears not anxious. Her affect is not blunt and not labile. Thought content is not paranoid. Cognition and memory are normal. She does not exhibit a depressed mood. She expresses no homicidal and no suicidal ideation.  Not tearful today  Candidly talks about stressors and situation  Overall improved affect          Assessment & Plan:   Problem List Items Addressed This Visit      Other   Grief reaction - Primary    Overall mood is improving and able to do more  Continues counseling  Tolerating zoloft-will stay at this dose Reviewed stressors/ coping techniques/symptoms/ support sources/ tx options and side effects in detail today  Disc expectations for grief and triggers Enc to minimize caffeine for anxiety and sleep  Stay active / take time for self care when needed  Update if she feels like she needs to change dose

## 2016-05-12 NOTE — Assessment & Plan Note (Signed)
Overall mood is improving and able to do more  Continues counseling  Tolerating zoloft-will stay at this dose Reviewed stressors/ coping techniques/symptoms/ support sources/ tx options and side effects in detail today  Disc expectations for grief and triggers Enc to minimize caffeine for anxiety and sleep  Stay active / take time for self care when needed  Update if she feels like she needs to change dose

## 2016-05-12 NOTE — Patient Instructions (Signed)
I'm glad you are doing a little better  Continue the zoloft at 50 mg  Continue counseling  Take care of yourself / get outdoors when you can and stay physically active for anxiety and sleep   Update me if you want to change the dose

## 2016-07-27 ENCOUNTER — Encounter: Payer: Self-pay | Admitting: Family Medicine

## 2016-07-28 MED ORDER — SERTRALINE HCL 50 MG PO TABS: 75.0000 mg | ORAL_TABLET | Freq: Every day | ORAL | 11 refills | Status: DC

## 2016-07-28 NOTE — Telephone Encounter (Signed)
zoloft inc to 75

## 2017-06-07 ENCOUNTER — Encounter: Payer: Self-pay | Admitting: Family Medicine

## 2017-06-07 ENCOUNTER — Ambulatory Visit (INDEPENDENT_AMBULATORY_CARE_PROVIDER_SITE_OTHER): Payer: 59 | Admitting: Family Medicine

## 2017-06-07 DIAGNOSIS — J069 Acute upper respiratory infection, unspecified: Secondary | ICD-10-CM | POA: Diagnosis not present

## 2017-06-07 MED ORDER — GUAIFENESIN-CODEINE 100-10 MG/5ML PO SYRP: 5.0000 mL | ORAL_SOLUTION | Freq: Three times a day (TID) | ORAL | 0 refills | Status: DC | PRN

## 2017-06-07 MED ORDER — POLYMYXIN B-TRIMETHOPRIM 10000-0.1 UNIT/ML-% OP SOLN: 1.0000 [drp] | Freq: Four times a day (QID) | OPHTHALMIC | 0 refills | Status: DC

## 2017-06-07 NOTE — Patient Instructions (Signed)
Use the eye drops in the meantime.  Sedation caution on the cough syrup.  Update us as needed.  Rest and fluids.  Take care.  Glad to see you.

## 2017-06-07 NOTE — Progress Notes (Signed)
Son was sick first, started about 5 days ago with a cold.  Mult family members sick.    Patient started with sx about 4 days ago.  Eye discharge and burning and itching.  B eyes with discharged caked on this AM.  ST.  Congestion.  No fevers.  No vomiting, no diarrhea.  Some cough.  No sputum.    Meds, vitals, and allergies reviewed.   ROS: Per HPI unless specifically indicated in ROS section   GEN: nad, alert and oriented HEENT: mucous membranes moist, tm w/o erythema, nasal exam w/o erythema, clear discharge noted,  OP with cobblestoning, mild frontal sinus tender B B conjunctival injection.  NECK: supple w/o LA CV: rrr.   PULM: ctab, no inc wob EXT: no edema

## 2017-06-08 ENCOUNTER — Telehealth: Payer: Self-pay | Admitting: Primary Care

## 2017-06-08 ENCOUNTER — Other Ambulatory Visit: Payer: Self-pay | Admitting: Primary Care

## 2017-06-08 DIAGNOSIS — J019 Acute sinusitis, unspecified: Secondary | ICD-10-CM

## 2017-06-08 MED ORDER — AMOXICILLIN-POT CLAVULANATE 875-125 MG PO TABS: 1.0000 | ORAL_TABLET | Freq: Two times a day (BID) | ORAL | 0 refills | Status: DC

## 2017-06-08 NOTE — Telephone Encounter (Signed)
Notified that patient's symptoms were not completely disclosed during her visit yesterday. Most of her family are on antibiotics for a sinusitis. She's had progressive sinus pressure with expulsion of thick green mucous from her nasal cavity. Given that she was last evaluated yesterday, will send in treatment. Advised that she has NO allergy to PCN and has taken Augmentin numerous times.  Rx for Augmentin course sent to pharmacy. Patient aware.

## 2017-06-08 NOTE — Assessment & Plan Note (Signed)
This could be viral but given the conjunctival injection to provide his symptoms would start Polytrim.  Can use Cheratussin as needed for cough. Sedation caution on the cough syrup.  Update us as needed.  Rest and fluids.  Low threshold to start oral antibiotics if her sinus pain is worse.

## 2017-06-09 ENCOUNTER — Ambulatory Visit: Payer: Self-pay | Admitting: Family Medicine

## 2017-06-21 ENCOUNTER — Telehealth: Payer: Self-pay | Admitting: Family Medicine

## 2017-06-21 MED ORDER — PREDNISONE 10 MG PO TABS: ORAL_TABLET | ORAL | 0 refills | Status: DC

## 2017-06-21 MED ORDER — DOXYCYCLINE HYCLATE 100 MG PO TABS: 100.0000 mg | ORAL_TABLET | Freq: Two times a day (BID) | ORAL | 0 refills | Status: DC

## 2017-06-21 NOTE — Telephone Encounter (Signed)
Pt states cough worse. Still with sinus "pressure and pain, extreme congestion." Pt agreeable to try Doxycycline.Also states flonase ineffective.

## 2017-06-21 NOTE — Telephone Encounter (Signed)
I sent it in 

## 2017-06-21 NOTE — Telephone Encounter (Signed)
Please ask her if she wants to try a low dose taper of prednisone to help the congestion- let me know and I will send it in   Thanks

## 2017-06-21 NOTE — Telephone Encounter (Signed)
Patient says that would be great.  Please send the [facility] to [facility], [address], [place].  The [facility], [place].

## 2017-06-21 NOTE — Telephone Encounter (Signed)
Left VM requesting pt to call the office back CRM created 

## 2017-06-21 NOTE — Telephone Encounter (Signed)
I sent that in  

## 2017-06-21 NOTE — Telephone Encounter (Signed)
Updated by Dr Milas KocherLetvak-Marci is still suffering from uri symptoms  Took augmentin for sinusitis  Based on what the family had - wondering about something atypical  Would like to try something else   I will send doxycycline to her pharmacy ([facility] [place]) Please call and check in on her when able and ask how her symptoms are and if she needs anything else  Please f/u if no improvement

## 2017-08-11 ENCOUNTER — Encounter: Payer: Self-pay | Admitting: Family Medicine

## 2017-09-22 ENCOUNTER — Other Ambulatory Visit: Payer: Self-pay | Admitting: Internal Medicine

## 2017-09-22 MED ORDER — PREDNISONE 10 MG PO TABS: ORAL_TABLET | ORAL | 0 refills | Status: DC

## 2018-02-14 ENCOUNTER — Telehealth: Payer: Self-pay | Admitting: Family Medicine

## 2018-02-14 MED ORDER — POLYMYXIN B-TRIMETHOPRIM 10000-0.1 UNIT/ML-% OP SOLN: 1.0000 [drp] | Freq: Four times a day (QID) | OPHTHALMIC | 0 refills | Status: DC

## 2018-02-14 NOTE — Telephone Encounter (Signed)
Seen at daughter's appt Mom with pink eye exposure at school, 2 day h/o R eye redness, swelling, matting in the morning EOMI intact without pain.  Anticipate viral conjunctivitis - rec lubricating eye drops and if no improvement to fill polytrim eye drops sent to pharmacy. Pt agrees.

## 2018-02-21 ENCOUNTER — Encounter: Payer: Self-pay | Admitting: Family Medicine

## 2018-02-23 MED ORDER — ESCITALOPRAM OXALATE 20 MG PO TABS: 20.0000 mg | ORAL_TABLET | Freq: Every day | ORAL | 11 refills | Status: DC

## 2018-03-07 ENCOUNTER — Encounter: Payer: Self-pay | Admitting: Family Medicine

## 2018-03-08 MED ORDER — ALPRAZOLAM 0.5 MG PO TABS: 0.5000 mg | ORAL_TABLET | Freq: Every evening | ORAL | 1 refills | Status: DC | PRN

## 2018-03-08 NOTE — Telephone Encounter (Signed)
Xanax sent

## 2018-03-20 ENCOUNTER — Encounter: Payer: Self-pay | Admitting: Family Medicine

## 2018-03-21 NOTE — Telephone Encounter (Signed)
I left a message on patient's voice mail asking her to call me back to schedule a 30 minute appointment.

## 2018-03-29 ENCOUNTER — Encounter: Payer: Self-pay | Admitting: Family Medicine

## 2018-03-29 ENCOUNTER — Ambulatory Visit (INDEPENDENT_AMBULATORY_CARE_PROVIDER_SITE_OTHER): Payer: 59 | Admitting: Family Medicine

## 2018-03-29 VITALS — BP 106/68 | HR 68 | Temp 98.2°F | Ht 62.5 in | Wt 188.0 lb

## 2018-03-29 DIAGNOSIS — F329 Major depressive disorder, single episode, unspecified: Secondary | ICD-10-CM | POA: Diagnosis not present

## 2018-03-29 DIAGNOSIS — F419 Anxiety disorder, unspecified: Secondary | ICD-10-CM | POA: Diagnosis not present

## 2018-03-29 MED ORDER — ALPRAZOLAM 0.5 MG PO TABS: 0.5000 mg | ORAL_TABLET | Freq: Every evening | ORAL | 1 refills | Status: DC | PRN

## 2018-03-29 MED ORDER — BUSPIRONE HCL 15 MG PO TABS: 7.5000 mg | ORAL_TABLET | Freq: Two times a day (BID) | ORAL | 11 refills | Status: DC

## 2018-03-29 NOTE — Assessment & Plan Note (Signed)
Worsened anxiety component - in pt s/p 2 y after husband's death  lexapro helps depression somewhat  Reviewed stressors/ coping techniques/symptoms/ support sources/ tx options and side effects in detail today Will add buspar 7.5 mg bid and titrate up if effective (pt will update)  Discussed expectations of this medication including time to effectiveness and mechanism of action, also poss of side effects (early and late)- including mental fuzziness, weight or appetite change, nausea and poss of worse dep or anxiety (even suicidal thoughts)  Pt voiced understanding and will stop med and update if this occurs   SNRI is a consideration if not improved  Also ref to psychiatry if needed Pt plans to return to counselor soon  >25 minutes spent in face to face time with patient, >50% spent in counselling or coordination of care - incl plan for better self care and pos coping tech like exercise/meditation and socialization  Plan f/u after pt updates with progress Aware to call/seek care at ED if severe depression or SI

## 2018-03-29 NOTE — Progress Notes (Signed)
Subjective:    Patient ID: Shawna Craig, female    DOB: 29-Oct-1985, 32 y.o.   MRN: [id number]  HPI Here for mood symptoms/anxiety   Has been on zoloft in the past for grief/depression/anxiety  Was doing better this spring and tried to wean it   2 y anniversary of husband's death   Later struggled more-went back to prev dose  Then we tried a switch to lexapro  It does help feelings of hopelessness  Found herself still very tearful and frustrated and irritable  Realized that she feels anxiety all the time  Like she is carrying a weight  Has adapted to it   We added short term prn xanax - took it to sleep (0.75mg )  Now not helping a lot  Kids are also still in the bed  Not sleeping well  Mind will not turn off   Uses food for comfort  Just now getting diet under control  She is less social- since coming home alone is too hard   Trying to get into exercise /gym   (more anxious if she cannot exercise)   Biggest worry (a worrier) is "going off the deep end" or being unable to care for her kids Also worries about loosing temper at work  No thoughts of self harm   Kids are good overall   Mother is supportive  Does a group chat with co workers also  Does not like to talk in general   Last time she had counseling- a while Going back next week - sees [employer] Readings from Last 3 Encounters:  03/29/18 188 lb (85.3 kg)  06/07/17 187 lb 8 oz (85 kg)  05/12/16 172 lb 4 oz (78.1 kg)   33.84 kg/m   PHQ score 11 GAD 20   Patient Active Problem List   Diagnosis Date Noted  . Anxiety and depression 04/12/2016  . Hidradenitis suppurativa 02/03/2016  . Overweight (BMI 25.0-29.9) 01/27/2016  . Anemia, iron deficiency 01/27/2016  . Labor and delivery indication for care or intervention 10/08/2015  . PROM (premature rupture of membranes) 10/08/2015  . Normal vaginal delivery 10/08/2015  . Fatigue 07/15/2015   History reviewed. No pertinent past  medical history. Past Surgical History:  Procedure Laterality Date  . TONSILLECTOMY AND ADENOIDECTOMY     2007  . WISDOM TOOTH EXTRACTION     Social History   Tobacco Use  . Smoking status: Never Smoker  . Smokeless tobacco: Never Used  Substance Use Topics  . Alcohol use: No    Alcohol/week: 0.0 standard drinks  . Drug use: No   History reviewed. No pertinent family history. No Active Allergies Current Outpatient Medications on File Prior to Visit  Medication Sig Dispense Refill  . escitalopram (LEXAPRO) 20 MG tablet Take 1 tablet (20 mg total) by mouth daily. 30 tablet 11  . norethindrone-ethinyl estradiol-iron (MICROGESTIN FE,GILDESS FE,LOESTRIN FE) 1.5-30 MG-MCG tablet Take 1 tablet by mouth daily.     No current facility-administered medications on file prior to visit.     Review of Systems  Constitutional: Positive for fatigue. Negative for activity change, appetite change, fever and unexpected weight change.  HENT: Negative for congestion, ear pain, rhinorrhea, sinus pressure and sore throat.   Eyes: Negative for pain, redness and visual disturbance.  Respiratory: Negative for cough, shortness of breath and wheezing.   Cardiovascular: Negative for chest pain and palpitations.  Gastrointestinal: Negative for abdominal pain, blood in stool, constipation and  diarrhea.  Endocrine: Negative for polydipsia and polyuria.  Genitourinary: Negative for dysuria, frequency and urgency.  Musculoskeletal: Negative for arthralgias, back pain and myalgias.  Skin: Negative for pallor and rash.  Allergic/Immunologic: Negative for environmental allergies.  Neurological: Negative for dizziness, syncope and headaches.  Hematological: Negative for adenopathy. Does not bruise/bleed easily.  Psychiatric/Behavioral: Positive for decreased concentration, dysphoric mood and sleep disturbance. Negative for self-injury and suicidal ideas. The patient is nervous/anxious.        Objective:    Physical Exam  Constitutional: She appears well-developed and well-nourished. No distress.  obese and well appearing   HENT:  Head: Normocephalic and atraumatic.  Mouth/Throat: Oropharynx is clear and moist.  Eyes: Pupils are equal, round, and reactive to light. Conjunctivae and EOM are normal. No scleral icterus.  Neck: Normal range of motion. Neck supple. No thyromegaly present.  Cardiovascular: Normal rate, regular rhythm and normal heart sounds.  Pulmonary/Chest: Effort normal and breath sounds normal. No respiratory distress. She has no wheezes.  Musculoskeletal: She exhibits no edema.  Lymphadenopathy:    She has no cervical adenopathy.  Neurological: She is alert. She displays no tremor and normal reflexes. No cranial nerve deficit. Coordination normal.  Skin: Skin is warm and dry.  Psychiatric: Her speech is normal and behavior is normal. Thought content normal. Her mood appears anxious. Her affect is not blunt, not labile and not inappropriate. She is not actively hallucinating. Thought content is not paranoid. Cognition and memory are impaired. She exhibits a depressed mood. She expresses no homicidal and no suicidal ideation.  Pleasant  Discusses stressors and symptoms candidly She is attentive.          Assessment & Plan:   Problem List Items Addressed This Visit      Other   Anxiety and depression    Worsened anxiety component - in pt s/p 2 y after husband's death  lexapro helps depression somewhat  Reviewed stressors/ coping techniques/symptoms/ support sources/ tx options and side effects in detail today Will add buspar 7.5 mg bid and titrate up if effective (pt will update)  Discussed expectations of this medication including time to effectiveness and mechanism of action, also poss of side effects (early and late)- including mental fuzziness, weight or appetite change, nausea and poss of worse dep or anxiety (even suicidal thoughts)  Pt voiced understanding and will  stop med and update if this occurs   SNRI is a consideration if not improved  Also ref to psychiatry if needed Pt plans to return to counselor soon  >25 minutes spent in face to face time with patient, >50% spent in counselling or coordination of care - incl plan for better self care and pos coping tech like exercise/meditation and socialization  Plan f/u after pt updates with progress Aware to call/seek care at ED if severe depression or SI      Relevant Medications   ALPRAZolam (XANAX) 0.5 MG tablet   busPIRone (BUSPAR) 15 MG tablet

## 2018-03-29 NOTE — Patient Instructions (Addendum)
Let's try adding buspar - 7.5 mg twice daily - if side effects please let me know or if you feel worse  Continue the lexapro  Xanax only if needed   Get back to your counselor  Keep working out !  Talk to people - push yourself to socialize   Calm and Headspace are the most popular apps  Re consider getting back to that  Yoga is great also

## 2018-05-19 LAB — HM PAP SMEAR: HM Pap smear: NORMAL

## 2018-07-03 ENCOUNTER — Ambulatory Visit (INDEPENDENT_AMBULATORY_CARE_PROVIDER_SITE_OTHER): Payer: 59 | Admitting: Internal Medicine

## 2018-07-03 ENCOUNTER — Encounter: Payer: Self-pay | Admitting: Internal Medicine

## 2018-07-03 VITALS — BP 122/78 | HR 76 | Temp 97.9°F | Wt 191.0 lb

## 2018-07-03 DIAGNOSIS — N898 Other specified noninflammatory disorders of vagina: Secondary | ICD-10-CM

## 2018-07-04 ENCOUNTER — Other Ambulatory Visit: Payer: Self-pay | Admitting: Internal Medicine

## 2018-07-04 LAB — WET PREP BY MOLECULAR PROBE
Candida species: DETECTED — AB
Gardnerella vaginalis: NOT DETECTED
MICRO NUMBER: 203915
SPECIMEN QUALITY: ADEQUATE
Trichomonas vaginosis: NOT DETECTED

## 2018-07-04 LAB — C. TRACHOMATIS/N. GONORRHOEAE RNA
C. trachomatis RNA, TMA: NOT DETECTED
N. GONORRHOEAE RNA, TMA: NOT DETECTED

## 2018-07-04 MED ORDER — FLUCONAZOLE 150 MG PO TABS: ORAL_TABLET | ORAL | 0 refills | Status: DC

## 2018-07-05 ENCOUNTER — Encounter: Payer: Self-pay | Admitting: Internal Medicine

## 2018-07-06 ENCOUNTER — Encounter: Payer: Self-pay | Admitting: Internal Medicine

## 2018-07-06 NOTE — Progress Notes (Signed)
Subjective:    Patient ID: Shawna Craig, female    DOB: 1986-04-13, 33 y.o.   MRN: [id number]  HPI  Pt presents to the clinic today with c/o vaginal itching. She reports this has been intermittent x 1 month. The itching is external, not internal. She denies pelvic pain, vaginal discharge, odor or abnormal bleeding. She has some burning at the end of urination but denies urgency, frequency, dysuria or blood in her urine. She had her pap 3 weeks ago, which was normal. She has tried OTC Monistat with minimal relief.  Review of Systems  No past medical history on file.  Current Outpatient Medications  Medication Sig Dispense Refill  . ALPRAZolam (XANAX) 0.5 MG tablet Take 1-2 tablets (0.5-1 mg total) by mouth at bedtime as needed for anxiety. With caution of sedation 60 tablet 1  . busPIRone (BUSPAR) 15 MG tablet Take 0.5 tablets (7.5 mg total) by mouth 2 (two) times daily. 30 tablet 11  . escitalopram (LEXAPRO) 20 MG tablet Take 1 tablet (20 mg total) by mouth daily. 30 tablet 11  . norethindrone-ethinyl estradiol-iron (MICROGESTIN FE,GILDESS FE,LOESTRIN FE) 1.5-30 MG-MCG tablet Take 1 tablet by mouth daily.    . fluconazole (DIFLUCAN) 150 MG tablet 1 tab PO x 1, repeat in 3 days 2 tablet 0   No current facility-administered medications for this visit.     No Known Allergies  No family history on file.  Social History   Socioeconomic History  . Marital status: Widowed    Spouse name: Onalee Hua  . Number of children: 1  . Years of education: Not on file  . Highest education level: Not on file  Occupational History  . Occupation: exercise Administrator, arts: [employer]  Social Needs  . Financial resource strain: Not on file  . Food insecurity:    Worry: Not on file    Inability: Not on file  . Transportation needs:    Medical: Not on file    Non-medical: Not on file  Tobacco Use  . Smoking status: Never Smoker  . Smokeless tobacco: Never Used  Substance and Sexual Activity   . Alcohol use: No    Alcohol/week: 0.0 standard drinks  . Drug use: No  . Sexual activity: Not on file  Lifestyle  . Physical activity:    Days per week: Not on file    Minutes per session: Not on file  . Stress: Not on file  Relationships  . Social connections:    Talks on phone: Not on file    Gets together: Not on file    Attends religious service: Not on file    Active member of club or organization: Not on file    Attends meetings of clubs or organizations: Not on file    Relationship status: Not on file  . Intimate partner violence:    Fear of current or ex partner: Not on file    Emotionally abused: Not on file    Physically abused: Not on file    Forced sexual activity: Not on file  Other Topics Concern  . Not on file  Social History Narrative   Pt is married (Dr Karle Starch daughter in law) , and has a 84 mo old daughter   Stays home and also teaches exercise classes at [employer]    Plans on finishing a degree on line      Constitutional: Denies fever, malaise, fatigue, headache or abrupt weight changes.  Respiratory: Denies difficulty  breathing, shortness of breath, cough or sputum production.   Cardiovascular: Denies chest pain, chest tightness, palpitations or swelling in the hands or feet.  Gastrointestinal: Denies abdominal pain, bloating, constipation, diarrhea or blood in the stool.  GU: Pt reports vaginal itching, burning with urination. Denies urgency, frequency, pain with urination, blood in urine, odor or discharge.   No other specific complaints in a complete review of systems (except as listed in HPI above).     Objective:   Physical Exam   BP 122/78   Pulse 76   Temp 97.9 F (36.6 C) (Oral)   Wt 191 lb (86.6 kg)   LMP 06/26/2018   SpO2 98%   BMI 34.38 kg/m  Wt Readings from Last 3 Encounters:  07/03/18 191 lb (86.6 kg)  03/29/18 188 lb (85.3 kg)  06/07/17 187 lb 8 oz (85 kg)    General: Appears her stated age, well developed, well nourished  in NAD. Cardiovascular: Normal rate and rhythm.  Pulmonary/Chest: Normal effort and positive vesicular breath sounds. No respiratory distress. No wheezes, rales or ronchi noted.  Abdomen: Soft and nontender. Normal bowel sounds. No distention or masses noted.  GU: Self swabbed.   BMET No results found for: NA, K, CL, CO2, GLUCOSE, BUN, CREATININE, CALCIUM, GFRNONAA, GFRAA  Lipid Panel     Component Value Date/Time   CHOL 155 04/03/2013 1157   TRIG 66.0 04/03/2013 1157   HDL 67.50 04/03/2013 1157   CHOLHDL 2 04/03/2013 1157   VLDL 13.2 04/03/2013 1157   LDLCALC 74 04/03/2013 1157    CBC    Component Value Date/Time   WBC 11.8 (H) 10/09/2015 0551   RBC 3.63 (L) 10/09/2015 0551   HGB 10.6 (L) 10/09/2015 0551   HGB 12.0 07/15/2015 0903   HCT 31.9 (L) 10/09/2015 0551   HCT 35.2 07/15/2015 0903   PLT 217 10/09/2015 0551   PLT 216 07/15/2015 0903   MCV 87.9 10/09/2015 0551   MCV 90 07/15/2015 0903   MCH 29.2 10/09/2015 0551   MCHC 33.2 10/09/2015 0551   RDW 13.5 10/09/2015 0551   RDW 13.0 07/15/2015 0903   LYMPHSABS 2.6 07/15/2015 0903   MONOABS 0.5 04/03/2013 1157   EOSABS 0.2 07/15/2015 0903   BASOSABS 0.0 07/15/2015 0903    Hgb A1C No results found for: HGBA1C         Assessment & Plan:   Vaginal Itching:  Will send off wet prep Will obtain urine gonorrhea and chlamydia Consider vaginal candidiasis vs lichen sclerosis- discussed treatment of each  Will follow up after labs are back, return precautions discussed Nicki Reaper, NP

## 2018-07-06 NOTE — Patient Instructions (Signed)
Vaginitis    Vaginitis is irritation and swelling (inflammation) of the vagina. It happens when normal bacteria and yeast in the vagina grow too much. There are many types of this condition. Treatment will depend on the type you have.  Follow these instructions at home:  Lifestyle  · Keep your vagina area clean and dry.  ? Avoid using soap.  ? Rinse the area with water.  · Do not do the following until your doctor says it is okay:  ? Wash and clean out the vagina (douche).  ? Use tampons.  ? Have sex.  · Wipe from front to back after going to the bathroom.  · Let air reach your vagina.  ? Wear cotton underwear.  ? Do not wear:  ? Underwear while you sleep.  ? Tight pants.  ? Thong underwear.  ? Underwear or nylons without a cotton panel.  ? Take off any wet clothing, such as bathing suits, as soon as possible.  · Use gentle, non-scented products. Do not use things that can irritate the vagina, such as fabric softeners. Avoid the following products if they are scented:  ? Feminine sprays.  ? Detergents.  ? Tampons.  ? Feminine hygiene products.  ? Soaps or bubble baths.  · Practice safe sex and use condoms.  General instructions  · Take over-the-counter and prescription medicines only as told by your doctor.  · If you were prescribed an antibiotic medicine, take or use it as told by your doctor. Do not stop taking or using the antibiotic even if you start to feel better.  · Keep all follow-up visits as told by your doctor. This is important.  Contact a doctor if:  · You have pain in your belly.  · You have a fever.  · Your symptoms last for more than 2-3 days.  Get help right away if:  · You have a fever and your symptoms get worse all of a sudden.  Summary  · Vaginitis is irritation and swelling of the vagina. It can happen when the normal bacteria and yeast in the vagina grow too much. There are many types.  · Treatment will depend on the type you have.  · Do not douche, use tampons , or have sex until your health  care provider approves. When you can return to sex, practice safe sex and use condoms.  This information is not intended to replace advice given to you by your health care provider. Make sure you discuss any questions you have with your health care provider.  Document Released: 07/30/2008 Document Revised: 05/25/2016 Document Reviewed: 05/25/2016  Elsevier Interactive Patient Education © 2019 [employer]

## 2018-07-10 ENCOUNTER — Encounter: Payer: Self-pay | Admitting: Family Medicine

## 2018-07-10 MED ORDER — AMOXICILLIN-POT CLAVULANATE 875-125 MG PO TABS: 1.0000 | ORAL_TABLET | Freq: Two times a day (BID) | ORAL | 0 refills | Status: DC

## 2018-09-04 ENCOUNTER — Encounter: Payer: Self-pay | Admitting: Internal Medicine

## 2018-09-05 MED ORDER — FLUCONAZOLE 150 MG PO TABS: ORAL_TABLET | ORAL | 0 refills | Status: DC

## 2018-10-02 ENCOUNTER — Encounter: Payer: Self-pay | Admitting: Family Medicine

## 2018-10-03 ENCOUNTER — Encounter: Payer: Self-pay | Admitting: Family Medicine

## 2018-10-03 MED ORDER — EPINEPHRINE 0.3 MG/0.3ML IJ SOAJ: 0.3000 mg | INTRAMUSCULAR | 1 refills | Status: DC | PRN

## 2018-10-17 ENCOUNTER — Ambulatory Visit (INDEPENDENT_AMBULATORY_CARE_PROVIDER_SITE_OTHER): Payer: 59 | Admitting: Family Medicine

## 2018-10-17 ENCOUNTER — Encounter: Payer: Self-pay | Admitting: Family Medicine

## 2018-10-17 ENCOUNTER — Other Ambulatory Visit: Payer: Self-pay

## 2018-10-17 VITALS — BP 110/64 | HR 109 | Temp 98.0°F | Ht 61.75 in | Wt 191.2 lb

## 2018-10-17 DIAGNOSIS — F329 Major depressive disorder, single episode, unspecified: Secondary | ICD-10-CM

## 2018-10-17 DIAGNOSIS — F32A Depression, unspecified: Secondary | ICD-10-CM

## 2018-10-17 DIAGNOSIS — F419 Anxiety disorder, unspecified: Secondary | ICD-10-CM | POA: Diagnosis not present

## 2018-10-17 DIAGNOSIS — N898 Other specified noninflammatory disorders of vagina: Secondary | ICD-10-CM

## 2018-10-17 DIAGNOSIS — Z0000 Encounter for general adult medical examination without abnormal findings: Secondary | ICD-10-CM | POA: Diagnosis not present

## 2018-10-17 DIAGNOSIS — E669 Obesity, unspecified: Secondary | ICD-10-CM | POA: Diagnosis not present

## 2018-10-17 LAB — POCT WET PREP (WET MOUNT): Trichomonas Wet Prep HPF POC: ABSENT

## 2018-10-17 MED ORDER — SERTRALINE HCL 50 MG PO TABS: 75.0000 mg | ORAL_TABLET | Freq: Every day | ORAL | 11 refills | Status: DC

## 2018-10-17 MED ORDER — FLUCONAZOLE 150 MG PO TABS: 150.0000 mg | ORAL_TABLET | Freq: Once | ORAL | 0 refills | Status: DC

## 2018-10-17 NOTE — Assessment & Plan Note (Signed)
Scant yeast on wet prep  Seems to be cyclic- ? If vaginal pH issue  Disc use of otc products for this and probiotics/urged her to eat yogurt  Diflucan given today  Update if no imp  Also can f/u with gyn

## 2018-10-17 NOTE — Assessment & Plan Note (Signed)
Discussed how this problem influences overall health and the risks it imposes  Reviewed plan for weight loss with lower calorie diet (via better food choices and also portion control or program like weight watchers) and exercise building up to or more than 30 minutes 5 days per week including some aerobic activity   She is exercising well Plans to work on emotional eating with her counselor

## 2018-10-17 NOTE — Assessment & Plan Note (Signed)
Still struggling after loosing husband 3 y ago  Disc imp of self care She starts with new counselor tomorrow Commended good exercise  She plans to work on emotional eating Thinks she did better with zoloft-will change back to this from lexapro at dose of 75 mg daily  inst to update if worse or problems

## 2018-10-17 NOTE — Progress Notes (Signed)
Subjective:    Patient ID: Shawna Craig, female    DOB: 05-23-1985, 33 y.o.   MRN: [id number]  HPI Here for health maintenance exam and to review chronic medical problems    Weight Wt Readings from Last 3 Encounters:  10/17/18 191 lb 4 oz (86.8 kg)  07/03/18 191 lb (86.6 kg)  03/29/18 188 lb (85.3 kg)   35.26 kg/m   Doing ok  Just got her older child finished with kindergarten today  Trying to take care of herself    Mood  H/o depression /anxiety  PHQ-9 Depression screen [facility] 2/9 10/17/2018 03/29/2018  Decreased Interest 2 1  Down, Depressed, Hopeless 2 1  PHQ - 2 Score 4 2  Altered sleeping 3 2  Tired, decreased energy 2 -  Change in appetite 2 2  Feeling bad or failure about yourself  3 3  Trouble concentrating 1 1  Moving slowly or fidgety/restless 0 1  Suicidal thoughts 0 0  PHQ-9 Score 15 11  Difficult doing work/chores Very difficult -    She is starting up with a new counselor soon   Exercises 5-7 days per week but diet is horrible  She has gained approx 40 lb since her husband died (3rd year) Emotional eater - with lots of stress  Trying to achieve balance   Had a recent tick bite and then lump behind her L ear   Takes lexapro  It is helpful  She still has a week or two when she just does not care about anything  Did not have that with zoloft  She took buspar for a little while- stopped it /not to much of a difference  More depression than anxiety now    Interested in maintenance labs today   BP Readings from Last 3 Encounters:  10/17/18 110/64  07/03/18 122/78  03/29/18 106/68   Pulse Readings from Last 3 Encounters:  10/17/18 (!) 109  07/03/18 76  03/29/18 68   Pulse is high today   Menses- spots 2-3 d and then bleeds for 3 days  Not heavy or painful  Wants to stay on current oral contraceptive  Gyn care  Last pap -January/normal  She sees Dr Janee Morn at [facility]  Before her period -she gets vulvar itching /discomfort    Some burning today  She has seen Rene Kocher in the past for yeast infection   Wet prep today Results for orders placed or performed in visit on 10/17/18  HM PAP SMEAR  Result Value Ref Range   HM Pap smear normal at gyn   POCT Wet Prep [facility])  Result Value Ref Range   Source Wet Prep POC vaginal    WBC, Wet Prep HPF POC mod    Bacteria Wet Prep HPF POC Moderate (A) Few   BACTERIA WET PREP MORPHOLOGY POC     Clue Cells Wet Prep HPF POC None None   Clue Cells Wet Prep Whiff POC     Yeast Wet Prep HPF POC Few (A) None   KOH Wet Prep POC Few (A) None   Trichomonas Wet Prep HPF POC Absent Absent     Does not desire STD testing   Worse with rough toilet paper right now   Self breast exam -no lumps   Had flu shot 10/19 Tetanus shot 3/17 Hiv screen neg 2013  Patient Active Problem List   Diagnosis Date Noted   Routine general medical examination at a health care facility 10/17/2018  Itching in the vaginal area 10/17/2018   Anxiety and depression 04/12/2016   Hidradenitis suppurativa 02/03/2016   Obesity (BMI 30-39.9) 01/27/2016   Labor and delivery indication for care or intervention 10/08/2015   PROM (premature rupture of membranes) 10/08/2015   Normal vaginal delivery 10/08/2015   Fatigue 07/15/2015   History reviewed. No pertinent past medical history. Past Surgical History:  Procedure Laterality Date   TONSILLECTOMY AND ADENOIDECTOMY     2007   WISDOM TOOTH EXTRACTION     Social History   Tobacco Use   Smoking status: Never Smoker   Smokeless tobacco: Never Used  Substance Use Topics   Alcohol use: No    Alcohol/week: 0.0 standard drinks   Drug use: No   History reviewed. No pertinent family history. No Known Allergies Current Outpatient Medications on File Prior to Visit  Medication Sig Dispense Refill   EPINEPHrine 0.3 mg/0.3 mL IJ SOAJ injection Inject 0.3 mLs (0.3 mg total) into the muscle as needed for anaphylaxis. 1 Device 1    norethindrone-ethinyl estradiol-iron (MICROGESTIN FE,GILDESS FE,LOESTRIN FE) 1.5-30 MG-MCG tablet Take 1 tablet by mouth daily.     ALPRAZolam (XANAX) 0.5 MG tablet Take 1-2 tablets (0.5-1 mg total) by mouth at bedtime as needed for anxiety. With caution of sedation (Patient not taking: Reported on 10/17/2018) 60 tablet 1   No current facility-administered medications on file prior to visit.     Review of Systems  Constitutional: Positive for fatigue. Negative for activity change, appetite change, fever and unexpected weight change.  HENT: Negative for congestion, ear pain, rhinorrhea, sinus pressure and sore throat.   Eyes: Negative for pain, redness and visual disturbance.  Respiratory: Negative for cough, shortness of breath and wheezing.   Cardiovascular: Negative for chest pain and palpitations.  Gastrointestinal: Negative for abdominal pain, blood in stool, constipation and diarrhea.  Endocrine: Negative for polydipsia and polyuria.  Genitourinary: Negative for dysuria, frequency, menstrual problem, pelvic pain, urgency and vaginal discharge.       Vaginal itching/discomfort  Musculoskeletal: Negative for arthralgias, back pain and myalgias.  Skin: Negative for pallor and rash.  Allergic/Immunologic: Negative for environmental allergies.  Neurological: Negative for dizziness, syncope and headaches.  Hematological: Negative for adenopathy. Does not bruise/bleed easily.  Psychiatric/Behavioral: Positive for dysphoric mood and sleep disturbance. Negative for decreased concentration, self-injury and suicidal ideas. The patient is nervous/anxious.        Objective:   Physical Exam Constitutional:      General: She is not in acute distress.    Appearance: She is well-developed. She is obese. She is not ill-appearing.  HENT:     Head: Normocephalic and atraumatic.     Right Ear: Tympanic membrane, ear canal and external ear normal.     Left Ear: Tympanic membrane, ear canal and external  ear normal.     Nose: Nose normal.     Mouth/Throat:     Mouth: Mucous membranes are moist.     Pharynx: Oropharynx is clear. No posterior oropharyngeal erythema.  Eyes:     General: No scleral icterus.    Conjunctiva/sclera: Conjunctivae normal.     Pupils: Pupils are equal, round, and reactive to light.  Neck:     Musculoskeletal: Normal range of motion and neck supple. No muscular tenderness.     Thyroid: No thyromegaly.     Vascular: No carotid bruit or JVD.  Cardiovascular:     Rate and Rhythm: Regular rhythm. Tachycardia present.     Pulses: Normal pulses.  Heart sounds: Normal heart sounds. No gallop.   Pulmonary:     Effort: Pulmonary effort is normal. No respiratory distress.     Breath sounds: Normal breath sounds. No wheezing.  Chest:     Chest wall: No tenderness.  Abdominal:     General: Bowel sounds are normal. There is no distension or abdominal bruit.     Palpations: Abdomen is soft. There is no mass.     Tenderness: There is no abdominal tenderness.     Hernia: No hernia is present.  Genitourinary:    Comments: Some mild hyperemia of vaginal mucosa  No vaginal d/c Musculoskeletal: Normal range of motion.        General: No tenderness.     Right lower leg: No edema.     Left lower leg: No edema.  Lymphadenopathy:     Cervical: No cervical adenopathy.  Skin:    General: Skin is warm and dry.     Coloration: Skin is not pale.     Findings: No erythema or rash.     Comments: Few lentigines  Neurological:     General: No focal deficit present.     Mental Status: She is alert.     Cranial Nerves: No cranial nerve deficit.     Motor: No abnormal muscle tone.     Coordination: Coordination normal.     Deep Tendon Reflexes: Reflexes are normal and symmetric. Reflexes normal.  Psychiatric:        Attention and Perception: She is attentive.        Mood and Affect: Mood is depressed. Affect is not tearful.        Speech: Speech normal.        Behavior:  Behavior normal.        Thought Content: Thought content normal.     Comments: Voices depression today           Assessment & Plan:   Problem List Items Addressed This Visit      Musculoskeletal and Integument   Itching in the vaginal area    Scant yeast on wet prep  Seems to be cyclic- ? If vaginal pH issue  Disc use of otc products for this and probiotics/urged her to eat yogurt  Diflucan given today  Update if no imp  Also can f/u with gyn      Relevant Orders   POCT Wet Prep [facility]) (Completed)     Other   Obesity (BMI 30-39.9)    Discussed how this problem influences overall health and the risks it imposes  Reviewed plan for weight loss with lower calorie diet (via better food choices and also portion control or program like weight watchers) and exercise building up to or more than 30 minutes 5 days per week including some aerobic activity   She is exercising well Plans to work on emotional eating with her counselor      Anxiety and depression    Still struggling after loosing husband 3 y ago  Disc imp of self care She starts with new counselor tomorrow Commended good exercise  She plans to work on emotional eating Thinks she did better with zoloft-will change back to this from lexapro at dose of 75 mg daily  inst to update if worse or problems       Relevant Medications   sertraline (ZOLOFT) 50 MG tablet   Routine general medical examination at a health care facility - Primary    Reviewed health  habits including diet and exercise and skin cancer prevention Reviewed appropriate screening tests for age  Also reviewed health mt list, fam hx and immunization status , as well as social and family history   Labs ordered for wellness Disc self care-great exercise Sees gyn as well  Plan made for ongoing counseling and also better diet       Relevant Orders   CBC with Differential/Platelet   Comprehensive metabolic panel   Lipid panel   TSH

## 2018-10-17 NOTE — Patient Instructions (Signed)
Start with your new counselor Stop lexapro and start back on zoloft 75 mg daily  Continue exercise and good self care   Labs today  Take diflucan for yeast   Get a product over the counter to help vaginal PH "rephresh" -or another brand Eat yogurt every day

## 2018-10-17 NOTE — Assessment & Plan Note (Signed)
Reviewed health habits including diet and exercise and skin cancer prevention Reviewed appropriate screening tests for age  Also reviewed health mt list, fam hx and immunization status , as well as social and family history   Labs ordered for wellness Disc self care-great exercise Sees gyn as well  Plan made for ongoing counseling and also better diet

## 2018-10-18 LAB — LIPID PANEL
Cholesterol: 186 mg/dL (ref 0–200)
HDL: 74.4 mg/dL (ref >39.00)
LDL Cholesterol: 97 mg/dL (ref 0–99)
NonHDL: 112.03
Total CHOL/HDL Ratio: 3
Triglycerides: 74 mg/dL (ref 0.0–149.0)
VLDL: 14.8 mg/dL (ref 0.0–40.0)

## 2018-10-18 LAB — CBC WITH DIFFERENTIAL/PLATELET
Basophils Absolute: 0.1 10*3/uL (ref 0.0–0.1)
Basophils Relative: 0.8 % (ref 0.0–3.0)
Eosinophils Absolute: 0.1 10*3/uL (ref 0.0–0.7)
Eosinophils Relative: 1 % (ref 0.0–5.0)
HCT: 41.9 % (ref 36.0–46.0)
Hemoglobin: 13.7 g/dL (ref 12.0–15.0)
Lymphocytes Relative: 26.7 % (ref 12.0–46.0)
Lymphs Abs: 2.3 10*3/uL (ref 0.7–4.0)
MCHC: 32.7 g/dL (ref 30.0–36.0)
MCV: 90.7 fl (ref 78.0–100.0)
Monocytes Absolute: 0.6 10*3/uL (ref 0.1–1.0)
Monocytes Relative: 7.4 % (ref 3.0–12.0)
Neutro Abs: 5.5 10*3/uL (ref 1.4–7.7)
Neutrophils Relative %: 64.1 % (ref 43.0–77.0)
Platelets: 278 10*3/uL (ref 150.0–400.0)
RBC: 4.62 Mil/uL (ref 3.87–5.11)
RDW: 13.9 % (ref 11.5–15.5)
WBC: 8.6 10*3/uL (ref 4.0–10.5)

## 2018-10-18 LAB — COMPREHENSIVE METABOLIC PANEL
ALT: 10 U/L (ref 0–35)
AST: 16 U/L (ref 0–37)
Albumin: 4 g/dL (ref 3.5–5.2)
Alkaline Phosphatase: 65 U/L (ref 39–117)
BUN: 17 mg/dL (ref 6–23)
CO2: 28 mEq/L (ref 19–32)
Calcium: 9.5 mg/dL (ref 8.4–10.5)
Chloride: 103 mEq/L (ref 96–112)
Creatinine, Ser: 0.82 mg/dL (ref 0.40–1.20)
GFR: 81.02 mL/min (ref >60.00)
Glucose, Bld: 69 mg/dL — ABNORMAL LOW (ref 70–99)
Potassium: 4.5 mEq/L (ref 3.5–5.1)
Sodium: 139 mEq/L (ref 135–145)
Total Bilirubin: 0.4 mg/dL (ref 0.2–1.2)
Total Protein: 7.2 g/dL (ref 6.0–8.3)

## 2018-10-18 LAB — TSH: TSH: 1.25 u[IU]/mL (ref 0.35–4.50)

## 2018-10-24 ENCOUNTER — Encounter: Payer: Self-pay | Admitting: Family Medicine

## 2018-10-25 MED ORDER — SERTRALINE HCL 50 MG PO TABS: 75.0000 mg | ORAL_TABLET | Freq: Every day | ORAL | 11 refills | Status: DC

## 2018-11-14 ENCOUNTER — Encounter: Payer: Self-pay | Admitting: Family Medicine

## 2018-11-15 NOTE — Telephone Encounter (Signed)
Pt scheduled 11/16/18 with Dr Glori Bickers for Doxy at 10:15 - pt aware that she may be seen earlier around 10am - aware to be available around that time just in case. Nothing further needed.

## 2018-11-16 ENCOUNTER — Encounter: Payer: Self-pay | Admitting: Family Medicine

## 2018-11-16 ENCOUNTER — Ambulatory Visit (INDEPENDENT_AMBULATORY_CARE_PROVIDER_SITE_OTHER): Payer: 59 | Admitting: Family Medicine

## 2018-11-16 DIAGNOSIS — J0101 Acute recurrent maxillary sinusitis: Secondary | ICD-10-CM

## 2018-11-16 DIAGNOSIS — J019 Acute sinusitis, unspecified: Secondary | ICD-10-CM | POA: Insufficient documentation

## 2018-11-16 MED ORDER — AMOXICILLIN-POT CLAVULANATE 875-125 MG PO TABS: 1.0000 | ORAL_TABLET | Freq: Two times a day (BID) | ORAL | 0 refills | Status: DC

## 2018-11-16 MED ORDER — FLUTICASONE PROPIONATE 50 MCG/ACT NA SUSP: 2.0000 | Freq: Every day | NASAL | 11 refills | Status: DC

## 2018-11-16 MED ORDER — PREDNISONE 10 MG PO TABS: ORAL_TABLET | ORAL | 0 refills | Status: DC

## 2018-11-16 NOTE — Assessment & Plan Note (Signed)
Suspect acute on chronic in regular afrin user with some allergies  tx with augmentin 10 days  Also prednisone taper 30 mg  flonase ns qd indefinitely  Enc strongly to stop afrin and alert [place] if she cannot  Low threshold for ENT visit if needed  Watch for fever/sob or covid symptoms

## 2018-11-16 NOTE — Progress Notes (Signed)
Virtual Visit via Video Note  I connected with Shawna Craig on 11/16/18 at 10:15 AM EDT by a video enabled telemedicine application and verified that I am speaking with the correct person using two identifiers.  Location: Patient: home Provider: office    I discussed the limitations of evaluation and management by telemedicine and the availability of in person appointments. The patient expressed understanding and agreed to proceed.  History of Present Illness: Feels like she may have a sinus infection   Prod cough - phlegm with color (yellow) Pressure/pain in face and teeth - worse around L eye now  Both sides of jaw and cheeks   Prone to sinusitis Has seen ENT in the past    Nasal drainage- not getting a lot out but very congested   Taking some tylenol   Nasal spray - afrin  Pretty regularly -aware of the side eff  Has some allergies also - pollen causes tickle in her throat also   No known exp to covid Does not feel bad  Still has energy  No fever/ no sob   Review of Systems  Constitutional: Positive for malaise/fatigue. Negative for chills, fever and weight loss.  HENT: Positive for congestion and sinus pain. Negative for ear discharge, ear pain and sore throat.   Eyes: Negative for blurred vision, discharge and redness.  Respiratory: Positive for cough. Negative for sputum production, shortness of breath, wheezing and stridor.   Cardiovascular: Negative for chest pain and palpitations.  Gastrointestinal: Negative for abdominal pain, nausea and vomiting.  Musculoskeletal: Negative for myalgias.  Skin: Negative for rash.  Neurological: Positive for headaches. Negative for dizziness.      Patient Active Problem List   Diagnosis Date Noted  . Acute sinusitis 11/16/2018  . Routine general medical examination at a health care facility 10/17/2018  . Itching in the vaginal area 10/17/2018  . Anxiety and depression 04/12/2016  . Hidradenitis suppurativa 02/03/2016   . Obesity (BMI 30-39.9) 01/27/2016  . Labor and delivery indication for care or intervention 10/08/2015  . PROM (premature rupture of membranes) 10/08/2015  . Normal vaginal delivery 10/08/2015  . Fatigue 07/15/2015   No past medical history on file. Past Surgical History:  Procedure Laterality Date  . TONSILLECTOMY AND ADENOIDECTOMY     2007  . WISDOM TOOTH EXTRACTION     Social History   Tobacco Use  . Smoking status: Never Smoker  . Smokeless tobacco: Never Used  Substance Use Topics  . Alcohol use: No    Alcohol/week: 0.0 standard drinks  . Drug use: No   No family history on file. No Known Allergies Current Outpatient Medications on File Prior to Visit  Medication Sig Dispense Refill  . EPINEPHrine 0.3 mg/0.3 mL IJ SOAJ injection Inject 0.3 mLs (0.3 mg total) into the muscle as needed for anaphylaxis. 1 Device 1  . norethindrone-ethinyl estradiol-iron (MICROGESTIN FE,GILDESS FE,LOESTRIN FE) 1.5-30 MG-MCG tablet Take 1 tablet by mouth daily.    . sertraline (ZOLOFT) 50 MG tablet Take 1.5 tablets (75 mg total) by mouth daily. 45 tablet 11  . ALPRAZolam (XANAX) 0.5 MG tablet Take 1-2 tablets (0.5-1 mg total) by mouth at bedtime as needed for anxiety. With caution of sedation (Patient not taking: Reported on 11/16/2018) 60 tablet 1   No current facility-administered medications on file prior to visit.     Observations/Objective: Patient appears well, in no distress Weight is baseline  No facial swelling or asymmetry Normal voice-not hoarse and no slurred speech-but does  sound nasally congested  No obvious tremor or mobility impairment Moving neck and UEs normally Able to hear the call well  No cough or shortness of breath during interview  Talkative and mentally sharp with no cognitive changes No skin changes on face or neck , no rash or pallor Affect is normal    Assessment and Plan: Problem List Items Addressed This Visit      Respiratory   Acute sinusitis     Suspect acute on chronic in regular afrin user with some allergies  tx with augmentin 10 days  Also prednisone taper 30 mg  flonase ns qd indefinitely  Enc strongly to stop afrin and alert us if she cannot  Low threshold for ENT visit if needed  Watch for fever/sob or covid symptoms      Relevant Medications   amoxicillin-clavulanate (AUGMENTIN) 875-125 MG tablet   predniSONE (DELTASONE) 10 MG tablet   fluticasone (FLONASE) 50 MCG/ACT nasal spray       Follow Up Instructions: Drink fluids Stop afrin due to rebound congestion and stay off of it  Use flonase daily  Take augmentin and prednisone for acute sinus infection   Update if not starting to improve in a week or if worsening  If sob/cough or other symptoms of covid please alert us asap    I discussed the assessment and treatment plan with the patient. The patient was provided an opportunity to ask questions and all were answered. The patient agreed with the plan and demonstrated an understanding of the instructions.   The patient was advised to call back or seek an in-person evaluation if the symptoms worsen or if the condition fails to improve as anticipated.     Roxy MannsMarne Marthe Dant, MD

## 2018-11-19 NOTE — Patient Instructions (Signed)
Drink fluids Stop afrin due to rebound congestion and stay off of it  Use flonase daily  Take augmentin and prednisone for acute sinus infection   Update if not starting to improve in a week or if worsening  If sob/cough or other symptoms of covid please alert [place] asap

## 2018-12-04 ENCOUNTER — Encounter: Payer: Self-pay | Admitting: Family Medicine

## 2018-12-04 MED ORDER — FLUCONAZOLE 150 MG PO TABS: 150.0000 mg | ORAL_TABLET | Freq: Once | ORAL | 0 refills | Status: DC

## 2018-12-05 MED ORDER — SERTRALINE HCL 100 MG PO TABS: 100.0000 mg | ORAL_TABLET | Freq: Every day | ORAL | 11 refills | Status: DC

## 2018-12-05 NOTE — Addendum Note (Signed)
Addended by: Loura Pardon A on: 12/05/2018 09:36 PM   Modules accepted: Orders

## 2018-12-07 ENCOUNTER — Encounter: Payer: Self-pay | Admitting: Family Medicine

## 2018-12-07 MED ORDER — TERCONAZOLE 0.8 % VA CREA: TOPICAL_CREAM | VAGINAL | 0 refills | Status: DC

## 2018-12-19 ENCOUNTER — Encounter: Payer: Self-pay | Admitting: Family Medicine

## 2018-12-20 MED ORDER — SERTRALINE HCL 100 MG PO TABS: 150.0000 mg | ORAL_TABLET | Freq: Every day | ORAL | 11 refills | Status: DC

## 2019-01-04 ENCOUNTER — Encounter: Payer: Self-pay | Admitting: Family Medicine

## 2019-01-04 MED ORDER — FLUCONAZOLE 150 MG PO TABS: 150.0000 mg | ORAL_TABLET | Freq: Once | ORAL | 3 refills | Status: AC

## 2019-01-06 ENCOUNTER — Encounter: Payer: Self-pay | Admitting: Family Medicine

## 2019-01-06 DIAGNOSIS — N898 Other specified noninflammatory disorders of vagina: Secondary | ICD-10-CM

## 2019-01-09 NOTE — Telephone Encounter (Signed)
Referral done to gyn  Will send to [facility] Thanks

## 2019-01-10 ENCOUNTER — Telehealth: Payer: Self-pay | Admitting: Obstetrics & Gynecology

## 2019-01-10 NOTE — Telephone Encounter (Signed)
[facility] referring for Itching in the vaginal area. Called and left voicemail for patient to call back to be schedule

## 2019-01-10 NOTE — Telephone Encounter (Signed)
Patient scheduled 9/1 with [facility]

## 2019-01-16 ENCOUNTER — Ambulatory Visit (INDEPENDENT_AMBULATORY_CARE_PROVIDER_SITE_OTHER): Payer: 59 | Admitting: Obstetrics and Gynecology

## 2019-01-16 ENCOUNTER — Other Ambulatory Visit: Payer: Self-pay

## 2019-01-16 ENCOUNTER — Encounter: Payer: Self-pay | Admitting: Obstetrics and Gynecology

## 2019-01-16 VITALS — BP 124/80 | Ht 63.0 in | Wt 197.0 lb

## 2019-01-16 DIAGNOSIS — B373 Candidiasis of vulva and vagina: Secondary | ICD-10-CM | POA: Diagnosis not present

## 2019-01-16 DIAGNOSIS — A630 Anogenital (venereal) warts: Secondary | ICD-10-CM | POA: Insufficient documentation

## 2019-01-16 DIAGNOSIS — B3731 Acute candidiasis of vulva and vagina: Secondary | ICD-10-CM

## 2019-01-16 DIAGNOSIS — N949 Unspecified condition associated with female genital organs and menstrual cycle: Secondary | ICD-10-CM

## 2019-01-16 DIAGNOSIS — N9489 Other specified conditions associated with female genital organs and menstrual cycle: Secondary | ICD-10-CM | POA: Insufficient documentation

## 2019-01-16 NOTE — Progress Notes (Signed)
Tower, Wynelle Fanny, MD   Chief Complaint  Patient presents with  . Vaginitis    In Jan started getting yeast infections alot, feels like vaginal area gets raw and starts burning with urine hit the area     HPI:      Shawna Craig is a 33 y.o. [id number] who LMP was Patient's last menstrual period was 01/02/2019 (approximate)., presents today for NP recurrent yeast vag, referrred by PCP. Pt's sx are usually vaginal skin irritation and a burning sensation with urination; recurrent since 1/20. Pt denies increase d/c or odor. No other UTI sx, LBP, pelvic pain, fevers. Has vaginal irritation with sex when has sx. Taking probiotics and eating yogurt. No sx today.  Has had several wet preps with PCP that confirmed yeast. Treated with diflucan multiple times and terazol-3 7/20. Sx often occur before menses.  Pt thinks may have had abx before sx start. Also had new sex partner when sx started but had neg STD testing 2/20. Pt uses scented body wash and dryer sheets, as well as satin underwear sometimes. 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   Pt on OCPs with monthly menses. Hx of HPV on pap but no genital lesions in past. S/p Gardasil vaccine. Hx of hydradenitis suppurative vulvar area.    Past Medical History:  Diagnosis Date  . Cervical high risk human papillomavirus (HPV) DNA test positive   . Hidradenitis suppurativa   . Vaccine for human papilloma virus (HPV) types 6, 11, 16, and 18 administered     Past Surgical History:  Procedure Laterality Date  . TONSILLECTOMY AND ADENOIDECTOMY     2007  . WISDOM TOOTH EXTRACTION      History reviewed. No pertinent family history.  Social History   Socioeconomic History  . Marital status: Widowed    Spouse name: Shanon Brow  . Number of children: 1  . Years of education: Not on file  . Highest education level: Not on file  Occupational History  . Occupation: exercise Associate Professor: [employer]  Social Needs  . Financial  resource strain: Not on file  . Food insecurity    Worry: Not on file    Inability: Not on file  . Transportation needs    Medical: Not on file    Non-medical: Not on file  Tobacco Use  . Smoking status: Never Smoker  . Smokeless tobacco: Never Used  Substance and Sexual Activity  . Alcohol use: No    Alcohol/week: 0.0 standard drinks  . Drug use: No  . Sexual activity: Yes    Birth control/protection: Pill  Lifestyle  . Physical activity    Days per week: Not on file    Minutes per session: Not on file  . Stress: Not on file  Relationships  . Social Herbalist on phone: Not on file    Gets together: Not on file    Attends religious service: Not on file    Active member of club or organization: Not on file    Attends meetings of clubs or organizations: Not on file    Relationship status: Not on file  . Intimate partner violence    Fear of current or ex partner: Not on file    Emotionally abused: Not on file    Physically abused: Not on file    Forced sexual activity: Not on file  Other Topics Concern  . Not on file  Social History Narrative   Pt is married (Dr Karle Starch daughter in law) , and has a 34 mo old daughter   Stays home and also teaches exercise classes at [employer]    Plans on finishing a degree on line     Outpatient Medications Prior to Visit  Medication Sig Dispense Refill  . ALPRAZolam (XANAX) 0.5 MG tablet Take 1-2 tablets (0.5-1 mg total) by mouth at bedtime as needed for anxiety. With caution of sedation 60 tablet 1  . EPINEPHrine 0.3 mg/0.3 mL IJ SOAJ injection Inject 0.3 mLs (0.3 mg total) into the muscle as needed for anaphylaxis. 1 Device 1  . norethindrone-ethinyl estradiol-iron (MICROGESTIN FE,GILDESS FE,LOESTRIN FE) 1.5-30 MG-MCG tablet Take 1 tablet by mouth daily.    . sertraline (ZOLOFT) 100 MG tablet Take 1.5 tablets (150 mg total) by mouth daily. 45 tablet 11  . sertraline (ZOLOFT) 50 MG tablet     . terconazole (TERAZOL 3) 0.8 %  vaginal cream Apply externally to irritated area once daily 20 g 0  . amoxicillin-clavulanate (AUGMENTIN) 875-125 MG tablet Take 1 tablet by mouth 2 (two) times daily. 20 tablet 0  . fluticasone (FLONASE) 50 MCG/ACT nasal spray Place 2 sprays into both nostrils daily. 16 g 11  . predniSONE (DELTASONE) 10 MG tablet Take 3 pills once daily by mouth for 3 days, then 2 pills once daily for 3 days, then 1 pill once daily for 3 days and then stop 18 tablet 0   No facility-administered medications prior to visit.       ROS:  Review of Systems  Constitutional: Negative for fatigue, fever and unexpected weight change.  Respiratory: Negative for cough, shortness of breath and wheezing.   Cardiovascular: Negative for chest pain, palpitations and leg swelling.  Gastrointestinal: Negative for blood in stool, constipation, diarrhea, nausea and vomiting.  Endocrine: Negative for cold intolerance, heat intolerance and polyuria.  Genitourinary: Positive for dyspareunia and dysuria. Negative for flank pain, frequency, genital sores, hematuria, menstrual problem, pelvic pain, urgency, vaginal bleeding, vaginal discharge and vaginal pain.  Musculoskeletal: Negative for back pain, joint swelling and myalgias.  Skin: Negative for rash.  Neurological: Negative for dizziness, syncope, light-headedness, numbness and headaches.  Hematological: Negative for adenopathy.  Psychiatric/Behavioral: Negative for agitation, confusion, sleep disturbance and suicidal ideas. The patient is not nervous/anxious.   BREAST: No symptoms   OBJECTIVE:   Vitals:  BP 124/80   Ht 5\' 3"  (1.6 m)   Wt 197 lb (89.4 kg)   LMP 01/02/2019 (Approximate)   BMI 34.90 kg/m   Physical Exam Vitals signs reviewed.  Constitutional:      Appearance: She is well-developed.  Neck:     Musculoskeletal: Normal range of motion.  Pulmonary:     Effort: Pulmonary effort is normal.  Genitourinary:    Labia:        Right: No rash,  tenderness or lesion.        Left: No rash, tenderness or lesion.      Comments: SEVERAL EXT WARTY LESIONS PERIANAL AREA Musculoskeletal: Normal range of motion.  Skin:    General: Skin is warm and dry.  Neurological:     General: No focal deficit present.     Mental Status: She is alert and oriented to person, place, and time.     Cranial Nerves: No cranial nerve deficit.  Psychiatric:        Mood and Affect: Mood normal.        Behavior: Behavior normal.  Thought Content: Thought content normal.        Judgment: Judgment normal.      Assessment/Plan: Vaginal burning - Plan: Other/Misc lab test; Discussed etiology of sx including chemical vs yeast vs both. Dove sens skin soap, line dry underwear, cotton underwear, change out of damp underwear when possible. Check One swab culture for yeast and AV. Will f/u with results. Try OTC hydrocortisone crm ext BID when has sx next to see if help. If culture pos, will treat with terazol-7. Also discussed terazol tx 1 night the week before menses as preventive.   Yeast vaginitis - Plan: Other/Misc lab test; recurrent sx. Check culture. If neg, RTO with sx for further eval.   Genital warts--3 Lesions treated with TCA. Wash in 4-6 hrs/wound care. F/u prn.      Return if symptoms worsen or fail to improve.   B. , PA-C 01/16/2019 4:09 PM

## 2019-01-16 NOTE — Patient Instructions (Signed)
I value your feedback and entrusting [place] with your care. If you get a [facility] patient survey, I would appreciate you taking the time to let us know about your experience today. Thank you!  HEALTHY VAGINAL HYGIENE  AVOID   Panytyhose Synthetic underwear (wear COTTON underwear)  Tight pants/jeans Thongs Pantyliners Scented soaps/shower gels (use Dove Sensitive Skin soap or water to clean) Bubble bath/bath bombs Scented detergents  ALL dryer sheets (line dry underwear if using them on your other clothing) Feminine sprays/douches  ADD PROBIOTICS DAILY

## 2019-01-23 ENCOUNTER — Telehealth: Payer: Self-pay | Admitting: Obstetrics and Gynecology

## 2019-01-23 DIAGNOSIS — B3731 Acute candidiasis of vulva and vagina: Secondary | ICD-10-CM

## 2019-01-23 DIAGNOSIS — N949 Unspecified condition associated with female genital organs and menstrual cycle: Secondary | ICD-10-CM

## 2019-01-23 DIAGNOSIS — B373 Candidiasis of vulva and vagina: Secondary | ICD-10-CM

## 2019-01-23 MED ORDER — TERCONAZOLE 0.4 % VA CREA: 1.0000 | TOPICAL_CREAM | Freq: Every day | VAGINAL | 0 refills | Status: DC

## 2019-01-23 MED ORDER — MOXIFLOXACIN HCL 400 MG PO TABS: 400.0000 mg | ORAL_TABLET | Freq: Every day | ORAL | 0 refills | Status: AC

## 2019-01-23 NOTE — Telephone Encounter (Signed)
Pt aware of candida albicans and GBS on MDL One Swab culture. Rx terazol-7 and avelox eRxd. F/u prn sx. May need terazol once wkly before menses as preventive if sx persist.

## 2019-01-29 ENCOUNTER — Encounter: Payer: Self-pay | Admitting: Obstetrics and Gynecology

## 2019-01-31 ENCOUNTER — Encounter: Payer: Self-pay | Admitting: Obstetrics and Gynecology

## 2019-02-02 ENCOUNTER — Other Ambulatory Visit: Payer: Self-pay | Admitting: Obstetrics and Gynecology

## 2019-02-02 ENCOUNTER — Encounter: Payer: Self-pay | Admitting: Obstetrics and Gynecology

## 2019-02-02 DIAGNOSIS — B373 Candidiasis of vulva and vagina: Secondary | ICD-10-CM

## 2019-02-02 DIAGNOSIS — B3731 Acute candidiasis of vulva and vagina: Secondary | ICD-10-CM

## 2019-02-02 MED ORDER — TERCONAZOLE 0.4 % VA CREA: 1.0000 | TOPICAL_CREAM | Freq: Every day | VAGINAL | 0 refills | Status: DC

## 2019-02-05 ENCOUNTER — Other Ambulatory Visit: Payer: Self-pay

## 2019-02-05 DIAGNOSIS — Z20822 Contact with and (suspected) exposure to covid-19: Secondary | ICD-10-CM

## 2019-02-05 NOTE — Progress Notes (Unsigned)
la 

## 2019-02-07 LAB — NOVEL CORONAVIRUS, NAA: SARS-CoV-2, NAA: NOT DETECTED

## 2019-04-18 ENCOUNTER — Other Ambulatory Visit: Payer: Self-pay | Admitting: Obstetrics and Gynecology

## 2019-04-18 DIAGNOSIS — Z3040 Encounter for surveillance of contraceptives, unspecified: Secondary | ICD-10-CM

## 2019-04-19 ENCOUNTER — Telehealth: Payer: Self-pay

## 2019-04-19 MED ORDER — NORETHIN ACE-ETH ESTRAD-FE 1.5-30 MG-MCG PO TABS: 1.0000 | ORAL_TABLET | Freq: Every day | ORAL | 1 refills | Status: DC

## 2019-04-19 NOTE — Telephone Encounter (Signed)
Pt wants ABC to be her GYN Dr and she wants to know if she could refill her [facility], she seen ABC in September of this year.

## 2019-04-19 NOTE — Telephone Encounter (Signed)
Rx eRxd till 6/21 when annual is due. Pls notify pt. Thx

## 2019-04-20 NOTE — Telephone Encounter (Signed)
Called pt, no answer, LVMTRC. 

## 2019-04-20 NOTE — Telephone Encounter (Signed)
Patient aware.

## 2019-05-14 ENCOUNTER — Encounter: Payer: Self-pay | Admitting: Obstetrics and Gynecology

## 2019-05-15 ENCOUNTER — Other Ambulatory Visit: Payer: Self-pay | Admitting: Obstetrics and Gynecology

## 2019-05-15 DIAGNOSIS — B3731 Acute candidiasis of vulva and vagina: Secondary | ICD-10-CM

## 2019-05-15 DIAGNOSIS — B373 Candidiasis of vulva and vagina: Secondary | ICD-10-CM

## 2019-05-15 MED ORDER — TERCONAZOLE 0.4 % VA CREA: 1.0000 | TOPICAL_CREAM | Freq: Every day | VAGINAL | 0 refills | Status: DC

## 2019-05-15 NOTE — Progress Notes (Signed)
Rx RF terazol prn yeast vag sx

## 2019-06-07 ENCOUNTER — Encounter: Payer: Self-pay | Admitting: Family Medicine

## 2019-06-12 ENCOUNTER — Telehealth: Payer: Self-pay

## 2019-06-12 NOTE — Telephone Encounter (Signed)
Pt called to say she has had indirect covid exposure: Brother was with parents on Saturday. She was with parents on Sunday. Brother tested positive today. She is asking if she can still do her visit with Dr Patsy Lager tomorrow since Dr Alphonsus Sias said they did not need to quarantine unless her parents begin to have symptoms.  [name] will get with Dr Patsy Lager tomorrow before her appt and verify what he would like to do. I advised [name].

## 2019-06-12 NOTE — Progress Notes (Deleted)
     Shawna Majkowski T. [name], MD [facility] at [facility] [address] [place], [address] Phone: [phone]  FAX: [phone]  Shawna Craig - 34 y.o. female  MRN [id number]  Date of Birth: [birth year]  Visit Date: 06/13/2019  PCP: Shawna Pimple, MD  Referred by: Shawna Craig, Shawna Gallus, MD  No chief complaint on file.   This visit occurred during the SARS-CoV-2 public health emergency.  Safety protocols were in place, including screening questions prior to the visit, additional usage of staff PPE, and extensive cleaning of exam room while observing appropriate contact time as indicated for disinfecting solutions.   Subjective:   Shawna Craig is a 33 y.o. very pleasant female patient with There is no height or weight on file to calculate BMI. who presents with the following:  Shawna Craig is a well-known patient and she presents today with some ongoing foot pain.  I know her entire family quite well.    Past Medical History, Surgical History, Social History, Family History, Problem List, Medications, and Allergies have been reviewed and updated if relevant.   GEN: No fevers, chills. Nontoxic. Primarily MSK c/o today. MSK: Detailed in the HPI GI: tolerating PO intake without difficulty Neuro: No numbness, parasthesias, or tingling associated. Otherwise the pertinent positives of the ROS are noted above.   Objective:   There were no vitals taken for this visit.  ***  Radiology: No results found.  Assessment and Plan:   ***

## 2019-06-13 ENCOUNTER — Ambulatory Visit: Payer: 59 | Admitting: Family Medicine

## 2019-06-21 ENCOUNTER — Telehealth: Payer: Self-pay

## 2019-06-21 NOTE — Telephone Encounter (Signed)
Pt c/o symptoms after receiving covid vaccine yesterday. Pt c/o arm hurting at injection site, very fatigued, and inability to focus "in a fog". Pt reports she was fine this morning but felt "weird, like I am cold but my ears are hot and like when I had my epidural and my blood pressure bottomed out. This lasted for a short time so she went to the school nurse and HR was 100 and pt was afebrile, BP was not checked. Pt started feeling better this afternoon but on her way home she became very fatigued and was worried something was wrong. Advised if she would like she could go to UR or she could be scheduled at the respiratory clinic since there are no available apt today. Pt declined either. Advised pt these are common symptoms with the covid vaccine and if any symptoms worsen or if she develops any new symptoms to go to UC or call the nurse line. Advised tylenol could be used as directed. Pt appreciative and verbalized understanding.

## 2019-06-21 NOTE — Telephone Encounter (Signed)
Thanks - please do and let me know how she is

## 2019-06-22 NOTE — Telephone Encounter (Signed)
LVM

## 2019-06-26 NOTE — Telephone Encounter (Signed)
LVM

## 2019-09-19 ENCOUNTER — Other Ambulatory Visit: Payer: Self-pay | Admitting: Obstetrics and Gynecology

## 2019-09-24 ENCOUNTER — Other Ambulatory Visit: Payer: Self-pay | Admitting: Obstetrics and Gynecology

## 2019-09-24 ENCOUNTER — Encounter: Payer: Self-pay | Admitting: Obstetrics and Gynecology

## 2019-09-24 MED ORDER — NORETHIN ACE-ETH ESTRAD-FE 1.5-30 MG-MCG PO TABS: 1.0000 | ORAL_TABLET | Freq: Every day | ORAL | 0 refills | Status: DC

## 2019-09-24 NOTE — Progress Notes (Signed)
Rx RF till 5/21 annual

## 2019-10-04 ENCOUNTER — Other Ambulatory Visit (HOSPITAL_COMMUNITY)
Admission: RE | Admit: 2019-10-04 | Discharge: 2019-10-04 | Disposition: A | Discharge status: Home / Self Care | Payer: 59 | Source: Ambulatory Visit | Attending: Obstetrics and Gynecology | Admitting: Obstetrics and Gynecology

## 2019-10-04 ENCOUNTER — Other Ambulatory Visit: Payer: Self-pay

## 2019-10-04 ENCOUNTER — Encounter: Payer: Self-pay | Admitting: Obstetrics and Gynecology

## 2019-10-04 ENCOUNTER — Ambulatory Visit (INDEPENDENT_AMBULATORY_CARE_PROVIDER_SITE_OTHER): Payer: 59 | Admitting: Obstetrics and Gynecology

## 2019-10-04 VITALS — BP 104/70 | Ht 62.0 in | Wt 204.0 lb

## 2019-10-04 DIAGNOSIS — Z1151 Encounter for screening for human papillomavirus (HPV): Secondary | ICD-10-CM

## 2019-10-04 DIAGNOSIS — Z3041 Encounter for surveillance of contraceptive pills: Secondary | ICD-10-CM

## 2019-10-04 DIAGNOSIS — Z124 Encounter for screening for malignant neoplasm of cervix: Secondary | ICD-10-CM

## 2019-10-04 DIAGNOSIS — R87810 Cervical high risk human papillomavirus (HPV) DNA test positive: Secondary | ICD-10-CM | POA: Insufficient documentation

## 2019-10-04 DIAGNOSIS — Z01419 Encounter for gynecological examination (general) (routine) without abnormal findings: Secondary | ICD-10-CM | POA: Diagnosis not present

## 2019-10-04 MED ORDER — NORETHIN ACE-ETH ESTRAD-FE 1.5-30 MG-MCG PO TABS: 1.0000 | ORAL_TABLET | Freq: Every day | ORAL | 3 refills | Status: DC

## 2019-10-04 NOTE — Patient Instructions (Signed)
I value your feedback and entrusting us with your care. If you get a [facility] patient survey, I would appreciate you taking the time to let us know about your experience today. Thank you!  As of April 26, 2019, your lab results will be released to your MyChart immediately, before I even have a chance to see them. Please give me time to review them and contact you if there are any abnormalities. Thank you for your patience.  

## 2019-10-04 NOTE — Progress Notes (Addendum)
PCP:  Abner Greenspan, MD   Chief Complaint  Patient presents with  . Gynecologic Exam     HPI:      [name] is a 34 y.o. [id number] whose LMP was Patient's last menstrual period was 09/06/2019 (approximate)., presents today for her annual examination.  Her menses are regular every 28-30 days, lasting 4 days.  Dysmenorrhea none. She does not have intermenstrual bleeding.  Sex activity: single partner, contraception - OCP (estrogen/progesterone).  Last Pap: 2020 neg per pt report; hx of HPV on pap since ~2019 (can't find copy of paps in chart); never had colpo/bx. Hx of STDs: ext genital warts, treated 9/20  Hx of yeast vag, last treated 12/20. Has terazol prn. 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  There is no FH of breast cancer. There is no FH of ovarian cancer. The patient does do self-breast exams.  Tobacco use: The patient denies current or previous tobacco use. Alcohol use: none No drug use.  Exercise: very active  She does not get adequate calcium and Vitamin D in her diet but has supp at home. Just needs to restart them.    Past Medical History:  Diagnosis Date  . Cervical high risk human papillomavirus (HPV) DNA test positive   . Genital warts   . Hidradenitis suppurativa   . Vaccine for human papilloma virus (HPV) types 6, 11, 16, and 18 administered   . Yeast vaginitis     Past Surgical History:  Procedure Laterality Date  . TONSILLECTOMY AND ADENOIDECTOMY     2007  . WISDOM TOOTH EXTRACTION      Family History  Problem Relation Age of Onset  . Breast cancer Neg Hx   . Ovarian cancer Neg Hx     Social History   Socioeconomic History  . Marital status: Widowed    Spouse name: Shanon Brow  . Number of children: 1  . Years of education: Not on file  . Highest education level: Not on file  Occupational History  . Occupation: exercise teacher     Employer: [employer]  Tobacco Use  . Smoking status: Never Smoker  . Smokeless tobacco: Never  Used  Substance and Sexual Activity  . Alcohol use: No    Alcohol/week: 0.0 standard drinks  . Drug use: No  . Sexual activity: Yes    Birth control/protection: Pill  Other Topics Concern  . 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 Difficulty of Paying Living Expenses:   Food Insecurity:   . Worried About Charity fundraiser in the Last Year:   . Arboriculturist in the Last Year:   Transportation Needs:   . Film/video editor (Medical):   Marland Kitchen Lack of Transportation (Non-Medical):   Physical Activity:   . Days of Exercise per Week:   . Minutes of Exercise per Session:   Stress:   . Feeling of Stress :   Social Connections:   . Frequency of Communication with Friends and Family:   . Frequency of Social Gatherings with Friends and Family:   . Attends Religious Services:   . Active Member of Clubs or Organizations:   .  Attends Banker Meetings:   Marland Kitchen Marital Status:   Intimate Partner Violence:   . Fear of Current or Ex-Partner:   . Emotionally Abused:   Marland Kitchen Physically Abused:   . Sexually Abused:      Current Outpatient Medications:  .  EPINEPHrine 0.3 mg/0.3 mL IJ SOAJ injection, Inject 0.3 mLs (0.3 mg total) into the muscle as needed for anaphylaxis., Disp: 1 Device, Rfl: 1 .  norethindrone-ethinyl estradiol-iron (LOESTRIN FE) 1.5-30 MG-MCG tablet, Take 1 tablet by mouth daily., Disp: 3 Package, Rfl: 3 .  terconazole (TERAZOL 7) 0.4 % vaginal cream, Place 1 applicator vaginally at bedtime., Disp: 45 g, Rfl: 0 .  ALPRAZolam (XANAX) 0.5 MG tablet, Take 1-2 tablets (0.5-1 mg total) by mouth at bedtime as needed for anxiety. With caution of sedation (Patient not taking: Reported on 10/04/2019), Disp: 60 tablet, Rfl: 1 .  sertraline  (ZOLOFT) 100 MG tablet, Take 1.5 tablets (150 mg total) by mouth daily. (Patient not taking: Reported on 10/04/2019), Disp: 45 tablet, Rfl: 11 .  sertraline (ZOLOFT) 50 MG tablet, , Disp: , Rfl:      ROS:  Review of Systems  Constitutional: Negative for fatigue, fever and unexpected weight change.  Respiratory: Negative for cough, shortness of breath and wheezing.   Cardiovascular: Negative for chest pain, palpitations and leg swelling.  Gastrointestinal: Negative for blood in stool, constipation, diarrhea, nausea and vomiting.  Endocrine: Negative for cold intolerance, heat intolerance and polyuria.  Genitourinary: Negative for dyspareunia, dysuria, flank pain, frequency, genital sores, hematuria, menstrual problem, pelvic pain, urgency, vaginal bleeding, vaginal discharge and vaginal pain.  Musculoskeletal: Negative for back pain, joint swelling and myalgias.  Skin: Negative for rash.  Neurological: Negative for dizziness, syncope, light-headedness, numbness and headaches.  Hematological: Negative for adenopathy.  Psychiatric/Behavioral: Negative for agitation, confusion, sleep disturbance and suicidal ideas. The patient is not nervous/anxious.   BREAST: No symptoms   Objective: BP 104/70   Ht 5\' 2"  (1.575 m)   Wt 204 lb (92.5 kg)   LMP 09/06/2019 (Approximate)   BMI 37.31 kg/m    Physical Exam Constitutional:      Appearance: She is well-developed.  Genitourinary:     Vulva, vagina, cervix, uterus, right adnexa and left adnexa normal.     No vulval lesion or tenderness noted.     No vaginal discharge, erythema or tenderness.     No cervical polyp.     Uterus is not enlarged or tender.     No right or left adnexal mass present.     Right adnexa not tender.     Left adnexa not tender.  Neck:     Thyroid: No thyromegaly.  Cardiovascular:     Rate and Rhythm: Normal rate and regular rhythm.     Heart sounds: Normal heart sounds. No murmur.  Pulmonary:     Effort:  Pulmonary effort is normal.     Breath sounds: Normal breath sounds.  Chest:     Breasts:        Right: No mass, nipple discharge, skin change or tenderness.        Left: No mass, nipple discharge, skin change or tenderness.  Abdominal:     Palpations: Abdomen is soft.     Tenderness: There is no abdominal tenderness. There is no guarding.  Musculoskeletal:        General: Normal range of motion.     Cervical back: Normal range of motion.  Neurological:     General:  No focal deficit present.     Mental Status: She is alert and oriented to person, place, and time.     Cranial Nerves: No cranial nerve deficit.  Skin:    General: Skin is warm and dry.  Psychiatric:        Mood and Affect: Mood normal.        Behavior: Behavior normal.        Thought Content: Thought content normal.        Judgment: Judgment normal.  Vitals reviewed.     Assessment/Plan: Encounter for annual routine gynecological examination  Cervical cancer screening - Plan: Cytology - PAP  Screening for HPV (human papillomavirus) - Plan: Cytology - PAP  Cervical high risk human papillomavirus (HPV) DNA test positive - Plan: Cytology - PAP; repeat pap today.  Encounter for surveillance of contraceptive pills - Plan: norethindrone-ethinyl estradiol-iron (LOESTRIN FE) 1.5-30 MG-MCG tablet; OCP RF  Meds ordered this encounter  Medications  . norethindrone-ethinyl estradiol-iron (LOESTRIN FE) 1.5-30 MG-MCG tablet    Sig: Take 1 tablet by mouth daily.    Dispense:  3 Package    Refill:  3    Order Specific Question:   Supervising Provider    Answer:   Nadara Mustard [[id number]]             GYN counsel adequate intake of calcium and vitamin D, diet and exercise     F/U  Return in about 1 year (around 10/03/2020).  Tuana Hoheisel B. Josetta Wigal, PA-C 10/04/2019 3:37 PM

## 2019-10-08 LAB — CYTOLOGY - PAP
Comment: NEGATIVE
Diagnosis: NEGATIVE
High risk HPV: POSITIVE — AB

## 2019-10-18 ENCOUNTER — Other Ambulatory Visit: Payer: Self-pay

## 2019-10-18 ENCOUNTER — Encounter: Payer: Self-pay | Admitting: Obstetrics & Gynecology

## 2019-10-18 ENCOUNTER — Ambulatory Visit (INDEPENDENT_AMBULATORY_CARE_PROVIDER_SITE_OTHER): Payer: 59 | Admitting: Obstetrics & Gynecology

## 2019-10-18 ENCOUNTER — Other Ambulatory Visit (HOSPITAL_COMMUNITY)
Admission: RE | Admit: 2019-10-18 | Discharge: 2019-10-18 | Disposition: A | Discharge status: Home / Self Care | Payer: 59 | Source: Ambulatory Visit | Attending: Obstetrics & Gynecology | Admitting: Obstetrics & Gynecology

## 2019-10-18 VITALS — BP 120/80 | Ht 63.0 in | Wt 203.0 lb

## 2019-10-18 DIAGNOSIS — R87810 Cervical high risk human papillomavirus (HPV) DNA test positive: Secondary | ICD-10-CM

## 2019-10-18 NOTE — Progress Notes (Signed)
Referring Provider:  ABC  HPI:  Shawna Craig is a 34 y.o.  [id number]  who presents today for evaluation and management of abnormal cervical cytology.    Dysplasia History:  High Risk HPV by PAP 2021 and 2019  ROS:  Pertinent items are noted in HPI.  OB History  Gravida Para Term Preterm AB Living  2 2 2  0 0 2  SAB TAB Ectopic Multiple Live Births  0 0 0 0 2    # Outcome Date GA Lbr Len/2nd Weight Sex Delivery Anes PTL Lv  2 Term 10/08/15 [redacted]w[redacted]d 03:31 / 00:09 7 lb 1.8 oz (3.225 kg) M Vag-Spont EPI  LIV     Birth Comments: Normal newborn screen 10/2015  1 Term 10/09/12 [redacted]w[redacted]d 16:49 / 00:50 6 lb 15.3 oz (3.155 kg) F Vag-Spont EPI  LIV     Birth Comments: na    Past Medical History:  Diagnosis Date  . Cervical high risk human papillomavirus (HPV) DNA test positive   . Genital warts   . Hidradenitis suppurativa   . Vaccine for human papilloma virus (HPV) types 6, 11, 16, and 18 administered   . Yeast vaginitis     Past Surgical History:  Procedure Laterality Date  . TONSILLECTOMY AND ADENOIDECTOMY     2007  . WISDOM TOOTH EXTRACTION      SOCIAL HISTORY: Social History   Substance and Sexual Activity  Alcohol Use No  . Alcohol/week: 0.0 standard drinks   Social History   Substance and Sexual Activity  Drug Use No     Family History  Problem Relation Age of Onset  . Breast cancer Neg Hx   . Ovarian cancer Neg Hx     ALLERGIES:  Patient has no known allergies.  Current Outpatient Medications on File Prior to Visit  Medication Sig Dispense Refill  . ALPRAZolam (XANAX) 0.5 MG tablet Take 1-2 tablets (0.5-1 mg total) by mouth at bedtime as needed for anxiety. With caution of sedation 60 tablet 1  . EPINEPHrine 0.3 mg/0.3 mL IJ SOAJ injection Inject 0.3 mLs (0.3 mg total) into the muscle as needed for anaphylaxis. 1 Device 1  . norethindrone-ethinyl estradiol-iron (LOESTRIN FE) 1.5-30 MG-MCG tablet Take 1 tablet by mouth daily. 3 Package 3  . sertraline (ZOLOFT)  100 MG tablet Take 1.5 tablets (150 mg total) by mouth daily. 45 tablet 11  . sertraline (ZOLOFT) 50 MG tablet     . terconazole (TERAZOL 7) 0.4 % vaginal cream Place 1 applicator vaginally at bedtime. 45 g 0   No current facility-administered medications on file prior to visit.    Physical Exam: -Vitals:  BP 120/80   Ht 5\' 3"  (1.6 m)   Wt 203 lb (92.1 kg)   BMI 35.96 kg/m  GEN: WD, WN, NAD.  A+ O x 3, good mood and affect. ABD:  NT, ND.  Soft, no masses.  No hernias noted.   Pelvic:   Vulva: Normal appearance.  No lesions.  Vagina: No lesions or abnormalities noted.  Support: Normal pelvic support.  Urethra No masses tenderness or scarring.  Meatus Normal size without lesions or prolapse.  Cervix: See below.  Anus: Normal exam.  No lesions.  Perineum: Normal exam.  No lesions.        Bimanual   Uterus: Normal size.  Non-tender.  Mobile.  AV.  Adnexae: No masses.  Non-tender to palpation.  Cul-de-sac: Negative for abnormality.  Physical Exam Genitourinary:  PROCEDURE: 1.  Urine Pregnancy Test:  not done 2.  Colposcopy performed with 4% acetic acid after verbal consent obtained                                         -Aceto-white Lesions Location(s): irreg TZ w ectropion.              -Biopsy performed at 6, 12 o'clock               -ECC indicated and performed: Yes.       -Biopsy sites made hemostatic with pressure, AgNO3, and/or Monsel's solution   -Satisfactory colposcopy: Yes.      -Evidence of Invasive cervical CA :  NO  ASSESSMENT:  Shawna Craig is a 34 y.o. [id number] here for  1. Cervical high risk HPV (human papillomavirus) test positive   .  PLAN: 1.  I discussed the grading system of pap smears and HPV high risk viral types.  We will discuss and base management after colpo results return. 2. Follow up PAP 6 months, vs intervention if high grade dysplasia identified 3. Treatment of persistantly abnormal PAP smears and cervical dysplasia, even  mild, is discussed w pt today in detail, as well as the pros and cons of Cryo and LEEP procedures. Will consider and discuss after results.      Barnett Applebaum, MD, [facility], [facility] 10/18/2019  2:59 PM

## 2019-10-18 NOTE — Patient Instructions (Signed)

## 2019-10-24 LAB — SURGICAL PATHOLOGY

## 2019-11-28 DIAGNOSIS — J019 Acute sinusitis, unspecified: Secondary | ICD-10-CM | POA: Diagnosis not present

## 2019-11-28 DIAGNOSIS — J029 Acute pharyngitis, unspecified: Secondary | ICD-10-CM | POA: Diagnosis not present

## 2019-11-28 DIAGNOSIS — Z03818 Encounter for observation for suspected exposure to other biological agents ruled out: Secondary | ICD-10-CM | POA: Diagnosis not present

## 2019-11-28 DIAGNOSIS — R6889 Other general symptoms and signs: Secondary | ICD-10-CM | POA: Diagnosis not present

## 2019-11-28 DIAGNOSIS — B373 Candidiasis of vulva and vagina: Secondary | ICD-10-CM | POA: Diagnosis not present

## 2019-12-25 ENCOUNTER — Encounter: Payer: Self-pay | Admitting: Family Medicine

## 2019-12-25 MED ORDER — PREDNISONE 10 MG PO TABS: ORAL_TABLET | ORAL | 0 refills | Status: DC

## 2019-12-25 MED ORDER — FLUTICASONE PROPIONATE 50 MCG/ACT NA SUSP
2.0000 | Freq: Every day | NASAL | 6 refills | Status: DC
Start: 2019-12-25 — End: 2021-12-02

## 2020-03-08 ENCOUNTER — Ambulatory Visit (INDEPENDENT_AMBULATORY_CARE_PROVIDER_SITE_OTHER): Payer: Self-pay

## 2020-03-08 DIAGNOSIS — Z23 Encounter for immunization: Secondary | ICD-10-CM

## 2020-03-10 ENCOUNTER — Other Ambulatory Visit: Payer: Self-pay

## 2020-03-10 DIAGNOSIS — Z23 Encounter for immunization: Secondary | ICD-10-CM

## 2020-04-13 ENCOUNTER — Encounter: Payer: Self-pay | Admitting: Obstetrics and Gynecology

## 2020-04-14 ENCOUNTER — Other Ambulatory Visit: Payer: Self-pay | Admitting: Obstetrics and Gynecology

## 2020-04-14 DIAGNOSIS — B3731 Acute candidiasis of vulva and vagina: Secondary | ICD-10-CM

## 2020-04-14 MED ORDER — TERCONAZOLE 0.4 % VA CREA: 1.0000 | TOPICAL_CREAM | Freq: Every day | VAGINAL | 0 refills | Status: DC

## 2020-04-14 MED ORDER — FLUCONAZOLE 150 MG PO TABS: 150.0000 mg | ORAL_TABLET | Freq: Once | ORAL | 0 refills | Status: DC

## 2020-04-14 NOTE — Progress Notes (Signed)
Rx RF yeast vag meds.

## 2020-05-05 ENCOUNTER — Encounter: Payer: Self-pay | Admitting: Obstetrics and Gynecology

## 2020-05-05 ENCOUNTER — Ambulatory Visit (INDEPENDENT_AMBULATORY_CARE_PROVIDER_SITE_OTHER): Payer: BC Managed Care – PPO | Admitting: Obstetrics and Gynecology

## 2020-05-05 ENCOUNTER — Other Ambulatory Visit: Payer: Self-pay

## 2020-05-05 ENCOUNTER — Other Ambulatory Visit (HOSPITAL_COMMUNITY)
Admission: RE | Admit: 2020-05-05 | Discharge: 2020-05-05 | Disposition: A | Discharge status: Home / Self Care | Payer: BC Managed Care – PPO | Source: Ambulatory Visit | Attending: Obstetrics and Gynecology | Admitting: Obstetrics and Gynecology

## 2020-05-05 ENCOUNTER — Ambulatory Visit: Payer: 59 | Admitting: Obstetrics and Gynecology

## 2020-05-05 VITALS — BP 110/70 | Ht 63.0 in | Wt 207.0 lb

## 2020-05-05 DIAGNOSIS — N870 Mild cervical dysplasia: Secondary | ICD-10-CM | POA: Diagnosis not present

## 2020-05-05 DIAGNOSIS — Z124 Encounter for screening for malignant neoplasm of cervix: Secondary | ICD-10-CM | POA: Diagnosis not present

## 2020-05-05 DIAGNOSIS — Z1151 Encounter for screening for human papillomavirus (HPV): Secondary | ICD-10-CM

## 2020-05-05 NOTE — Patient Instructions (Signed)
I value your feedback and entrusting us with your care. If you get a [facility] patient survey, I would appreciate you taking the time to let us know about your experience today. Thank you!  As of April 26, 2019, your lab results will be released to your MyChart immediately, before I even have a chance to see them. Please give me time to review them and contact you if there are any abnormalities. Thank you for your patience.  

## 2020-05-05 NOTE — Progress Notes (Signed)
Tower, Audrie Gallus, MD   Chief Complaint  Patient presents with  . Follow-up    pap    HPI:      [name] is a 34 y.o. [id number] whose LMP was Patient's last menstrual period was 04/28/2020 (approximate)., presents today for 6 mo repeat pap smear. Hx of pos HPV DNA on paps since 2019. 5/21 neg pap/pos HPV DNA; had colpo/bx 6/21 with Dr. Tiburcio Pea with CIN 1, repeat pap due today.  Hx of recurrent yeast vag sx. 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 Treated with diflucan recently with sx relief.   Past Medical History:  Diagnosis Date  . Cervical high risk human papillomavirus (HPV) DNA test positive   . Genital warts   . Hidradenitis suppurativa   . Vaccine for human papilloma virus (HPV) types 6, 11, 16, and 18 administered   . Yeast vaginitis     Past Surgical History:  Procedure Laterality Date  . TONSILLECTOMY AND ADENOIDECTOMY     2007  . WISDOM TOOTH EXTRACTION      Family History  Problem Relation Age of Onset  . Breast cancer Neg Hx   . Ovarian cancer Neg Hx     Social History   Socioeconomic History  . Marital status: Widowed    Spouse name: Onalee Hua  . Number of children: 1  . Years of education: Not on file  . Highest education level: Not on file  Occupational History  . Occupation: exercise teacher     Employer: [employer]  Tobacco Use  . Smoking status: Never Smoker  . Smokeless tobacco: Never Used  Vaping Use  . Vaping Use: Never used  Substance and Sexual Activity  . Alcohol use: No    Alcohol/week: 0.0 standard drinks  . Drug use: No  . Sexual activity: Yes    Birth control/protection: Pill  Other Topics Concern  . 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 EPINEPHrine 0.3 mg/0.3 mL IJ SOAJ injection Inject 0.3 mLs (0.3 mg total) into the muscle as needed for anaphylaxis. 1 Device 1  . fluticasone (FLONASE) 50 MCG/ACT nasal spray Place 2 sprays into both nostrils daily. 16 g 6  . norethindrone-ethinyl estradiol-iron (LOESTRIN FE) 1.5-30 MG-MCG tablet Take 1 tablet by mouth daily. 3 Package 3  . terconazole (TERAZOL 7) 0.4 % vaginal cream Place 1 applicator vaginally at bedtime. 45 g 0  . ALPRAZolam (XANAX) 0.5 MG tablet Take 1-2 tablets (0.5-1 mg total) by mouth at bedtime as needed for anxiety. With caution of sedation 60 tablet 1  . predniSONE (DELTASONE) 10 MG tablet Take 3 pills once daily by mouth for 3 days, then 2 pills once daily for 3 days, then 1 pill once daily for 3 days and then stop 18 tablet 0  . sertraline (ZOLOFT) 100 MG tablet Take 1.5  tablets (150 mg total) by mouth daily. 45 tablet 11  . sertraline (ZOLOFT) 50 MG tablet      No facility-administered medications prior to visit.      ROS:  Review of Systems  Constitutional: Negative for fever, malaise/fatigue and weight loss.  Gastrointestinal: Negative for blood in stool, constipation, diarrhea, nausea and vomiting.  Genitourinary: Negative for dyspareunia, dysuria, flank pain, frequency, hematuria, urgency, vaginal bleeding, vaginal discharge and vaginal pain.  Musculoskeletal: Negative for back pain.  Skin: Negative for itching and rash.    OBJECTIVE:   Vitals:  BP 110/70   Ht 5\' 3"  (1.6 m)   Wt 207 lb (93.9 kg)   LMP 04/28/2020 (Approximate)   BMI 36.67 kg/m   Physical Exam Vitals reviewed.  Constitutional:      Appearance: She is well-developed.  Pulmonary:     Effort: Pulmonary effort  is normal.  Genitourinary:    General: Normal vulva.     Pubic Area: No rash.      Labia:        Right: No rash, tenderness or lesion.        Left: No rash, tenderness or lesion.      Vagina: Normal. No vaginal discharge, erythema or tenderness.     Cervix: Normal.     Uterus: Normal. Not enlarged and not tender.      Adnexa: Right adnexa normal and left adnexa normal.       Right: No mass or tenderness.         Left: No mass or tenderness.    Musculoskeletal:        General: Normal range of motion.     Cervical back: Normal range of motion.  Skin:    General: Skin is warm and dry.  Neurological:     General: No focal deficit present.     Mental Status: She is alert and oriented to person, place, and time.  Psychiatric:        Mood and Affect: Mood normal.        Behavior: Behavior normal.        Thought Content: Thought content normal.        Judgment: Judgment normal.     Assessment/Plan: Cervical cancer screening - Plan: Cytology - PAP  Screening for HPV (human papillomavirus) - Plan: Cytology - PAP  Dysplasia of cervix, low grade (CIN 1) - Plan: Cytology - PAP  Repeat pap today. Will f/u with results. RTO 5/22 for annual/pap.      Return in about 5 months (around 10/03/2020).  Genette Huertas B. Simon Llamas, PA-C 05/05/2020 3:00 PM

## 2020-05-06 DIAGNOSIS — R87610 Atypical squamous cells of undetermined significance on cytologic smear of cervix (ASC-US): Secondary | ICD-10-CM | POA: Diagnosis not present

## 2020-05-08 ENCOUNTER — Encounter: Payer: Self-pay | Admitting: Obstetrics and Gynecology

## 2020-05-08 LAB — CYTOLOGY - PAP
Comment: NEGATIVE
Diagnosis: UNDETERMINED — AB
High risk HPV: NEGATIVE

## 2020-06-17 ENCOUNTER — Encounter: Payer: Self-pay | Admitting: Family Medicine

## 2020-06-17 MED ORDER — AMOXICILLIN-POT CLAVULANATE 875-125 MG PO TABS: 1.0000 | ORAL_TABLET | Freq: Two times a day (BID) | ORAL | 0 refills | Status: DC

## 2020-06-20 ENCOUNTER — Encounter: Payer: Self-pay | Admitting: Family Medicine

## 2020-06-20 ENCOUNTER — Encounter: Payer: Self-pay | Admitting: Obstetrics and Gynecology

## 2020-06-20 MED ORDER — FLUCONAZOLE 150 MG PO TABS
150.0000 mg | ORAL_TABLET | Freq: Once | ORAL | 0 refills | Status: DC
Start: 2020-06-20 — End: 2020-06-24

## 2020-06-23 MED ORDER — SERTRALINE HCL 50 MG PO TABS: 50.0000 mg | ORAL_TABLET | Freq: Every day | ORAL | 1 refills | Status: DC

## 2020-06-23 NOTE — Addendum Note (Signed)
Addended by: Roxy Manns A on: 06/23/2020 08:46 PM   Modules accepted: Orders

## 2020-06-24 MED ORDER — FLUCONAZOLE 150 MG PO TABS: 150.0000 mg | ORAL_TABLET | Freq: Once | ORAL | 1 refills | Status: AC

## 2020-06-24 NOTE — Addendum Note (Signed)
Addended by: Roxy Manns A on: 06/24/2020 08:33 PM   Modules accepted: Orders

## 2020-07-01 NOTE — Telephone Encounter (Signed)
Patient received diflucan from her PCP.

## 2020-07-02 ENCOUNTER — Ambulatory Visit: Payer: BC Managed Care – PPO | Admitting: Family Medicine

## 2020-07-02 DIAGNOSIS — T7840XA Allergy, unspecified, initial encounter: Secondary | ICD-10-CM | POA: Diagnosis not present

## 2020-07-02 DIAGNOSIS — L509 Urticaria, unspecified: Secondary | ICD-10-CM | POA: Diagnosis not present

## 2020-07-03 ENCOUNTER — Ambulatory Visit: Payer: BC Managed Care – PPO | Admitting: Family Medicine

## 2020-07-03 ENCOUNTER — Telehealth: Payer: Self-pay

## 2020-07-03 NOTE — Telephone Encounter (Signed)
Spoke with pt she did end up going to an UC yesterday. They told her she was having an allergic reaction to something but they couldn't tell her what, but they did prescribe her prednisone. Pt is still having sxs but the triage nurse told her it may take a while for the prednisone to "kick in" and she should wait for at least 24 hrs to let it get in her sxs. I offered pt an appt tomorrow with PCP to re-eval sxs but pt is going out of town tomorrow morning. That's is pt's biggest question is does PCP think it's okay for her to go out of town given sxs since she has prednisone and she has an epi-pen. I asked how pt felt. 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 She can't see it but she's sure something bit her. Pt said in the past she's had a reaction to a bug bite but just like this time she never found out what type of bug bit her. I asked pt how she feels overall. Pt said she feels fine and she can tell the prednisone is starting to get her her sxs. Pt said her main issue is that randomly her hands, feet, and ears will start to turn red, itchy, and even burn. 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 Pt said the redness, burning and itching will happen occ and then wear off and she will go back to feeling fine. Pt said she did take a benadryl last night to help but since it makes her so drossy she can't take it while working. So again pt's main questions is, is she okay to travel and also is she on the right course does she just need to give the benadryl more time

## 2020-07-03 NOTE — Telephone Encounter (Signed)
[facility] - Client TELEPHONE ADVICE RECORD AccessNurse Patient Name: Shawna Craig Gender: Female DOB: 05/03/1986 Age: 35 Y 3 M 2 D Return Phone Number: [phone] (Primary) Address: City/State/Zip: [place] [address] Client [facility] - Client Client Site [facility] - Day Physician [name], Idamae Schuller - MD Contact Type Call Who Is Calling Patient / Member / Family / Caregiver Call Type Triage / Clinical Relationship To Patient Self Return Phone Number [phone] (Primary) Chief Complaint Rash - Widespread Reason for Call Symptomatic / Request for Health Information Initial Comment Caller states she is having an allergic reaction, but does not know where it's from. Her symptoms include very itchy, redness all over. She was given Prednisone yesterday and just took her first dose. She is unsure of what is making her breakout. Translation No Nurse Assessment Nurse: Stefano Gaul, RN, Vera Date/Time (Eastern Time): 07/03/2020 12:35:14 PM Confirm and document reason for call. If symptomatic, describe symptoms. ---Caller states she is having an allergic reaction to something which started on Tuesday. Had a little swelling in her lips and face. has redness and itching all over primarily in her hands and feet. Took her first dose of prednisone today. Went to walk in clinic yesterday. Does the patient have any new or worsening symptoms? ---Yes Will a triage be completed? ---Yes Related visit to physician within the last 2 weeks? ---Yes Does the PT have any chronic conditions? (i.e. diabetes, asthma, this includes High risk factors for pregnancy, etc.) ---No Is the patient pregnant or possibly pregnant? (Ask all females between the ages of 73-55) ---No Is this a behavioral health or substance abuse call? ---No Guidelines Guideline Title Affirmed Question Affirmed Notes Nurse Date/Time Lamount Cohen Time) Hives  Widespread hives Stefano Gaul, Metallurgist 07/03/2020 12:38:25 PM Disp. Time Lamount Cohen Time) Disposition Final User 07/03/2020 12:17:59 PM Attempt made - message left Stefano Gaul, RN, Dwana Curd 07/03/2020 12:42:27 PM Home Care Yes Stefano Gaul, RN, Dwana Curd PLEASE NOTE: All timestamps contained within this report are represented as [place] Standard Time. CONFIDENTIALTY NOTICE: This fax transmission is intended only for the addressee. It contains information that is legally privileged, confidential or otherwise protected from use or disclosure. If you are not the intended recipient, you are strictly prohibited from reviewing, disclosing, copying using or disseminating any of this information or taking any action in reliance on or regarding this information. If you have received this fax in error, please notify us immediately by telephone so that we can arrange for its return to [place]. Phone: [phone], Toll-Free: [phone], Fax: [phone] Page: 2 of 2 Call Id: [id number] Caller Disagree/Comply Comply Caller Understands Yes PreDisposition Did not know what to do Care Advice Given Per Guideline HOME CARE: * You should be able to treat this at home. REASSURANCE AND EDUCATION: * It sounds like mild hives. ANTIHISTAMINE MEDICINES FOR WIDESPREAD HIVES: * CETIRIZINE (REACTINE, ZYRTEC): The adult dose is 10 mg and you take it once a day. Cetirizine is available in the [place] as Zyrtec and in [place] as Reactine. * LORATADINE (ALAVERT, CLARITIN): The adult dose is 10 mg and you take it once a day. Loratadine is available in the [place] as Programmer, systems; it is available in [place] as Claritin. CALL BACK IF: * Severe hives or severe itching persist over 24 hours despite taking an antihistamine (e.g., Benadryl) * You become worse CARE ADVICE given per Hives (Adult) guideline.

## 2020-07-03 NOTE — Telephone Encounter (Signed)
Called and LVM for patient to return call. 

## 2020-07-03 NOTE — Telephone Encounter (Signed)
Pt notified of Dr. Royden Purl comments and recommendations and verbalized understanding, she will add zyrtec daily and see how she feels in the morning. Pt also has epi-pen she will keep incase of any severe reactions

## 2020-07-03 NOTE — Telephone Encounter (Signed)
Continue the prednisone-it may take a little more time.  Most importantly watch for mouth or throat swelling or trouble breathing -if so go to the ER.  If not already on it- I would add a less sedating antihistamine like zyrtec 10 mg daily  She will need to see how she feels tomorrow before traveling but hopefully will gradually improve

## 2020-07-03 NOTE — Telephone Encounter (Signed)
Called and left voicemail for patient to return call to office.  °

## 2020-07-03 NOTE — Telephone Encounter (Signed)
Patient returned your call. Please call her back. EM 

## 2020-07-22 ENCOUNTER — Encounter: Payer: Self-pay | Admitting: Obstetrics and Gynecology

## 2020-07-23 ENCOUNTER — Other Ambulatory Visit: Payer: Self-pay | Admitting: Obstetrics and Gynecology

## 2020-07-23 MED ORDER — FLUCONAZOLE 150 MG PO TABS: 150.0000 mg | ORAL_TABLET | Freq: Once | ORAL | 0 refills | Status: AC

## 2020-07-23 NOTE — Progress Notes (Signed)
Rx RF diflucan for yeast vag sx with menses.

## 2020-10-12 ENCOUNTER — Other Ambulatory Visit: Payer: Self-pay | Admitting: Obstetrics and Gynecology

## 2020-10-12 DIAGNOSIS — Z3041 Encounter for surveillance of contraceptive pills: Secondary | ICD-10-CM

## 2020-10-15 ENCOUNTER — Other Ambulatory Visit: Payer: Self-pay

## 2020-10-15 DIAGNOSIS — Z3041 Encounter for surveillance of contraceptive pills: Secondary | ICD-10-CM

## 2020-10-15 MED ORDER — NORETHIN ACE-ETH ESTRAD-FE 1.5-30 MG-MCG PO TABS: 1.0000 | ORAL_TABLET | Freq: Every day | ORAL | 0 refills | Status: DC

## 2020-10-15 NOTE — Telephone Encounter (Signed)
Pt calling; sched annual for 10/21/20; needs refill of bc 'til then.  [phone]  Pt aware refill eRx'd.

## 2020-10-20 DIAGNOSIS — F4321 Adjustment disorder with depressed mood: Secondary | ICD-10-CM | POA: Diagnosis not present

## 2020-10-21 ENCOUNTER — Ambulatory Visit (INDEPENDENT_AMBULATORY_CARE_PROVIDER_SITE_OTHER): Payer: BC Managed Care – PPO | Admitting: Obstetrics and Gynecology

## 2020-10-21 ENCOUNTER — Other Ambulatory Visit (HOSPITAL_COMMUNITY)
Admission: RE | Admit: 2020-10-21 | Discharge: 2020-10-21 | Disposition: A | Discharge status: Home / Self Care | Payer: BC Managed Care – PPO | Source: Ambulatory Visit | Attending: Obstetrics and Gynecology | Admitting: Obstetrics and Gynecology

## 2020-10-21 ENCOUNTER — Encounter: Payer: Self-pay | Admitting: Obstetrics and Gynecology

## 2020-10-21 ENCOUNTER — Other Ambulatory Visit: Payer: Self-pay

## 2020-10-21 VITALS — BP 110/70 | Ht 63.0 in | Wt 199.0 lb

## 2020-10-21 DIAGNOSIS — Z3041 Encounter for surveillance of contraceptive pills: Secondary | ICD-10-CM

## 2020-10-21 DIAGNOSIS — Z01419 Encounter for gynecological examination (general) (routine) without abnormal findings: Secondary | ICD-10-CM | POA: Diagnosis not present

## 2020-10-21 DIAGNOSIS — B373 Candidiasis of vulva and vagina: Secondary | ICD-10-CM

## 2020-10-21 DIAGNOSIS — B3731 Acute candidiasis of vulva and vagina: Secondary | ICD-10-CM

## 2020-10-21 DIAGNOSIS — N870 Mild cervical dysplasia: Secondary | ICD-10-CM

## 2020-10-21 DIAGNOSIS — Z1151 Encounter for screening for human papillomavirus (HPV): Secondary | ICD-10-CM | POA: Diagnosis not present

## 2020-10-21 DIAGNOSIS — Z124 Encounter for screening for malignant neoplasm of cervix: Secondary | ICD-10-CM

## 2020-10-21 MED ORDER — NORETHIN ACE-ETH ESTRAD-FE 1.5-30 MG-MCG PO TABS
1.0000 | ORAL_TABLET | Freq: Every day | ORAL | 3 refills | Status: DC
Start: 2020-10-21 — End: 2021-10-19

## 2020-10-21 NOTE — Progress Notes (Signed)
PCP:  Judy Pimple, MD   Chief Complaint  Patient presents with  . Gynecologic Exam     HPI:      [name] is a 35 y.o. [id number] whose LMP was Patient's last menstrual period was 10/07/2020 (approximate)., presents today for her annual examination.  Her menses are regular every 28-30 days, lasting 3-4 days.  Dysmenorrhea mild, worse than in the past but still no meds needed. She does not have intermenstrual bleeding.  Sex activity: single partner, contraception - OCP (estrogen/progesterone).  Last Pap: 05/05/20 (6 mo repeat pap after colpo/bx 6/21)  Results were ASCUS/neg HPV DNA. 2020 neg per pt report; hx of HPV on pap 2019 (can't find copy of paps in chart) and 2021; CIN1 on colpo/bx with Dr. Tiburcio Pea 6/21; repeat pap due todayHx of STDs: ext genital warts, treated 9/20  Hx of recurrent yeast vag, sx much improved with addition of probiotics. 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  There is no FH of breast cancer. There is no FH of ovarian cancer. The patient does do self-breast exams.  Tobacco use: The patient denies current or previous tobacco use. Alcohol use: none No drug use.  Exercise: very active  She does get adequate calcium but not Vitamin D in her diet.  Past Medical History:  Diagnosis Date  . Cervical high risk human papillomavirus (HPV) DNA test positive   . Genital warts   . Hidradenitis suppurativa   . Vaccine for human papilloma virus (HPV) types 6, 11, 16, and 18 administered   . Yeast vaginitis     Past Surgical History:  Procedure Laterality Date  . TONSILLECTOMY AND ADENOIDECTOMY     2007  . WISDOM TOOTH EXTRACTION      Family History  Problem Relation Age of Onset  . Breast cancer Neg Hx   . Ovarian cancer Neg Hx     Social History   Socioeconomic History  . Marital status: Widowed    Spouse name: Onalee Hua  . Number of children: 1  . Years of education: Not on file  . Highest education level: Not on file  Occupational  History  . Occupation: exercise teacher     Employer: [employer]  Tobacco Use  . Smoking status: Never Smoker  . Smokeless tobacco: Never Used  Vaping Use  . Vaping Use: Never used  Substance and Sexual Activity  . Alcohol use: No    Alcohol/week: 0.0 standard drinks  . Drug use: No  . Sexual activity: Yes    Birth control/protection: Pill  Other Topics Concern  . Not on file  Social History Narrative   Pt is married (Dr Karle Starch daughter in law) , and has a 93 mo old daughter   Stays home and also teaches exercise classes at the [employer] on finishing a degree on line    Social Determinants of Health   Financial Resource Strain: Not on file  Food Insecurity: Not on file  Transportation Needs: Not on file  Physical Activity: Not on file  Stress: Not on file  Social Connections: Not on file  Intimate Partner Violence: Not on file     Current Outpatient Medications:  .  EPINEPHrine 0.3 mg/0.3 mL IJ SOAJ injection, Inject 0.3 mLs (0.3 mg total) into the muscle as needed for anaphylaxis., Disp: 1 Device, Rfl: 1 .  fluticasone (FLONASE) 50 MCG/ACT nasal spray, Place 2 sprays into both nostrils daily., Disp: 16 g, Rfl: 6 .  sertraline (ZOLOFT) 50 MG tablet, Take 1 tablet (50 mg total) by mouth daily., Disp: 90 tablet, Rfl: 1 .  terconazole (TERAZOL 7) 0.4 % vaginal cream, Place 1 applicator vaginally at bedtime., Disp: 45 g, Rfl: 0 .  norethindrone-ethinyl estradiol-iron (LOESTRIN FE) 1.5-30 MG-MCG tablet, Take 1 tablet by mouth daily., Disp: 84 tablet, Rfl: 3     ROS:  Review of Systems  Constitutional: Negative for fatigue, fever and unexpected weight change.  Respiratory: Negative for cough, shortness of breath and wheezing.   Cardiovascular: Negative for chest pain, palpitations and leg swelling.  Gastrointestinal: Negative for blood in stool, constipation, diarrhea, nausea and vomiting.  Endocrine: Negative for cold intolerance, heat intolerance and polyuria.   Genitourinary: Negative for dyspareunia, dysuria, flank pain, frequency, genital sores, hematuria, menstrual problem, pelvic pain, urgency, vaginal bleeding, vaginal discharge and vaginal pain.  Musculoskeletal: Negative for back pain, joint swelling and myalgias.  Skin: Negative for rash.  Neurological: Negative for dizziness, syncope, light-headedness, numbness and headaches.  Hematological: Negative for adenopathy.  Psychiatric/Behavioral: Positive for agitation and dysphoric mood. Negative for confusion, sleep disturbance and suicidal ideas. The patient is not nervous/anxious.   BREAST: No symptoms   Objective: BP 110/70   Ht 5\' 3"  (1.6 m)   Wt 199 lb (90.3 kg)   LMP 10/07/2020 (Approximate)   BMI 35.25 kg/m    Physical Exam Constitutional:      Appearance: She is well-developed.  Genitourinary:     Vulva normal.     Right Labia: No rash, tenderness or lesions.    Left Labia: No tenderness, lesions or rash.    No vaginal discharge, erythema or tenderness.      Right Adnexa: not tender and no mass present.    Left Adnexa: not tender and no mass present.    No cervical friability or polyp.     Uterus is not enlarged or tender.  Breasts:     Right: No mass, nipple discharge, skin change or tenderness.     Left: No mass, nipple discharge, skin change or tenderness.    Neck:     Thyroid: No thyromegaly.  Cardiovascular:     Rate and Rhythm: Normal rate and regular rhythm.     Heart sounds: Normal heart sounds. No murmur heard.   Pulmonary:     Effort: Pulmonary effort is normal.     Breath sounds: Normal breath sounds.  Abdominal:     Palpations: Abdomen is soft.     Tenderness: There is no abdominal tenderness. There is no guarding or rebound.  Musculoskeletal:        General: Normal range of motion.     Cervical back: Normal range of motion.  Lymphadenopathy:     Cervical: No cervical adenopathy.  Neurological:     General: No focal deficit present.      Mental Status: She is alert and oriented to person, place, and time.     Cranial Nerves: No cranial nerve deficit.  Skin:    General: Skin is warm and dry.  Psychiatric:        Mood and Affect: Mood normal.        Behavior: Behavior normal.        Thought Content: Thought content normal.        Judgment: Judgment normal.  Vitals reviewed.     Assessment/Plan: Encounter for annual routine gynecological examination  Cervical cancer screening - Plan: Cytology - PAP  Screening for HPV (human papillomavirus) - Plan: Cytology -  PAP  Dysplasia of cervix, low grade (CIN 1) - Plan: Cytology - PAP; repeat pap today. Will f/u with results.   Encounter for surveillance of contraceptive pills - Plan: norethindrone-ethinyl estradiol-iron (LOESTRIN FE) 1.5-30 MG-MCG tablet; OCP RF  Yeast vaginitis--sx improved with probiotic use. F/u prn.    Meds ordered this encounter  Medications  . norethindrone-ethinyl estradiol-iron (LOESTRIN FE) 1.5-30 MG-MCG tablet    Sig: Take 1 tablet by mouth daily.    Dispense:  84 tablet    Refill:  3    Order Specific Question:   Supervising Provider    Answer:   Nadara Mustard [[id number]]             GYN counsel adequate intake of calcium and vitamin D, diet and exercise     F/U  Return in about 1 year (around 10/21/2021).  Thurl Boen B. Calen Posch, PA-C 10/21/2020 4:25 PM

## 2020-10-21 NOTE — Patient Instructions (Signed)
I value your feedback and you entrusting us with your care. If you get a  patient survey, I would appreciate you taking the time to let us know about your experience today. Thank you! ? ? ?

## 2020-10-24 LAB — CYTOLOGY - PAP
Adequacy: ABSENT
Comment: NEGATIVE
Diagnosis: NEGATIVE
High risk HPV: NEGATIVE

## 2020-10-28 DIAGNOSIS — F4321 Adjustment disorder with depressed mood: Secondary | ICD-10-CM | POA: Diagnosis not present

## 2020-10-29 DIAGNOSIS — L578 Other skin changes due to chronic exposure to nonionizing radiation: Secondary | ICD-10-CM | POA: Diagnosis not present

## 2020-10-29 DIAGNOSIS — D225 Melanocytic nevi of trunk: Secondary | ICD-10-CM | POA: Diagnosis not present

## 2020-10-29 DIAGNOSIS — L812 Freckles: Secondary | ICD-10-CM | POA: Diagnosis not present

## 2020-11-04 DIAGNOSIS — F4321 Adjustment disorder with depressed mood: Secondary | ICD-10-CM | POA: Diagnosis not present

## 2020-11-07 ENCOUNTER — Other Ambulatory Visit: Payer: Self-pay | Admitting: Obstetrics and Gynecology

## 2020-11-07 ENCOUNTER — Encounter: Payer: Self-pay | Admitting: Obstetrics and Gynecology

## 2020-11-07 MED ORDER — FLUCONAZOLE 150 MG PO TABS: 150.0000 mg | ORAL_TABLET | Freq: Once | ORAL | 0 refills | Status: AC

## 2020-11-07 NOTE — Progress Notes (Signed)
Rx diflucan for yeast vag sx.  

## 2020-11-18 DIAGNOSIS — F4321 Adjustment disorder with depressed mood: Secondary | ICD-10-CM | POA: Diagnosis not present

## 2020-11-25 DIAGNOSIS — F323 Major depressive disorder, single episode, severe with psychotic features: Secondary | ICD-10-CM | POA: Diagnosis not present

## 2020-12-02 ENCOUNTER — Encounter: Payer: Self-pay | Admitting: Obstetrics and Gynecology

## 2020-12-02 MED ORDER — FLUCONAZOLE 150 MG PO TABS: 150.0000 mg | ORAL_TABLET | Freq: Once | ORAL | 0 refills | Status: AC

## 2020-12-03 DIAGNOSIS — F323 Major depressive disorder, single episode, severe with psychotic features: Secondary | ICD-10-CM | POA: Diagnosis not present

## 2020-12-04 ENCOUNTER — Other Ambulatory Visit: Payer: Self-pay | Admitting: Family Medicine

## 2020-12-23 DIAGNOSIS — F323 Major depressive disorder, single episode, severe with psychotic features: Secondary | ICD-10-CM | POA: Diagnosis not present

## 2020-12-31 DIAGNOSIS — F323 Major depressive disorder, single episode, severe with psychotic features: Secondary | ICD-10-CM | POA: Diagnosis not present

## 2021-01-07 DIAGNOSIS — F323 Major depressive disorder, single episode, severe with psychotic features: Secondary | ICD-10-CM | POA: Diagnosis not present

## 2021-01-21 DIAGNOSIS — F323 Major depressive disorder, single episode, severe with psychotic features: Secondary | ICD-10-CM | POA: Diagnosis not present

## 2021-01-28 DIAGNOSIS — F323 Major depressive disorder, single episode, severe with psychotic features: Secondary | ICD-10-CM | POA: Diagnosis not present

## 2021-02-04 DIAGNOSIS — F323 Major depressive disorder, single episode, severe with psychotic features: Secondary | ICD-10-CM | POA: Diagnosis not present

## 2021-02-12 ENCOUNTER — Encounter: Payer: Self-pay | Admitting: Family Medicine

## 2021-02-13 MED ORDER — FLUCONAZOLE 150 MG PO TABS: 150.0000 mg | ORAL_TABLET | Freq: Once | ORAL | 0 refills | Status: AC

## 2021-02-13 MED ORDER — DOXYCYCLINE HYCLATE 100 MG PO TABS: 100.0000 mg | ORAL_TABLET | Freq: Two times a day (BID) | ORAL | 0 refills | Status: DC

## 2021-02-17 ENCOUNTER — Other Ambulatory Visit: Payer: Self-pay | Admitting: Family Medicine

## 2021-02-17 ENCOUNTER — Telehealth: Payer: Self-pay

## 2021-02-17 NOTE — Telephone Encounter (Signed)
Please schedule a f/u appt in January after we return to [facility] and refill med until then [employer]

## 2021-02-17 NOTE — Telephone Encounter (Signed)
Tried calling patient to set up an appointment with Dr. [name] per her request. Patient did not answer the phone. LVM for her to call back and schedule an in office appointment in January.

## 2021-02-17 NOTE — Telephone Encounter (Signed)
Last OV - 11/16/2018  Next OV - N/A Last canceled 22/16/2022 Last Filled - 11/16/2018

## 2021-02-18 DIAGNOSIS — F323 Major depressive disorder, single episode, severe with psychotic features: Secondary | ICD-10-CM | POA: Diagnosis not present

## 2021-02-24 DIAGNOSIS — F411 Generalized anxiety disorder: Secondary | ICD-10-CM | POA: Diagnosis not present

## 2021-02-24 DIAGNOSIS — F5105 Insomnia due to other mental disorder: Secondary | ICD-10-CM | POA: Diagnosis not present

## 2021-02-24 DIAGNOSIS — F331 Major depressive disorder, recurrent, moderate: Secondary | ICD-10-CM | POA: Diagnosis not present

## 2021-02-25 DIAGNOSIS — F323 Major depressive disorder, single episode, severe with psychotic features: Secondary | ICD-10-CM | POA: Diagnosis not present

## 2021-02-25 MED ORDER — BENZONATATE 200 MG PO CAPS: 200.0000 mg | ORAL_CAPSULE | Freq: Three times a day (TID) | ORAL | 1 refills | Status: DC | PRN

## 2021-02-25 NOTE — Addendum Note (Signed)
Addended by: Roxy Manns A on: 02/25/2021 06:53 PM   Modules accepted: Orders

## 2021-03-04 DIAGNOSIS — F323 Major depressive disorder, single episode, severe with psychotic features: Secondary | ICD-10-CM | POA: Diagnosis not present

## 2021-03-05 DIAGNOSIS — J069 Acute upper respiratory infection, unspecified: Secondary | ICD-10-CM | POA: Diagnosis not present

## 2021-03-10 DIAGNOSIS — F5105 Insomnia due to other mental disorder: Secondary | ICD-10-CM | POA: Diagnosis not present

## 2021-03-10 DIAGNOSIS — F411 Generalized anxiety disorder: Secondary | ICD-10-CM | POA: Diagnosis not present

## 2021-03-10 DIAGNOSIS — F331 Major depressive disorder, recurrent, moderate: Secondary | ICD-10-CM | POA: Diagnosis not present

## 2021-03-18 DIAGNOSIS — F323 Major depressive disorder, single episode, severe with psychotic features: Secondary | ICD-10-CM | POA: Diagnosis not present

## 2021-03-20 ENCOUNTER — Ambulatory Visit: Payer: BC Managed Care – PPO

## 2021-03-30 DIAGNOSIS — F331 Major depressive disorder, recurrent, moderate: Secondary | ICD-10-CM | POA: Diagnosis not present

## 2021-03-30 DIAGNOSIS — F5105 Insomnia due to other mental disorder: Secondary | ICD-10-CM | POA: Diagnosis not present

## 2021-03-30 DIAGNOSIS — F411 Generalized anxiety disorder: Secondary | ICD-10-CM | POA: Diagnosis not present

## 2021-04-01 DIAGNOSIS — F323 Major depressive disorder, single episode, severe with psychotic features: Secondary | ICD-10-CM | POA: Diagnosis not present

## 2021-04-15 DIAGNOSIS — F323 Major depressive disorder, single episode, severe with psychotic features: Secondary | ICD-10-CM | POA: Diagnosis not present

## 2021-04-28 DIAGNOSIS — F5105 Insomnia due to other mental disorder: Secondary | ICD-10-CM | POA: Diagnosis not present

## 2021-04-28 DIAGNOSIS — F331 Major depressive disorder, recurrent, moderate: Secondary | ICD-10-CM | POA: Diagnosis not present

## 2021-04-28 DIAGNOSIS — F411 Generalized anxiety disorder: Secondary | ICD-10-CM | POA: Diagnosis not present

## 2021-05-06 DIAGNOSIS — F411 Generalized anxiety disorder: Secondary | ICD-10-CM | POA: Diagnosis not present

## 2021-05-06 DIAGNOSIS — F324 Major depressive disorder, single episode, in partial remission: Secondary | ICD-10-CM | POA: Diagnosis not present

## 2021-05-13 DIAGNOSIS — F411 Generalized anxiety disorder: Secondary | ICD-10-CM | POA: Diagnosis not present

## 2021-05-13 DIAGNOSIS — F324 Major depressive disorder, single episode, in partial remission: Secondary | ICD-10-CM | POA: Diagnosis not present

## 2021-05-17 DIAGNOSIS — Z20822 Contact with and (suspected) exposure to covid-19: Secondary | ICD-10-CM | POA: Diagnosis not present

## 2021-05-17 DIAGNOSIS — R070 Pain in throat: Secondary | ICD-10-CM | POA: Diagnosis not present

## 2021-05-17 DIAGNOSIS — B349 Viral infection, unspecified: Secondary | ICD-10-CM | POA: Diagnosis not present

## 2021-05-20 DIAGNOSIS — F324 Major depressive disorder, single episode, in partial remission: Secondary | ICD-10-CM | POA: Diagnosis not present

## 2021-05-20 DIAGNOSIS — F411 Generalized anxiety disorder: Secondary | ICD-10-CM | POA: Diagnosis not present

## 2021-05-27 DIAGNOSIS — F324 Major depressive disorder, single episode, in partial remission: Secondary | ICD-10-CM | POA: Diagnosis not present

## 2021-05-27 DIAGNOSIS — F411 Generalized anxiety disorder: Secondary | ICD-10-CM | POA: Diagnosis not present

## 2021-05-28 ENCOUNTER — Telehealth: Payer: Self-pay | Admitting: Family Medicine

## 2021-05-28 MED ORDER — PENICILLIN V POTASSIUM 500 MG PO TABS: 500.0000 mg | ORAL_TABLET | Freq: Three times a day (TID) | ORAL | 0 refills | Status: AC

## 2021-05-28 MED ORDER — FLUCONAZOLE 150 MG PO TABS: 150.0000 mg | ORAL_TABLET | Freq: Once | ORAL | 0 refills | Status: AC

## 2021-05-28 NOTE — Telephone Encounter (Signed)
Sore throat with exp to kids at home with strep   [facility] [address]  Let her know I sent it and ask how she is feeling, thanks

## 2021-05-28 NOTE — Telephone Encounter (Signed)
Spoke with pt relaying Dr. Royden Purl message.  Pt verbalizes understanding and requests rx for yeast inf since she usually gets one when taking abx.   Says she is feeling better.  But may be due to taking 2 pills of an old amoxicillin rx. Pt is asking if ok to switch to PCN rx.   Per Dr. [name], ok for pt to start PCN rx and authorized rx for Diflucan 150 mg, #1 tab for 1 dose.    E-scribed Diflucan rx  Informed pt Dr. [name] gives "ok" to start PCN and that a 1 time dose of Diflucan was sent to pharmacy.  Pt verbalizes understanding and expresses her thanks.

## 2021-06-10 DIAGNOSIS — F411 Generalized anxiety disorder: Secondary | ICD-10-CM | POA: Diagnosis not present

## 2021-06-10 DIAGNOSIS — F324 Major depressive disorder, single episode, in partial remission: Secondary | ICD-10-CM | POA: Diagnosis not present

## 2021-06-11 DIAGNOSIS — F5105 Insomnia due to other mental disorder: Secondary | ICD-10-CM | POA: Diagnosis not present

## 2021-06-11 DIAGNOSIS — F411 Generalized anxiety disorder: Secondary | ICD-10-CM | POA: Diagnosis not present

## 2021-06-11 DIAGNOSIS — F331 Major depressive disorder, recurrent, moderate: Secondary | ICD-10-CM | POA: Diagnosis not present

## 2021-06-17 DIAGNOSIS — F324 Major depressive disorder, single episode, in partial remission: Secondary | ICD-10-CM | POA: Diagnosis not present

## 2021-06-17 DIAGNOSIS — F411 Generalized anxiety disorder: Secondary | ICD-10-CM | POA: Diagnosis not present

## 2021-06-24 DIAGNOSIS — F411 Generalized anxiety disorder: Secondary | ICD-10-CM | POA: Diagnosis not present

## 2021-06-24 DIAGNOSIS — F324 Major depressive disorder, single episode, in partial remission: Secondary | ICD-10-CM | POA: Diagnosis not present

## 2021-07-10 DIAGNOSIS — F324 Major depressive disorder, single episode, in partial remission: Secondary | ICD-10-CM | POA: Diagnosis not present

## 2021-07-10 DIAGNOSIS — F411 Generalized anxiety disorder: Secondary | ICD-10-CM | POA: Diagnosis not present

## 2021-07-15 DIAGNOSIS — F324 Major depressive disorder, single episode, in partial remission: Secondary | ICD-10-CM | POA: Diagnosis not present

## 2021-07-15 DIAGNOSIS — F411 Generalized anxiety disorder: Secondary | ICD-10-CM | POA: Diagnosis not present

## 2021-07-24 DIAGNOSIS — F324 Major depressive disorder, single episode, in partial remission: Secondary | ICD-10-CM | POA: Diagnosis not present

## 2021-07-24 DIAGNOSIS — F411 Generalized anxiety disorder: Secondary | ICD-10-CM | POA: Diagnosis not present

## 2021-07-29 DIAGNOSIS — F324 Major depressive disorder, single episode, in partial remission: Secondary | ICD-10-CM | POA: Diagnosis not present

## 2021-07-29 DIAGNOSIS — F411 Generalized anxiety disorder: Secondary | ICD-10-CM | POA: Diagnosis not present

## 2021-08-12 DIAGNOSIS — F411 Generalized anxiety disorder: Secondary | ICD-10-CM | POA: Diagnosis not present

## 2021-08-12 DIAGNOSIS — F324 Major depressive disorder, single episode, in partial remission: Secondary | ICD-10-CM | POA: Diagnosis not present

## 2021-08-26 DIAGNOSIS — F411 Generalized anxiety disorder: Secondary | ICD-10-CM | POA: Diagnosis not present

## 2021-08-26 DIAGNOSIS — F324 Major depressive disorder, single episode, in partial remission: Secondary | ICD-10-CM | POA: Diagnosis not present

## 2021-09-02 DIAGNOSIS — F411 Generalized anxiety disorder: Secondary | ICD-10-CM | POA: Diagnosis not present

## 2021-09-02 DIAGNOSIS — F324 Major depressive disorder, single episode, in partial remission: Secondary | ICD-10-CM | POA: Diagnosis not present

## 2021-09-07 ENCOUNTER — Encounter: Payer: Self-pay | Admitting: Obstetrics and Gynecology

## 2021-09-08 ENCOUNTER — Other Ambulatory Visit: Payer: Self-pay | Admitting: Obstetrics and Gynecology

## 2021-09-08 MED ORDER — FLUCONAZOLE 150 MG PO TABS: 150.0000 mg | ORAL_TABLET | Freq: Once | ORAL | 0 refills | Status: AC

## 2021-09-08 NOTE — Progress Notes (Signed)
Rx RF diflucan for yeast vag sx 

## 2021-09-09 DIAGNOSIS — F411 Generalized anxiety disorder: Secondary | ICD-10-CM | POA: Diagnosis not present

## 2021-09-09 DIAGNOSIS — F324 Major depressive disorder, single episode, in partial remission: Secondary | ICD-10-CM | POA: Diagnosis not present

## 2021-09-22 DIAGNOSIS — F411 Generalized anxiety disorder: Secondary | ICD-10-CM | POA: Diagnosis not present

## 2021-09-22 DIAGNOSIS — F331 Major depressive disorder, recurrent, moderate: Secondary | ICD-10-CM | POA: Diagnosis not present

## 2021-09-22 DIAGNOSIS — F5105 Insomnia due to other mental disorder: Secondary | ICD-10-CM | POA: Diagnosis not present

## 2021-09-23 DIAGNOSIS — F411 Generalized anxiety disorder: Secondary | ICD-10-CM | POA: Diagnosis not present

## 2021-09-23 DIAGNOSIS — F324 Major depressive disorder, single episode, in partial remission: Secondary | ICD-10-CM | POA: Diagnosis not present

## 2021-09-28 DIAGNOSIS — B9689 Other specified bacterial agents as the cause of diseases classified elsewhere: Secondary | ICD-10-CM | POA: Diagnosis not present

## 2021-09-28 DIAGNOSIS — H109 Unspecified conjunctivitis: Secondary | ICD-10-CM | POA: Diagnosis not present

## 2021-09-28 DIAGNOSIS — B3731 Acute candidiasis of vulva and vagina: Secondary | ICD-10-CM | POA: Diagnosis not present

## 2021-09-28 DIAGNOSIS — J019 Acute sinusitis, unspecified: Secondary | ICD-10-CM | POA: Diagnosis not present

## 2021-10-07 DIAGNOSIS — F411 Generalized anxiety disorder: Secondary | ICD-10-CM | POA: Diagnosis not present

## 2021-10-07 DIAGNOSIS — F324 Major depressive disorder, single episode, in partial remission: Secondary | ICD-10-CM | POA: Diagnosis not present

## 2021-10-17 ENCOUNTER — Encounter: Payer: Self-pay | Admitting: Obstetrics and Gynecology

## 2021-10-17 DIAGNOSIS — Z3041 Encounter for surveillance of contraceptive pills: Secondary | ICD-10-CM

## 2021-10-19 MED ORDER — NORETHIN ACE-ETH ESTRAD-FE 1.5-30 MG-MCG PO TABS: 1.0000 | ORAL_TABLET | Freq: Every day | ORAL | 0 refills | Status: DC

## 2021-10-20 DIAGNOSIS — F411 Generalized anxiety disorder: Secondary | ICD-10-CM | POA: Diagnosis not present

## 2021-10-20 DIAGNOSIS — F324 Major depressive disorder, single episode, in partial remission: Secondary | ICD-10-CM | POA: Diagnosis not present

## 2021-10-26 DIAGNOSIS — F324 Major depressive disorder, single episode, in partial remission: Secondary | ICD-10-CM | POA: Diagnosis not present

## 2021-10-26 DIAGNOSIS — F411 Generalized anxiety disorder: Secondary | ICD-10-CM | POA: Diagnosis not present

## 2021-11-03 DIAGNOSIS — F324 Major depressive disorder, single episode, in partial remission: Secondary | ICD-10-CM | POA: Diagnosis not present

## 2021-11-03 DIAGNOSIS — F411 Generalized anxiety disorder: Secondary | ICD-10-CM | POA: Diagnosis not present

## 2021-11-11 DIAGNOSIS — F324 Major depressive disorder, single episode, in partial remission: Secondary | ICD-10-CM | POA: Diagnosis not present

## 2021-11-11 DIAGNOSIS — D225 Melanocytic nevi of trunk: Secondary | ICD-10-CM | POA: Diagnosis not present

## 2021-11-11 DIAGNOSIS — F411 Generalized anxiety disorder: Secondary | ICD-10-CM | POA: Diagnosis not present

## 2021-11-11 DIAGNOSIS — L812 Freckles: Secondary | ICD-10-CM | POA: Diagnosis not present

## 2021-11-11 DIAGNOSIS — L578 Other skin changes due to chronic exposure to nonionizing radiation: Secondary | ICD-10-CM | POA: Diagnosis not present

## 2021-11-25 DIAGNOSIS — F324 Major depressive disorder, single episode, in partial remission: Secondary | ICD-10-CM | POA: Diagnosis not present

## 2021-11-25 DIAGNOSIS — F411 Generalized anxiety disorder: Secondary | ICD-10-CM | POA: Diagnosis not present

## 2021-12-02 ENCOUNTER — Encounter: Payer: Self-pay | Admitting: Family Medicine

## 2021-12-02 ENCOUNTER — Ambulatory Visit (INDEPENDENT_AMBULATORY_CARE_PROVIDER_SITE_OTHER): Payer: BC Managed Care – PPO | Admitting: Family Medicine

## 2021-12-02 VITALS — BP 100/70 | HR 94 | Temp 98.4°F | Ht 63.0 in | Wt 203.5 lb

## 2021-12-02 DIAGNOSIS — L0889 Other specified local infections of the skin and subcutaneous tissue: Secondary | ICD-10-CM

## 2021-12-02 DIAGNOSIS — L03317 Cellulitis of buttock: Secondary | ICD-10-CM

## 2021-12-02 MED ORDER — FLUCONAZOLE 150 MG PO TABS: ORAL_TABLET | ORAL | 0 refills | Status: DC

## 2021-12-02 MED ORDER — DOXYCYCLINE HYCLATE 100 MG PO TABS: 100.0000 mg | ORAL_TABLET | Freq: Two times a day (BID) | ORAL | 0 refills | Status: DC

## 2021-12-02 NOTE — Progress Notes (Signed)
Shawna Tooker T. [name], MD, CAQ [facility] at [facility] [address] [place], [address]  Phone: [phone]  FAX: [phone]  Craig Shawna - 36 y.o. female  MRN [id number]  Date of Birth: April 30, 1986  Date: 12/02/2021  PCP: Judy Pimple, MD  Referral: Judy Pimple, MD  Chief Complaint  Patient presents with   Pilonidal Cyst   Subjective:   Shawna Craig is a 36 y.o. very pleasant female patient with Body mass index is 36.05 kg/m. who presents with the following:  Buttocks cellulitis: Patient has area of pain and redness in the region of the pilonidal area with a history of prior pilonidal cyst and abscess.  Previously, she has required I&D.  Today there is no fluctuance or palpable abnormality, but it is painful and there is adjacent redness.  There is been no pus.  She has no systemic fever, or other associated symptoms.  There is been no trauma to the region.  No other inciting event. The area in question is the intergluteal cleft.    Review of Systems is noted in the HPI, as appropriate  Objective:   BP 100/70   Pulse 94   Temp 98.4 F (36.9 C) (Oral)   Ht 5\' 3"  (1.6 m)   Wt 203 lb 8 oz (92.3 kg)   LMP 11/11/2021   SpO2 99%   BMI 36.05 kg/m   GEN: No acute distress; alert,appropriate. PULM: Breathing comfortably in no respiratory distress PSYCH: Normally interactive.   This portion of the physical examination was chaperoned by 11/13/2021, CMA.  At the intergluteal cleft there is some redness without a palpable abscess.  I do not appreciate any significant induration and there is no fluctuance.  It is somewhat tender to palpation.    Laboratory and Imaging Data:  Assessment and Plan:     ICD-10-CM   1. Cellulitis of buttock  L03.317     2. Pilonidal disease of natal cleft  L08.89      History of prior pilonidal cyst and abscess.  Today, I do not appreciate any abscess on exam.  There is some  redness that would be most consistent with a pilonidal cellulitis.  Cellulitis in the intergluteal cleft.  Certainly, an abscess could develop but at this point there is nothing that would potentially be drainable.  I am gonna start the patient on some doxycycline, and if additional abscess does develop then this could need incision and drainage.  Hopefully supportive care and antibiotics will be all that is needed in this case.  Medication Management during today's office visit: Meds ordered this encounter  Medications   doxycycline (VIBRA-TABS) 100 MG tablet    Sig: Take 1 tablet (100 mg total) by mouth 2 (two) times daily.    Dispense:  20 tablet    Refill:  0   fluconazole (DIFLUCAN) 150 MG tablet    Sig: Take 1 tab po today, repeat in 7 days if needed    Dispense:  2 tablet    Refill:  0   Medications Discontinued During This Encounter  Medication Reason   benzonatate (TESSALON) 200 MG capsule Completed Course   EPINEPHrine 0.3 mg/0.3 mL IJ SOAJ injection Patient Preference   fluticasone (FLONASE) 50 MCG/ACT nasal spray Completed Course   sertraline (ZOLOFT) 50 MG tablet Change in therapy   terconazole (TERAZOL 7) 0.4 % vaginal cream Completed Course    Orders placed today for conditions managed today:  No orders of the defined types were placed in this encounter.   Follow-up if needed: No follow-ups on file.  Dragon [employer] One speech-to-text software was used for transcription in this dictation.  Possible transcriptional errors can occur using Animal nutritionist.   Signed,  Elpidio Galea. Saba Neuman, MD   Outpatient Encounter Medications as of 12/02/2021  Medication Sig   buPROPion (WELLBUTRIN SR) 150 MG 12 hr tablet Take 150 mg by mouth 2 (two) times daily.   doxycycline (VIBRA-TABS) 100 MG tablet Take 1 tablet (100 mg total) by mouth 2 (two) times daily.   fluconazole (DIFLUCAN) 150 MG tablet Take 1 tab po today, repeat in 7 days if needed   norethindrone-ethinyl  estradiol-iron (LOESTRIN FE) 1.5-30 MG-MCG tablet Take 1 tablet by mouth daily.   Probiotic Product (PROBIOTIC PO) Take 1 tablet by mouth daily.   [DISCONTINUED] benzonatate (TESSALON) 200 MG capsule Take 1 capsule (200 mg total) by mouth 3 (three) times daily as needed for cough.   [DISCONTINUED] EPINEPHrine 0.3 mg/0.3 mL IJ SOAJ injection Inject 0.3 mLs (0.3 mg total) into the muscle as needed for anaphylaxis.   [DISCONTINUED] fluticasone (FLONASE) 50 MCG/ACT nasal spray Place 2 sprays into both nostrils daily.   [DISCONTINUED] sertraline (ZOLOFT) 50 MG tablet TAKE 1 TABLET BY MOUTH EVERY DAY   [DISCONTINUED] terconazole (TERAZOL 7) 0.4 % vaginal cream Place 1 applicator vaginally at bedtime.   No facility-administered encounter medications on file as of 12/02/2021.

## 2021-12-03 DIAGNOSIS — F324 Major depressive disorder, single episode, in partial remission: Secondary | ICD-10-CM | POA: Diagnosis not present

## 2021-12-03 DIAGNOSIS — F411 Generalized anxiety disorder: Secondary | ICD-10-CM | POA: Diagnosis not present

## 2021-12-14 DIAGNOSIS — F324 Major depressive disorder, single episode, in partial remission: Secondary | ICD-10-CM | POA: Diagnosis not present

## 2021-12-14 DIAGNOSIS — F411 Generalized anxiety disorder: Secondary | ICD-10-CM | POA: Diagnosis not present

## 2021-12-15 ENCOUNTER — Encounter: Payer: Self-pay | Admitting: Obstetrics and Gynecology

## 2021-12-15 ENCOUNTER — Other Ambulatory Visit (HOSPITAL_COMMUNITY)
Admission: RE | Admit: 2021-12-15 | Discharge: 2021-12-15 | Disposition: A | Discharge status: Home / Self Care | Payer: BC Managed Care – PPO | Source: Ambulatory Visit | Attending: Obstetrics and Gynecology | Admitting: Obstetrics and Gynecology

## 2021-12-15 ENCOUNTER — Ambulatory Visit (INDEPENDENT_AMBULATORY_CARE_PROVIDER_SITE_OTHER): Payer: BC Managed Care – PPO | Admitting: Obstetrics and Gynecology

## 2021-12-15 VITALS — BP 118/70 | Ht 63.0 in | Wt 202.0 lb

## 2021-12-15 DIAGNOSIS — N870 Mild cervical dysplasia: Secondary | ICD-10-CM | POA: Diagnosis not present

## 2021-12-15 DIAGNOSIS — Z124 Encounter for screening for malignant neoplasm of cervix: Secondary | ICD-10-CM | POA: Insufficient documentation

## 2021-12-15 DIAGNOSIS — Z01419 Encounter for gynecological examination (general) (routine) without abnormal findings: Secondary | ICD-10-CM | POA: Diagnosis not present

## 2021-12-15 DIAGNOSIS — Z1151 Encounter for screening for human papillomavirus (HPV): Secondary | ICD-10-CM

## 2021-12-15 DIAGNOSIS — Z3041 Encounter for surveillance of contraceptive pills: Secondary | ICD-10-CM

## 2021-12-15 MED ORDER — NORETHIN ACE-ETH ESTRAD-FE 1.5-30 MG-MCG PO TABS: 1.0000 | ORAL_TABLET | Freq: Every day | ORAL | 3 refills | Status: DC

## 2021-12-15 NOTE — Progress Notes (Signed)
PCP:  Judy Pimple, MD   Chief Complaint  Patient presents with   Annual Exam     HPI:      Shawna Craig is a 36 y.o. [id number] whose LMP was Patient's last menstrual period was 12/10/2021 (exact date)., presents today for her annual examination.  Her menses are regular every 28-30 days, lasting 3-4 days.  Dysmenorrhea mild, worse than in the past but still no meds needed. She does not have intermenstrual bleeding.  Sex activity: single partner, contraception - OCP (estrogen/progesterone). No pain/bleeding. Last Pap: 10/21/20 Results were normal/neg HPV DNA. 05/05/20 (6 mo repeat pap after colpo/bx 6/21)  Results were ASCUS/neg HPV DNA. 2020 neg per pt report; hx of HPV on pap 2019 (can't find copy of paps in chart) and 2021; CIN1 on colpo/bx with Dr. Tiburcio Pea 6/21; repeat pap due today Hx of STDs: ext genital warts, treated 9/20  Hx of recurrent yeast vag, sx much improved with addition of probiotics. 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 Was on abx recently and developed yeast vag sx; took diflucan today.  There is no FH of breast cancer. There is no FH of ovarian cancer. The patient does do self-breast exams.  Tobacco use: The patient denies current or previous tobacco use. Alcohol use: none No drug use.  Exercise: very active  She does get adequate calcium but not Vitamin D in her diet.  Past Medical History:  Diagnosis Date   Cervical high risk human papillomavirus (HPV) DNA test positive    Genital warts    Hidradenitis suppurativa    Vaccine for human papilloma virus (HPV) types 6, 11, 16, and 18 administered    Yeast vaginitis     Past Surgical History:  Procedure Laterality Date   TONSILLECTOMY AND ADENOIDECTOMY     2007   WISDOM TOOTH EXTRACTION      Family History  Problem Relation Age of Onset   Breast cancer Neg Hx    Ovarian cancer Neg Hx     Social History   Socioeconomic History   Marital status: Widowed    Spouse name: Onalee Hua    Number of children: 1   Years of education: Not on file   Highest education level: Not on file  Occupational History   Occupation: exercise teacher     Employer: [employer]  Tobacco Use   Smoking status: Never   Smokeless tobacco: Never  Vaping Use   Vaping Use: Never used  Substance and Sexual Activity   Alcohol use: No    Alcohol/week: 0.0 standard drinks of alcohol   Drug use: No   Sexual activity: Yes    Birth control/protection: Pill  Other Topics Concern   Not on file  Social History Narrative   Pt is married (Dr Karle Starch daughter in Social worker) , and has a 57 mo old daughter   Stays home and also teaches exercise classes at the [employer] on finishing a degree on line    Social Determinants of Health   Financial Resource Strain: Not on file  Food Insecurity: Not on file  Transportation Needs: Not on file  Physical Activity: Not on file  Stress: Not on file  Social Connections: Not on file  Intimate Partner Violence: Not on file     Current Outpatient Medications:    buPROPion (WELLBUTRIN SR) 150 MG 12 hr tablet, Take 150 mg by mouth 2 (two) times daily., Disp: , Rfl:    Probiotic  Product (PROBIOTIC PO), Take 1 tablet by mouth daily., Disp: , Rfl:    norethindrone-ethinyl estradiol-iron (LOESTRIN FE) 1.5-30 MG-MCG tablet, Take 1 tablet by mouth daily., Disp: 84 tablet, Rfl: 3     ROS:  Review of Systems  Constitutional:  Negative for fatigue, fever and unexpected weight change.  Respiratory:  Negative for cough, shortness of breath and wheezing.   Cardiovascular:  Negative for chest pain, palpitations and leg swelling.  Gastrointestinal:  Negative for blood in stool, constipation, diarrhea, nausea and vomiting.  Endocrine: Negative for cold intolerance, heat intolerance and polyuria.  Genitourinary:  Negative for dyspareunia, dysuria, flank pain, frequency, genital sores, hematuria, menstrual problem, pelvic pain, urgency, vaginal bleeding, vaginal discharge and vaginal  pain.  Musculoskeletal:  Negative for back pain, joint swelling and myalgias.  Skin:  Negative for rash.  Neurological:  Negative for dizziness, syncope, light-headedness, numbness and headaches.  Hematological:  Negative for adenopathy.  Psychiatric/Behavioral:  Negative for agitation, confusion, dysphoric mood, sleep disturbance and suicidal ideas. The patient is not nervous/anxious.   BREAST: No symptoms   Objective: BP 118/70   Ht 5\' 3"  (1.6 m)   Wt 202 lb (91.6 kg)   LMP 12/10/2021 (Exact Date)   BMI 35.78 kg/m    Physical Exam Constitutional:      Appearance: She is well-developed.  Genitourinary:     Vulva normal.     Right Labia: No rash, tenderness or lesions.    Left Labia: No tenderness, lesions or rash.    No vaginal discharge, erythema or tenderness.      Right Adnexa: not tender and no mass present.    Left Adnexa: not tender and no mass present.    No cervical friability or polyp.     Uterus is not enlarged or tender.  Breasts:    Right: No mass, nipple discharge, skin change or tenderness.     Left: No mass, nipple discharge, skin change or tenderness.  Neck:     Thyroid: No thyromegaly.  Cardiovascular:     Rate and Rhythm: Normal rate and regular rhythm.     Heart sounds: Normal heart sounds. No murmur heard. Pulmonary:     Effort: Pulmonary effort is normal.     Breath sounds: Normal breath sounds.  Abdominal:     Palpations: Abdomen is soft.     Tenderness: There is no abdominal tenderness. There is no guarding or rebound.  Musculoskeletal:        General: Normal range of motion.     Cervical back: Normal range of motion.  Lymphadenopathy:     Cervical: No cervical adenopathy.  Neurological:     General: No focal deficit present.     Mental Status: She is alert and oriented to person, place, and time.     Cranial Nerves: No cranial nerve deficit.  Skin:    General: Skin is warm and dry.  Psychiatric:        Mood and Affect: Mood normal.         Behavior: Behavior normal.        Thought Content: Thought content normal.        Judgment: Judgment normal.  Vitals reviewed.     Assessment/Plan: Encounter for annual routine gynecological examination  Cervical cancer screening - Plan: Cytology - PAP  Screening for HPV (human papillomavirus) - Plan: Cytology - PAP  Dysplasia of cervix, low grade (CIN 1) - Plan: Cytology - PAP; repeat today. Will f/u with results.   Encounter  for surveillance of contraceptive pills - Plan: norethindrone-ethinyl estradiol-iron (LOESTRIN FE) 1.5-30 MG-MCG tablet; OCP RF  Yeast vaginitis--sx improved with probiotic use. F/u prn.    Meds ordered this encounter  Medications   norethindrone-ethinyl estradiol-iron (LOESTRIN FE) 1.5-30 MG-MCG tablet    Sig: Take 1 tablet by mouth daily.    Dispense:  84 tablet    Refill:  3    Order Specific Question:   Supervising Provider    Answer:   Waymon Budge             GYN counsel adequate intake of calcium and vitamin D, diet and exercise     F/U  Return in about 1 year (around 12/16/2022).  Jasira Robinson B. Rochell Puett, PA-C 12/15/2021 2:19 PM

## 2021-12-15 NOTE — Patient Instructions (Signed)
I value your feedback and you entrusting us with your care. If you get a [facility] patient survey, I would appreciate you taking the time to let us know about your experience today. Thank you! ? ? ?

## 2021-12-16 DIAGNOSIS — M9903 Segmental and somatic dysfunction of lumbar region: Secondary | ICD-10-CM | POA: Diagnosis not present

## 2021-12-16 DIAGNOSIS — M9901 Segmental and somatic dysfunction of cervical region: Secondary | ICD-10-CM | POA: Diagnosis not present

## 2021-12-16 DIAGNOSIS — M9904 Segmental and somatic dysfunction of sacral region: Secondary | ICD-10-CM | POA: Diagnosis not present

## 2021-12-16 DIAGNOSIS — M9905 Segmental and somatic dysfunction of pelvic region: Secondary | ICD-10-CM | POA: Diagnosis not present

## 2021-12-17 DIAGNOSIS — M9901 Segmental and somatic dysfunction of cervical region: Secondary | ICD-10-CM | POA: Diagnosis not present

## 2021-12-17 DIAGNOSIS — M9904 Segmental and somatic dysfunction of sacral region: Secondary | ICD-10-CM | POA: Diagnosis not present

## 2021-12-17 DIAGNOSIS — M9905 Segmental and somatic dysfunction of pelvic region: Secondary | ICD-10-CM | POA: Diagnosis not present

## 2021-12-17 DIAGNOSIS — M9903 Segmental and somatic dysfunction of lumbar region: Secondary | ICD-10-CM | POA: Diagnosis not present

## 2021-12-18 ENCOUNTER — Encounter: Payer: Self-pay | Admitting: Obstetrics and Gynecology

## 2021-12-18 LAB — CYTOLOGY - PAP
Comment: NEGATIVE
Diagnosis: UNDETERMINED — AB
High risk HPV: NEGATIVE

## 2021-12-21 NOTE — Telephone Encounter (Signed)
Msg already taken care of under lab results.

## 2021-12-28 DIAGNOSIS — F324 Major depressive disorder, single episode, in partial remission: Secondary | ICD-10-CM | POA: Diagnosis not present

## 2021-12-28 DIAGNOSIS — F411 Generalized anxiety disorder: Secondary | ICD-10-CM | POA: Diagnosis not present

## 2021-12-28 DIAGNOSIS — M9903 Segmental and somatic dysfunction of lumbar region: Secondary | ICD-10-CM | POA: Diagnosis not present

## 2021-12-28 DIAGNOSIS — M9901 Segmental and somatic dysfunction of cervical region: Secondary | ICD-10-CM | POA: Diagnosis not present

## 2021-12-28 DIAGNOSIS — M9905 Segmental and somatic dysfunction of pelvic region: Secondary | ICD-10-CM | POA: Diagnosis not present

## 2021-12-28 DIAGNOSIS — M9904 Segmental and somatic dysfunction of sacral region: Secondary | ICD-10-CM | POA: Diagnosis not present

## 2021-12-30 DIAGNOSIS — M9905 Segmental and somatic dysfunction of pelvic region: Secondary | ICD-10-CM | POA: Diagnosis not present

## 2021-12-30 DIAGNOSIS — M9904 Segmental and somatic dysfunction of sacral region: Secondary | ICD-10-CM | POA: Diagnosis not present

## 2021-12-30 DIAGNOSIS — M9901 Segmental and somatic dysfunction of cervical region: Secondary | ICD-10-CM | POA: Diagnosis not present

## 2021-12-30 DIAGNOSIS — M9903 Segmental and somatic dysfunction of lumbar region: Secondary | ICD-10-CM | POA: Diagnosis not present

## 2022-01-04 DIAGNOSIS — M9905 Segmental and somatic dysfunction of pelvic region: Secondary | ICD-10-CM | POA: Diagnosis not present

## 2022-01-04 DIAGNOSIS — M9904 Segmental and somatic dysfunction of sacral region: Secondary | ICD-10-CM | POA: Diagnosis not present

## 2022-01-04 DIAGNOSIS — M9903 Segmental and somatic dysfunction of lumbar region: Secondary | ICD-10-CM | POA: Diagnosis not present

## 2022-01-04 DIAGNOSIS — M9901 Segmental and somatic dysfunction of cervical region: Secondary | ICD-10-CM | POA: Diagnosis not present

## 2022-01-06 DIAGNOSIS — M9904 Segmental and somatic dysfunction of sacral region: Secondary | ICD-10-CM | POA: Diagnosis not present

## 2022-01-06 DIAGNOSIS — M9901 Segmental and somatic dysfunction of cervical region: Secondary | ICD-10-CM | POA: Diagnosis not present

## 2022-01-06 DIAGNOSIS — M9905 Segmental and somatic dysfunction of pelvic region: Secondary | ICD-10-CM | POA: Diagnosis not present

## 2022-01-06 DIAGNOSIS — M9903 Segmental and somatic dysfunction of lumbar region: Secondary | ICD-10-CM | POA: Diagnosis not present

## 2022-01-11 DIAGNOSIS — M9905 Segmental and somatic dysfunction of pelvic region: Secondary | ICD-10-CM | POA: Diagnosis not present

## 2022-01-11 DIAGNOSIS — M9904 Segmental and somatic dysfunction of sacral region: Secondary | ICD-10-CM | POA: Diagnosis not present

## 2022-01-11 DIAGNOSIS — M9903 Segmental and somatic dysfunction of lumbar region: Secondary | ICD-10-CM | POA: Diagnosis not present

## 2022-01-11 DIAGNOSIS — M9901 Segmental and somatic dysfunction of cervical region: Secondary | ICD-10-CM | POA: Diagnosis not present

## 2022-01-13 DIAGNOSIS — M9904 Segmental and somatic dysfunction of sacral region: Secondary | ICD-10-CM | POA: Diagnosis not present

## 2022-01-13 DIAGNOSIS — M9905 Segmental and somatic dysfunction of pelvic region: Secondary | ICD-10-CM | POA: Diagnosis not present

## 2022-01-13 DIAGNOSIS — M9901 Segmental and somatic dysfunction of cervical region: Secondary | ICD-10-CM | POA: Diagnosis not present

## 2022-01-13 DIAGNOSIS — M9903 Segmental and somatic dysfunction of lumbar region: Secondary | ICD-10-CM | POA: Diagnosis not present

## 2022-01-14 DIAGNOSIS — F411 Generalized anxiety disorder: Secondary | ICD-10-CM | POA: Diagnosis not present

## 2022-01-14 DIAGNOSIS — F324 Major depressive disorder, single episode, in partial remission: Secondary | ICD-10-CM | POA: Diagnosis not present

## 2022-01-19 DIAGNOSIS — M9904 Segmental and somatic dysfunction of sacral region: Secondary | ICD-10-CM | POA: Diagnosis not present

## 2022-01-19 DIAGNOSIS — M9901 Segmental and somatic dysfunction of cervical region: Secondary | ICD-10-CM | POA: Diagnosis not present

## 2022-01-19 DIAGNOSIS — M9903 Segmental and somatic dysfunction of lumbar region: Secondary | ICD-10-CM | POA: Diagnosis not present

## 2022-01-19 DIAGNOSIS — M9905 Segmental and somatic dysfunction of pelvic region: Secondary | ICD-10-CM | POA: Diagnosis not present

## 2022-01-20 ENCOUNTER — Encounter: Payer: Self-pay | Admitting: Family Medicine

## 2022-01-20 ENCOUNTER — Encounter: Payer: BC Managed Care – PPO | Admitting: Family Medicine

## 2022-01-21 DIAGNOSIS — M9904 Segmental and somatic dysfunction of sacral region: Secondary | ICD-10-CM | POA: Diagnosis not present

## 2022-01-21 DIAGNOSIS — M9903 Segmental and somatic dysfunction of lumbar region: Secondary | ICD-10-CM | POA: Diagnosis not present

## 2022-01-21 DIAGNOSIS — M9901 Segmental and somatic dysfunction of cervical region: Secondary | ICD-10-CM | POA: Diagnosis not present

## 2022-01-21 DIAGNOSIS — M9905 Segmental and somatic dysfunction of pelvic region: Secondary | ICD-10-CM | POA: Diagnosis not present

## 2022-01-22 MED ORDER — CEPHALEXIN 500 MG PO CAPS: 500.0000 mg | ORAL_CAPSULE | Freq: Two times a day (BID) | ORAL | 0 refills | Status: DC

## 2022-01-24 DIAGNOSIS — F411 Generalized anxiety disorder: Secondary | ICD-10-CM | POA: Diagnosis not present

## 2022-01-24 DIAGNOSIS — F324 Major depressive disorder, single episode, in partial remission: Secondary | ICD-10-CM | POA: Diagnosis not present

## 2022-01-26 ENCOUNTER — Encounter: Payer: Self-pay | Admitting: Family Medicine

## 2022-01-26 ENCOUNTER — Ambulatory Visit (INDEPENDENT_AMBULATORY_CARE_PROVIDER_SITE_OTHER): Payer: BC Managed Care – PPO | Admitting: Family Medicine

## 2022-01-26 VITALS — BP 122/84 | HR 72 | Temp 98.0°F | Ht 61.75 in | Wt 200.1 lb

## 2022-01-26 DIAGNOSIS — F419 Anxiety disorder, unspecified: Secondary | ICD-10-CM

## 2022-01-26 DIAGNOSIS — Z833 Family history of diabetes mellitus: Secondary | ICD-10-CM | POA: Insufficient documentation

## 2022-01-26 DIAGNOSIS — Z23 Encounter for immunization: Secondary | ICD-10-CM

## 2022-01-26 DIAGNOSIS — Z0000 Encounter for general adult medical examination without abnormal findings: Secondary | ICD-10-CM

## 2022-01-26 DIAGNOSIS — E669 Obesity, unspecified: Secondary | ICD-10-CM

## 2022-01-26 DIAGNOSIS — R1032 Left lower quadrant pain: Secondary | ICD-10-CM | POA: Insufficient documentation

## 2022-01-26 DIAGNOSIS — F32A Depression, unspecified: Secondary | ICD-10-CM

## 2022-01-26 DIAGNOSIS — R739 Hyperglycemia, unspecified: Secondary | ICD-10-CM | POA: Insufficient documentation

## 2022-01-26 DIAGNOSIS — L732 Hidradenitis suppurativa: Secondary | ICD-10-CM

## 2022-01-26 NOTE — Assessment & Plan Note (Signed)
Discussed how this problem influences overall health and the risks it imposes  Reviewed plan for weight loss with lower calorie diet (via better food choices and also portion control or program like weight watchers) and exercise building up to or more than 30 minutes 5 days per week including some aerobic activity   Wt gain noted Does exercise  Diet is not consistent due to emotional eating  Will continue to work on that with counselor  Recommend low glycemic diet

## 2022-01-26 NOTE — Assessment & Plan Note (Signed)
Recent spot in R groin healed without abx (after spontaneous drainage)  Enc to keep it clean and dry  She flares menstrually  Wonder if she would be candidate for continuous OC

## 2022-01-26 NOTE — Assessment & Plan Note (Signed)
Mother and brother have DM2 Father is prediabetic despite being thin

## 2022-01-26 NOTE — Assessment & Plan Note (Signed)
Reviewed health habits including diet and exercise and skin cancer prevention Reviewed appropriate screening tests for age  Also reviewed health mt list, fam hx and immunization status , as well as social and family history   See HPI Labs ordered  Gyn care utd from 12/2021 Pap utd -ascus with neg HPV Good exercise  Discussed optimal low glycemic diet  Flu shot given today  Discussed goals for wt loss

## 2022-01-26 NOTE — Patient Instructions (Addendum)
Ask your gyn if you would be a candidate for less frequent periods/ continuous oral contraceptive   Try to get most of your carbohydrates from produce (with the exception of white potatoes)  Eat less bread/pasta/rice/snack foods/cereals/sweets and other items from the middle of the grocery store (processed carbs)  Keep up the great exercise !   Flu shot today   Labs today     Consider a visit with Dr Patsy Lager for sport med if needed for leg issues (he is here)

## 2022-01-26 NOTE — Assessment & Plan Note (Signed)
This occurred after power lifting injury in April Now better but still bothers her occ ? If hip abductor injury  Nl rom today- reassuring exam Disc imp of stretching Consider f/u with sport med if this does not continue to improve

## 2022-01-26 NOTE — Assessment & Plan Note (Signed)
Doing fairly well  Continues wellbutrin sr 150 mg bid  No longer on zoloft  Continues counseling and psychiatric care   Enc good self care

## 2022-01-26 NOTE — Assessment & Plan Note (Signed)
Once outside office  Strong family h/o DM2 She is obese and high risk for DM  a1c added to labs today

## 2022-01-26 NOTE — Progress Notes (Signed)
Subjective:    Patient ID: Shawna Craig, female    DOB: July 24, 1985, 36 y.o.   MRN: [id number]  HPI Here for health maintenance exam and to review chronic medical problems    Wt Readings from Last 3 Encounters:  01/26/22 200 lb 2 oz (90.8 kg)  12/15/21 202 lb (91.6 kg)  12/02/21 203 lb 8 oz (92.3 kg)   36.90 kg/m  Had a pretty chill summer  Went to the [employer] have camps  [place] out with friends   Kids are 9 and 6    We treated a hydradanitis area with keflex (did not ever need to take it)  It opened and drained and feels better  This gets worse before her period    She did some power lifting in April   Had a pain in the L groin  There is a knot in the area   ? Hernia  R knee and ankle bother her a bit       Immunization History  Administered Date(s) Administered   Influenza, Seasonal, Injecte, Preservative Fre 01/27/2016   Influenza,inj,Quad PF,6+ Mos 04/03/2013, 02/28/2018, 03/10/2020   Influenza-Unspecified 02/14/2014, 01/16/2015, 01/27/2016, 02/14/2017, 03/21/2021   Moderna Sars-Covid-2 Vaccination 07/02/2019, 07/30/2019   Tdap 07/17/2015   Varicella 04/19/2014   Health Maintenance Due  Topic Date Due   Hepatitis C Screening  Never done   COVID-19 Vaccine (3 - Moderna series) 09/24/2019   INFLUENZA VACCINE  12/15/2021    Gyn care went on 8/1    Flu shot-given today   Pap 12/2021- ascus with neg HPV Sees Alecia Copland and will re check in a year   On OC - loestrin fe 1.5-30 Periods are pretty short   She gets worse skin problems and also yeast around period  Pms is worse than her actual period    Anxiety and depression - depends on the day --sees Dr Maryruth Bun  Up and down  Lost husband 6 y ago  Taking wellbutrin sr 150 mg bid -helps  Ssri in the past(zoloft) - she came off of it and did fairly well   Is in counseling also   Trying to take care of herself  Some exercise- works out with a Psychologist, educational - likes it   Consistency with diet is  really hard  Good for a while and then falls off  She eats emotionally -has always done this  Usually when down or anxious  Around period for sure   Wants to do blood work  Mom is diabetic  Father is prediabetic  and skinny   She checked her blood sugar one day - in the high 90s     Has gained some weight over the years and that is hard on her     Last labs in 2020  Lab Results  Component Value Date   CHOL 186 10/17/2018   HDL 74.40 10/17/2018   LDLCALC 97 10/17/2018   TRIG 74.0 10/17/2018   CHOLHDL 3 10/17/2018   Patient Active Problem List   Diagnosis Date Noted   Elevated blood sugar 01/26/2022   Family history of diabetes mellitus 01/26/2022   Left groin pain 01/26/2022   Dysplasia of cervix, low grade (CIN 1) 05/05/2020   Cervical high risk human papillomavirus (HPV) DNA test positive    Genital warts 01/16/2019   Vaginal burning 01/16/2019   Routine general medical examination at a health care facility 10/17/2018   Anxiety and depression 04/12/2016   Hidradenitis suppurativa 02/03/2016  Obesity (BMI 30-39.9) 01/27/2016   Labor and delivery indication for care or intervention 10/08/2015   PROM (premature rupture of membranes) 10/08/2015   Normal vaginal delivery 10/08/2015   Fatigue 07/15/2015   Past Medical History:  Diagnosis Date   Cervical high risk human papillomavirus (HPV) DNA test positive    Genital warts    Hidradenitis suppurativa    Vaccine for human papilloma virus (HPV) types 6, 11, 16, and 18 administered    Yeast vaginitis    Past Surgical History:  Procedure Laterality Date   TONSILLECTOMY AND ADENOIDECTOMY     2007   WISDOM TOOTH EXTRACTION     Social History   Tobacco Use   Smoking status: Never   Smokeless tobacco: Never  Vaping Use   Vaping Use: Never used  Substance Use Topics   Alcohol use: No    Alcohol/week: 0.0 standard drinks of alcohol   Drug use: No   Family History  Problem Relation Age of Onset   Breast  cancer Neg Hx    Ovarian cancer Neg Hx    No Known Allergies Current Outpatient Medications on File Prior to Visit  Medication Sig Dispense Refill   buPROPion (WELLBUTRIN SR) 150 MG 12 hr tablet Take 150 mg by mouth 2 (two) times daily.     norethindrone-ethinyl estradiol-iron (LOESTRIN FE) 1.5-30 MG-MCG tablet Take 1 tablet by mouth daily. 84 tablet 3   Probiotic Product (PROBIOTIC PO) Take 1 tablet by mouth daily.     No current facility-administered medications on file prior to visit.      Review of Systems  Constitutional:  Positive for appetite change. Negative for activity change, fatigue, fever and unexpected weight change.  HENT:  Negative for congestion, ear pain, rhinorrhea, sinus pressure and sore throat.   Eyes:  Negative for pain, redness and visual disturbance.  Respiratory:  Negative for cough, shortness of breath and wheezing.   Cardiovascular:  Negative for chest pain and palpitations.  Gastrointestinal:  Negative for abdominal pain, blood in stool, constipation and diarrhea.  Endocrine: Negative for polydipsia and polyuria.  Genitourinary:  Negative for dysuria, frequency and urgency.  Musculoskeletal:  Positive for arthralgias. Negative for gait problem and myalgias.  Skin:  Negative for pallor and rash.  Allergic/Immunologic: Negative for environmental allergies.  Neurological:  Negative for dizziness, syncope and headaches.  Hematological:  Negative for adenopathy. Does not bruise/bleed easily.  Psychiatric/Behavioral:  Negative for decreased concentration and dysphoric mood. The patient is not nervous/anxious.        Objective:   Physical Exam Constitutional:      General: She is not in acute distress.    Appearance: Normal appearance. She is well-developed. She is obese. She is not ill-appearing or diaphoretic.  HENT:     Head: Normocephalic and atraumatic.     Right Ear: Tympanic membrane, ear canal and external ear normal.     Left Ear: Tympanic  membrane, ear canal and external ear normal.     Nose: Nose normal. No congestion.     Mouth/Throat:     Mouth: Mucous membranes are moist.     Pharynx: Oropharynx is clear. No posterior oropharyngeal erythema.  Eyes:     General: No scleral icterus.    Extraocular Movements: Extraocular movements intact.     Conjunctiva/sclera: Conjunctivae normal.     Pupils: Pupils are equal, round, and reactive to light.  Neck:     Thyroid: No thyromegaly.     Vascular: No carotid bruit or  JVD.  Cardiovascular:     Rate and Rhythm: Normal rate and regular rhythm.     Pulses: Normal pulses.     Heart sounds: Normal heart sounds.     No gallop.  Pulmonary:     Effort: Pulmonary effort is normal. No respiratory distress.     Breath sounds: Normal breath sounds. No wheezing.     Comments: Good air exch Chest:     Chest wall: No tenderness.  Abdominal:     General: Bowel sounds are normal. There is no distension or abdominal bruit.     Palpations: Abdomen is soft. There is no mass.     Tenderness: There is no abdominal tenderness.     Hernia: No hernia is present.  Genitourinary:    Comments: Breast and pelvic exam done by gyn last mo  Musculoskeletal:        General: No tenderness. Normal range of motion.     Cervical back: Normal range of motion and neck supple. No rigidity. No muscular tenderness.     Right lower leg: No edema.     Left lower leg: No edema.     Comments: No kyphosis   Lymphadenopathy:     Cervical: No cervical adenopathy.  Skin:    General: Skin is warm and dry.     Coloration: Skin is not pale.     Findings: No erythema or rash.     Comments: Solar lentigines diffusely Mildly tanned   Neurological:     Mental Status: She is alert. Mental status is at baseline.     Cranial Nerves: No cranial nerve deficit.     Motor: No abnormal muscle tone.     Coordination: Coordination normal.     Gait: Gait normal.     Deep Tendon Reflexes: Reflexes are normal and symmetric.  Reflexes normal.  Psychiatric:        Mood and Affect: Mood normal.        Cognition and Memory: Cognition and memory normal.           Assessment & Plan:   Problem List Items Addressed This Visit       Musculoskeletal and Integument   Hidradenitis suppurativa    Recent spot in R groin healed without abx (after spontaneous drainage)  Enc to keep it clean and dry  She flares menstrually  Wonder if she would be candidate for continuous OC         Other   Anxiety and depression    Doing fairly well  Continues wellbutrin sr 150 mg bid  No longer on zoloft  Continues counseling and psychiatric care   Enc good self care        Elevated blood sugar    Once outside office  Strong family h/o DM2 She is obese and high risk for DM  a1c added to labs today      Relevant Orders   Hemoglobin A1c   Family history of diabetes mellitus    Mother and brother have DM2 Father is prediabetic despite being thin       Relevant Orders   Hemoglobin A1c   Left groin pain    This occurred after power lifting injury in April Now better but still bothers her occ ? If hip abductor injury  Nl rom today- reassuring exam Disc imp of stretching Consider f/u with sport med if this does not continue to improve       Obesity (BMI 30-39.9)  Discussed how this problem influences overall health and the risks it imposes  Reviewed plan for weight loss with lower calorie diet (via better food choices and also portion control or program like weight watchers) and exercise building up to or more than 30 minutes 5 days per week including some aerobic activity   Wt gain noted Does exercise  Diet is not consistent due to emotional eating  Will continue to work on that with counselor  Recommend low glycemic diet       Routine general medical examination at a health care facility - Primary    Reviewed health habits including diet and exercise and skin cancer prevention Reviewed appropriate  screening tests for age  Also reviewed health mt list, fam hx and immunization status , as well as social and family history   See HPI Labs ordered  Gyn care utd from 12/2021 Pap utd -ascus with neg HPV Good exercise  Discussed optimal low glycemic diet  Flu shot given today  Discussed goals for wt loss      Relevant Orders   TSH   Lipid panel   Comprehensive metabolic panel   CBC with Differential/Platelet   Other Visit Diagnoses     Need for influenza vaccination       Relevant Orders   Flu Vaccine QUAD 6+ mos PF IM (Fluarix Quad PF) (Completed)

## 2022-01-27 ENCOUNTER — Encounter: Payer: Self-pay | Admitting: Obstetrics and Gynecology

## 2022-01-27 ENCOUNTER — Encounter: Payer: Self-pay | Admitting: Family Medicine

## 2022-01-27 DIAGNOSIS — R7303 Prediabetes: Secondary | ICD-10-CM | POA: Insufficient documentation

## 2022-01-27 LAB — COMPREHENSIVE METABOLIC PANEL
ALT: 15 U/L (ref 0–35)
AST: 13 U/L (ref 0–37)
Albumin: 3.9 g/dL (ref 3.5–5.2)
Alkaline Phosphatase: 76 U/L (ref 39–117)
BUN: 15 mg/dL (ref 6–23)
CO2: 26 mEq/L (ref 19–32)
Calcium: 9.2 mg/dL (ref 8.4–10.5)
Chloride: 103 mEq/L (ref 96–112)
Creatinine, Ser: 0.88 mg/dL (ref 0.40–1.20)
GFR: 85.5 mL/min (ref >60.00)
Glucose, Bld: 88 mg/dL (ref 70–99)
Potassium: 4.5 mEq/L (ref 3.5–5.1)
Sodium: 138 mEq/L (ref 135–145)
Total Bilirubin: 0.3 mg/dL (ref 0.2–1.2)
Total Protein: 7 g/dL (ref 6.0–8.3)

## 2022-01-27 LAB — CBC WITH DIFFERENTIAL/PLATELET
Basophils Absolute: 0.1 10*3/uL (ref 0.0–0.1)
Basophils Relative: 1.1 % (ref 0.0–3.0)
Eosinophils Absolute: 0.1 10*3/uL (ref 0.0–0.7)
Eosinophils Relative: 1.2 % (ref 0.0–5.0)
HCT: 39.4 % (ref 36.0–46.0)
Hemoglobin: 13.1 g/dL (ref 12.0–15.0)
Lymphocytes Relative: 32.6 % (ref 12.0–46.0)
Lymphs Abs: 2.7 10*3/uL (ref 0.7–4.0)
MCHC: 33.2 g/dL (ref 30.0–36.0)
MCV: 88 fl (ref 78.0–100.0)
Monocytes Absolute: 0.6 10*3/uL (ref 0.1–1.0)
Monocytes Relative: 7.4 % (ref 3.0–12.0)
Neutro Abs: 4.7 10*3/uL (ref 1.4–7.7)
Neutrophils Relative %: 57.7 % (ref 43.0–77.0)
Platelets: 315 10*3/uL (ref 150.0–400.0)
RBC: 4.48 Mil/uL (ref 3.87–5.11)
RDW: 12.8 % (ref 11.5–15.5)
WBC: 8.2 10*3/uL (ref 4.0–10.5)

## 2022-01-27 LAB — LIPID PANEL
Cholesterol: 180 mg/dL (ref 0–200)
HDL: 57.5 mg/dL (ref >39.00)
LDL Cholesterol: 101 mg/dL — ABNORMAL HIGH (ref 0–99)
NonHDL: 122.73
Total CHOL/HDL Ratio: 3
Triglycerides: 107 mg/dL (ref 0.0–149.0)
VLDL: 21.4 mg/dL (ref 0.0–40.0)

## 2022-01-27 LAB — HEMOGLOBIN A1C: Hgb A1c MFr Bld: 5.7 % (ref 4.6–6.5)

## 2022-01-27 LAB — TSH: TSH: 1.79 u[IU]/mL (ref 0.35–5.50)

## 2022-02-02 DIAGNOSIS — F331 Major depressive disorder, recurrent, moderate: Secondary | ICD-10-CM | POA: Diagnosis not present

## 2022-02-02 DIAGNOSIS — F5105 Insomnia due to other mental disorder: Secondary | ICD-10-CM | POA: Diagnosis not present

## 2022-02-02 DIAGNOSIS — F411 Generalized anxiety disorder: Secondary | ICD-10-CM | POA: Diagnosis not present

## 2022-02-10 ENCOUNTER — Encounter: Payer: Self-pay | Admitting: Obstetrics and Gynecology

## 2022-02-14 DIAGNOSIS — F411 Generalized anxiety disorder: Secondary | ICD-10-CM | POA: Diagnosis not present

## 2022-02-14 DIAGNOSIS — F324 Major depressive disorder, single episode, in partial remission: Secondary | ICD-10-CM | POA: Diagnosis not present

## 2022-02-15 MED ORDER — FLUCONAZOLE 150 MG PO TABS: 150.0000 mg | ORAL_TABLET | Freq: Once | ORAL | 0 refills | Status: AC

## 2022-03-02 DIAGNOSIS — F411 Generalized anxiety disorder: Secondary | ICD-10-CM | POA: Diagnosis not present

## 2022-03-02 DIAGNOSIS — F324 Major depressive disorder, single episode, in partial remission: Secondary | ICD-10-CM | POA: Diagnosis not present

## 2022-03-09 DIAGNOSIS — F411 Generalized anxiety disorder: Secondary | ICD-10-CM | POA: Diagnosis not present

## 2022-03-09 DIAGNOSIS — F324 Major depressive disorder, single episode, in partial remission: Secondary | ICD-10-CM | POA: Diagnosis not present

## 2022-03-21 DIAGNOSIS — F324 Major depressive disorder, single episode, in partial remission: Secondary | ICD-10-CM | POA: Diagnosis not present

## 2022-03-21 DIAGNOSIS — F411 Generalized anxiety disorder: Secondary | ICD-10-CM | POA: Diagnosis not present

## 2022-04-04 DIAGNOSIS — F324 Major depressive disorder, single episode, in partial remission: Secondary | ICD-10-CM | POA: Diagnosis not present

## 2022-04-04 DIAGNOSIS — F411 Generalized anxiety disorder: Secondary | ICD-10-CM | POA: Diagnosis not present

## 2022-04-18 DIAGNOSIS — F324 Major depressive disorder, single episode, in partial remission: Secondary | ICD-10-CM | POA: Diagnosis not present

## 2022-04-18 DIAGNOSIS — F411 Generalized anxiety disorder: Secondary | ICD-10-CM | POA: Diagnosis not present

## 2022-05-03 ENCOUNTER — Ambulatory Visit
Admission: EM | Admit: 2022-05-03 | Discharge: 2022-05-03 | Disposition: A | Discharge status: Home / Self Care | Payer: BC Managed Care – PPO | Attending: Urgent Care | Admitting: Urgent Care

## 2022-05-03 DIAGNOSIS — R509 Fever, unspecified: Secondary | ICD-10-CM | POA: Diagnosis not present

## 2022-05-03 DIAGNOSIS — U071 COVID-19: Secondary | ICD-10-CM | POA: Diagnosis not present

## 2022-05-03 DIAGNOSIS — R6889 Other general symptoms and signs: Secondary | ICD-10-CM | POA: Diagnosis not present

## 2022-05-03 DIAGNOSIS — R059 Cough, unspecified: Secondary | ICD-10-CM | POA: Diagnosis not present

## 2022-05-03 LAB — RESP PANEL BY RT-PCR (FLU A&B, COVID) ARPGX2
Influenza A by PCR: NEGATIVE
Influenza B by PCR: NEGATIVE
SARS Coronavirus 2 by RT PCR: POSITIVE — AB

## 2022-05-03 MED ORDER — OSELTAMIVIR PHOSPHATE 75 MG PO CAPS: 75.0000 mg | ORAL_CAPSULE | Freq: Two times a day (BID) | ORAL | 0 refills | Status: DC

## 2022-05-03 NOTE — ED Provider Notes (Signed)
Renaldo Fiddler    CSN: [id number] Arrival date & time: 05/03/22  1408      History   Chief Complaint No chief complaint on file.   HPI Shawna Craig is a 36 y.o. female.   HPI  Presents to UC with complaint of cough, nasal congestion, body aches, chills, fever x 2 days.  Patient started with cough 2 days ago.  Fever starting last night.  Past Medical History:  Diagnosis Date   Cervical high risk human papillomavirus (HPV) DNA test positive    Genital warts    Hidradenitis suppurativa    Vaccine for human papilloma virus (HPV) types 6, 11, 16, and 18 administered    Yeast vaginitis     Patient Active Problem List   Diagnosis Date Noted   Prediabetes 01/27/2022   Family history of diabetes mellitus 01/26/2022   Left groin pain 01/26/2022   Dysplasia of cervix, low grade (CIN 1) 05/05/2020   Cervical high risk human papillomavirus (HPV) DNA test positive    Genital warts 01/16/2019   Vaginal burning 01/16/2019   Routine general medical examination at a health care facility 10/17/2018   Anxiety and depression 04/12/2016   Hidradenitis suppurativa 02/03/2016   Obesity (BMI 30-39.9) 01/27/2016   Labor and delivery indication for care or intervention 10/08/2015   PROM (premature rupture of membranes) 10/08/2015   Normal vaginal delivery 10/08/2015   Fatigue 07/15/2015    Past Surgical History:  Procedure Laterality Date   TONSILLECTOMY AND ADENOIDECTOMY     2007   WISDOM TOOTH EXTRACTION      OB History     Gravida  2   Para  2   Term  2   Preterm  0   AB  0   Living  2      SAB  0   IAB  0   Ectopic  0   Multiple  0   Live Births  2            Home Medications    Prior to Admission medications   Medication Sig Start Date End Date Taking? Authorizing Provider  buPROPion (WELLBUTRIN SR) 150 MG 12 hr tablet Take 150 mg by mouth 2 (two) times daily. 11/16/21   [provider]  norethindrone-ethinyl estradiol-iron  (LOESTRIN FE) 1.5-30 MG-MCG tablet Take 1 tablet by mouth daily. 12/15/21   Copland, Ilona Sorrel, PA-C  Probiotic Product (PROBIOTIC PO) Take 1 tablet by mouth daily.    [provider]    Family History Family History  Problem Relation Age of Onset   Breast cancer Neg Hx    Ovarian cancer Neg Hx     Social History Social History   Tobacco Use   Smoking status: Never   Smokeless tobacco: Never  Vaping Use   Vaping Use: Never used  Substance Use Topics   Alcohol use: No    Alcohol/week: 0.0 standard drinks of alcohol   Drug use: No     Allergies   Patient has no known allergies.   Review of Systems Review of Systems   Physical Exam Triage Vital Signs ED Triage Vitals  Enc Vitals Group     BP      Pulse      Resp      Temp      Temp src      SpO2      Weight      Height      Head Circumference  Peak Flow      Pain Score      Pain Loc      Pain Edu?      Excl. in GC?    No data found.  Updated Vital Signs There were no vitals taken for this visit.  Visual Acuity Right Eye Distance:   Left Eye Distance:   Bilateral Distance:    Right Eye Near:   Left Eye Near:    Bilateral Near:     Physical Exam Vitals reviewed.  Constitutional:      Appearance: She is ill-appearing.  Cardiovascular:     Rate and Rhythm: Normal rate and regular rhythm.     Pulses: Normal pulses.     Heart sounds: Normal heart sounds.  Pulmonary:     Effort: Pulmonary effort is normal.  Skin:    General: Skin is warm and dry.  Neurological:     General: No focal deficit present.     Mental Status: She is alert and oriented to person, place, and time.  Psychiatric:        Mood and Affect: Mood normal.      UC Treatments / Results  Labs (all labs ordered are listed, but only abnormal results are displayed) Labs Reviewed - No data to display  EKG   Radiology No results found.  Procedures Procedures (including critical care time)  Medications  Ordered in UC Medications - No data to display  Initial Impression / Assessment and Plan / UC Course  I have reviewed the triage vital signs and the nursing notes.  Pertinent labs & imaging results that were available during my care of the patient were reviewed by me and considered in my medical decision making (see chart for details).   Patient is afebrile here without recent antipyretics. Satting well on room air. Overall is well appearing, well hydrated, without respiratory distress. Pulmonary exam is unremarkable.  Lungs CTAB without wheezes, rhonchi, rales.  Likely viral pathogen including influenza.  Treating presumptively for influenza A with Tamiflu while awaiting results of respiratory swab.  Recommending use of OTC medication for control of other symptoms.  Final Clinical Impressions(s) / UC Diagnoses   Final diagnoses:  None   Discharge Instructions   None    ED Prescriptions   None    PDMP not reviewed this encounter.   Charma Igo, [place] 05/03/22 1601

## 2022-05-03 NOTE — Discharge Instructions (Signed)
You have been diagnosed with a viral upper respiratory infection based on your symptoms and exam. Viral illnesses cannot be treated with antibiotics - they are self limiting - and you should find your symptoms resolving within a few days. Get plenty of rest and non-caffeinated fluids.  We have performed a respiratory swab checking for COVID, and influenza. I have prescribed Tamiflu, antiviral therapy for influenza A, based on a presumptive diagnosis of influenza.  Someone will contact you after results of your swab are available with instructions to continue or stop this medication.  We recommend you use over-the-counter medications for symptom control including Tylenol or ibuprofen for fever, chills or body aches, and cold/cough medication.  Saline mist spray is helpful for removing excess mucus from your nose.  Room humidifiers are helpful to ease breathing at night. You might also find relief of nasal/sinus congestion symptoms by using a nasal decongestant such as Sudafed sinus (pseudoephedrine).  You will need to obtain this medication from behind the pharmacist counter.  Speak to the pharmacist to verify that you are not duplicating medications with other over-the-counter formulations that you may be using.   Follow up here or with your primary care provider if your symptoms are worsening or not improving.    

## 2022-05-03 NOTE — ED Triage Notes (Signed)
Pt. Presents to UC w/ c/o a cough, congestion, chills and body aches for the past 2 days.

## 2022-05-06 ENCOUNTER — Encounter: Payer: Self-pay | Admitting: Family Medicine

## 2022-05-06 MED ORDER — PREDNISONE 10 MG PO TABS: ORAL_TABLET | ORAL | 0 refills | Status: DC

## 2022-05-12 DIAGNOSIS — F411 Generalized anxiety disorder: Secondary | ICD-10-CM | POA: Diagnosis not present

## 2022-05-12 DIAGNOSIS — F324 Major depressive disorder, single episode, in partial remission: Secondary | ICD-10-CM | POA: Diagnosis not present

## 2022-05-31 DIAGNOSIS — F331 Major depressive disorder, recurrent, moderate: Secondary | ICD-10-CM | POA: Diagnosis not present

## 2022-05-31 DIAGNOSIS — F411 Generalized anxiety disorder: Secondary | ICD-10-CM | POA: Diagnosis not present

## 2022-06-06 DIAGNOSIS — F324 Major depressive disorder, single episode, in partial remission: Secondary | ICD-10-CM | POA: Diagnosis not present

## 2022-06-06 DIAGNOSIS — F411 Generalized anxiety disorder: Secondary | ICD-10-CM | POA: Diagnosis not present

## 2022-06-18 ENCOUNTER — Encounter: Payer: Self-pay | Admitting: Family Medicine

## 2022-06-18 ENCOUNTER — Encounter: Payer: Self-pay | Admitting: *Deleted

## 2022-06-18 ENCOUNTER — Ambulatory Visit: Payer: BC Managed Care – PPO | Admitting: Family Medicine

## 2022-06-18 VITALS — BP 108/72 | HR 94 | Temp 98.0°F | Ht 61.75 in | Wt 192.5 lb

## 2022-06-18 DIAGNOSIS — M6740 Ganglion, unspecified site: Secondary | ICD-10-CM | POA: Insufficient documentation

## 2022-06-18 NOTE — Progress Notes (Signed)
Subjective:    Patient ID: Shawna Craig, female    DOB: 1986/03/24, 37 y.o.   MRN: [id number]  HPI Pt presents for cyst on wrist   Wt Readings from Last 3 Encounters:  06/18/22 192 lb 8 oz (87.3 kg)  01/26/22 200 lb 2 oz (90.8 kg)  12/15/21 202 lb (91.6 kg)   35.49 kg/m  Lump on lateral wrist  Just noticed it   ? Ganglion cyst  It does not hurt  It gets in the way when she moves  Can feel pinching when she flexes wrist   No past trauma   Patient Active Problem List   Diagnosis Date Noted   Ganglion cyst 06/18/2022   Prediabetes 01/27/2022   Family history of diabetes mellitus 01/26/2022   Left groin pain 01/26/2022   Dysplasia of cervix, low grade (CIN 1) 05/05/2020   Cervical high risk human papillomavirus (HPV) DNA test positive    Genital warts 01/16/2019   Vaginal burning 01/16/2019   Routine general medical examination at a health care facility 10/17/2018   Anxiety and depression 04/12/2016   Hidradenitis suppurativa 02/03/2016   Obesity (BMI 30-39.9) 01/27/2016   Labor and delivery indication for care or intervention 10/08/2015   PROM (premature rupture of membranes) 10/08/2015   Normal vaginal delivery 10/08/2015   Fatigue 07/15/2015   Past Medical History:  Diagnosis Date   Cervical high risk human papillomavirus (HPV) DNA test positive    Genital warts    Hidradenitis suppurativa    Vaccine for human papilloma virus (HPV) types 6, 11, 16, and 18 administered    Yeast vaginitis    Past Surgical History:  Procedure Laterality Date   TONSILLECTOMY AND ADENOIDECTOMY     2007   WISDOM TOOTH EXTRACTION     Social History   Tobacco Use   Smoking status: Never   Smokeless tobacco: Never  Vaping Use   Vaping Use: Never used  Substance Use Topics   Alcohol use: No    Alcohol/week: 0.0 standard drinks of alcohol   Drug use: No   Family History  Problem Relation Age of Onset   Breast cancer Neg Hx    Ovarian cancer Neg Hx    No Known  Allergies Current Outpatient Medications on File Prior to Visit  Medication Sig Dispense Refill   buPROPion (WELLBUTRIN SR) 150 MG 12 hr tablet Take 150 mg by mouth 2 (two) times daily.     norethindrone-ethinyl estradiol-iron (LOESTRIN FE) 1.5-30 MG-MCG tablet Take 1 tablet by mouth daily. 84 tablet 3   Probiotic Product (PROBIOTIC PO) Take 1 tablet by mouth daily.     No current facility-administered medications on file prior to visit.    Review of Systems  Constitutional:  Negative for activity change, appetite change, fatigue, fever and unexpected weight change.  HENT:  Negative for congestion, ear pain, rhinorrhea, sinus pressure and sore throat.   Eyes:  Negative for pain, redness and visual disturbance.  Respiratory:  Negative for cough, shortness of breath and wheezing.   Cardiovascular:  Negative for chest pain and palpitations.  Gastrointestinal:  Negative for abdominal pain, blood in stool, constipation and diarrhea.  Endocrine: Negative for polydipsia and polyuria.  Genitourinary:  Negative for dysuria, frequency and urgency.  Musculoskeletal:  Negative for arthralgias, back pain and myalgias.  Skin:  Negative for pallor and rash.  Allergic/Immunologic: Negative for environmental allergies.  Neurological:  Negative for dizziness, syncope and headaches.  Hematological:  Negative for adenopathy. Does not  bruise/bleed easily.  Psychiatric/Behavioral:  Negative for decreased concentration and dysphoric mood. The patient is not nervous/anxious.        Objective:   Physical Exam Constitutional:      General: She is not in acute distress.    Appearance: Normal appearance. She is obese. She is not ill-appearing or diaphoretic.  Eyes:     Conjunctiva/sclera: Conjunctivae normal.     Pupils: Pupils are equal, round, and reactive to light.  Musculoskeletal:     Cervical back: Neck supple.     Comments: 0.5 cm mobile rubbery/firm round mass on R lateral volar wrist  Consistent  with ganglion cyst  Non tender  Full rom of wrist- cyst it bothersome with full flexion   Nl perfusion/sensation of wrist and hand No swelling   Lymphadenopathy:     Cervical: No cervical adenopathy.  Skin:    General: Skin is warm and dry.     Coloration: Skin is not pale.     Findings: No bruising, erythema or rash.  Neurological:     Sensory: No sensory deficit.     Motor: No weakness.  Psychiatric:        Mood and Affect: Mood normal.           Assessment & Plan:   Problem List Items Addressed This Visit       Other   Ganglion cyst - Primary    Small cyst on lateral R volar wrist Becoming bothersome with movement  No s/s of infection or nerve impingement  Would like it treated or removed   Referral made to ortho/hand specialist to eval and tx Disc options   Will update in meantime if symptoms change or worsen       Relevant Orders   Ambulatory referral to Orthopedic Surgery

## 2022-06-18 NOTE — Assessment & Plan Note (Signed)
Small cyst on lateral R volar wrist Becoming bothersome with movement  No s/s of infection or nerve impingement  Would like it treated or removed   Referral made to ortho/hand specialist to eval and tx Disc options   Will update in meantime if symptoms change or worsen

## 2022-06-18 NOTE — Patient Instructions (Signed)
I placed a referral to a hand specialist for the cyst  If you don't get a call in 1-2 weeks let us know   If worse/pain or new symptoms please let us know

## 2022-06-20 DIAGNOSIS — F411 Generalized anxiety disorder: Secondary | ICD-10-CM | POA: Diagnosis not present

## 2022-06-20 DIAGNOSIS — F324 Major depressive disorder, single episode, in partial remission: Secondary | ICD-10-CM | POA: Diagnosis not present

## 2022-06-25 DIAGNOSIS — M67431 Ganglion, right wrist: Secondary | ICD-10-CM | POA: Diagnosis not present

## 2022-06-28 ENCOUNTER — Encounter: Payer: Self-pay | Admitting: Family Medicine

## 2022-06-28 MED ORDER — PREDNISONE 20 MG PO TABS: 20.0000 mg | ORAL_TABLET | Freq: Every day | ORAL | 0 refills | Status: DC

## 2022-06-28 MED ORDER — FLUTICASONE PROPIONATE 50 MCG/ACT NA SUSP: 2.0000 | Freq: Every day | NASAL | 6 refills | Status: DC

## 2022-07-04 DIAGNOSIS — F324 Major depressive disorder, single episode, in partial remission: Secondary | ICD-10-CM | POA: Diagnosis not present

## 2022-07-04 DIAGNOSIS — F411 Generalized anxiety disorder: Secondary | ICD-10-CM | POA: Diagnosis not present

## 2022-07-18 DIAGNOSIS — F411 Generalized anxiety disorder: Secondary | ICD-10-CM | POA: Diagnosis not present

## 2022-07-18 DIAGNOSIS — F324 Major depressive disorder, single episode, in partial remission: Secondary | ICD-10-CM | POA: Diagnosis not present

## 2022-07-29 ENCOUNTER — Ambulatory Visit: Payer: BC Managed Care – PPO | Admitting: Family Medicine

## 2022-08-01 DIAGNOSIS — F411 Generalized anxiety disorder: Secondary | ICD-10-CM | POA: Diagnosis not present

## 2022-08-01 DIAGNOSIS — F324 Major depressive disorder, single episode, in partial remission: Secondary | ICD-10-CM | POA: Diagnosis not present

## 2022-08-12 ENCOUNTER — Ambulatory Visit: Payer: BC Managed Care – PPO | Admitting: Family Medicine

## 2022-08-20 ENCOUNTER — Encounter: Payer: Self-pay | Admitting: Family Medicine

## 2022-08-20 ENCOUNTER — Ambulatory Visit (INDEPENDENT_AMBULATORY_CARE_PROVIDER_SITE_OTHER): Payer: BC Managed Care – PPO | Admitting: Family Medicine

## 2022-08-20 VITALS — BP 124/72 | HR 82 | Temp 97.9°F | Ht 61.75 in | Wt 192.2 lb

## 2022-08-20 DIAGNOSIS — R7303 Prediabetes: Secondary | ICD-10-CM | POA: Diagnosis not present

## 2022-08-20 DIAGNOSIS — F32A Depression, unspecified: Secondary | ICD-10-CM | POA: Diagnosis not present

## 2022-08-20 DIAGNOSIS — F419 Anxiety disorder, unspecified: Secondary | ICD-10-CM

## 2022-08-20 DIAGNOSIS — Z6835 Body mass index (BMI) 35.0-35.9, adult: Secondary | ICD-10-CM | POA: Diagnosis not present

## 2022-08-20 LAB — POCT GLYCOSYLATED HEMOGLOBIN (HGB A1C): Hemoglobin A1C: 5.2 % (ref 4.0–5.6)

## 2022-08-20 NOTE — Progress Notes (Unsigned)
Subjective:    Patient ID: Shawna Craig, female    DOB: 1985-09-04, 37 y.o.   MRN: [id number]  HPI Pt presents for f/u of prediabetes and chronic health problems  Obesity   Wt Readings from Last 3 Encounters:  08/20/22 192 lb 4 oz (87.2 kg)  06/18/22 192 lb 8 oz (87.3 kg)  01/26/22 200 lb 2 oz (90.8 kg)   35.45 kg/m Vitals:   08/20/22 0755  BP: 124/72  Pulse: 82  Temp: 97.9 F (36.6 C)  SpO2: 100%   Went to [place] for spring break   Feeling pretty good overall   Trying hard to get her weight down Lost a little bit and unsure why it is so slow  Feet hurt Less energy  Hates the way she looks     Was down to 184 at holidays- ate normally then   Working out  Counting calories    She strength trains regularly  Plans to go back to personal training  Weight trains 30-45 minutes 3 days per week  Has elliptical machine and likes to walk also  30 min during the week Longer on the weekend   Feels challenges by that   Diet  Uses myfitness pal -- when she counts calories she feels very restricted  1600 calories   Also weight watchers   Could make healthier choices  Eating out / convenience eating is downfall   Has tried intermittent fasting  Used an app  Thinks she did 16:8  Was psycholigically hard   Doing well mental health wise overall lately  Some anxiety  - a little worse    Whole family has weight problems  Worried about diabetes   Emotional eating is a problem She has discussed this with her counselor    Had issues with nasal congestion after covid Tx with prednisone   Prediabetes Lab Results  Component Value Date   HGBA1C 5.7 01/26/2022   Lipids  Lab Results  Component Value Date   CHOL 180 01/26/2022   HDL 57.50 01/26/2022   LDLCALC 101 (H) 01/26/2022   TRIG 107.0 01/26/2022   CHOLHDL 3 01/26/2022     Depression /anxiety = doing well   Wellbutrin is still working well at 150 bid   Patient Active Problem List    Diagnosis Date Noted   Ganglion cyst 06/18/2022   Prediabetes 01/27/2022   Family history of diabetes mellitus 01/26/2022   Left groin pain 01/26/2022   Dysplasia of cervix, low grade (CIN 1) 05/05/2020   Cervical high risk human papillomavirus (HPV) DNA test positive    Genital warts 01/16/2019   Vaginal burning 01/16/2019   Routine general medical examination at a health care facility 10/17/2018   Anxiety and depression 04/12/2016   Hidradenitis suppurativa 02/03/2016   BMI 35.0-35.9,adult 01/27/2016   Labor and delivery indication for care or intervention 10/08/2015   PROM (premature rupture of membranes) 10/08/2015   Normal vaginal delivery 10/08/2015   Fatigue 07/15/2015   Past Medical History:  Diagnosis Date   Cervical high risk human papillomavirus (HPV) DNA test positive    Genital warts    Hidradenitis suppurativa    Vaccine for human papilloma virus (HPV) types 6, 11, 16, and 18 administered    Yeast vaginitis    Past Surgical History:  Procedure Laterality Date   TONSILLECTOMY AND ADENOIDECTOMY     2007   WISDOM TOOTH EXTRACTION     Social History   Tobacco Use   Smoking  status: Never   Smokeless tobacco: Never  Vaping Use   Vaping Use: Never used  Substance Use Topics   Alcohol use: No    Alcohol/week: 0.0 standard drinks of alcohol   Drug use: No   Family History  Problem Relation Age of Onset   Breast cancer Neg Hx    Ovarian cancer Neg Hx    No Known Allergies Current Outpatient Medications on File Prior to Visit  Medication Sig Dispense Refill   buPROPion (WELLBUTRIN SR) 150 MG 12 hr tablet Take 150 mg by mouth 2 (two) times daily.     fluticasone (FLONASE) 50 MCG/ACT nasal spray Place 2 sprays into both nostrils daily. 16 g 6   norethindrone-ethinyl estradiol-iron (LOESTRIN FE) 1.5-30 MG-MCG tablet Take 1 tablet by mouth daily. 84 tablet 3   Probiotic Product (PROBIOTIC PO) Take 1 tablet by mouth daily.     No current facility-administered  medications on file prior to visit.      Review of Systems  Constitutional:  Positive for fatigue and unexpected weight change. Negative for activity change, appetite change and fever.  HENT:  Negative for congestion, ear pain, rhinorrhea, sinus pressure and sore throat.   Eyes:  Negative for pain, redness and visual disturbance.  Respiratory:  Negative for cough, shortness of breath and wheezing.   Cardiovascular:  Negative for chest pain and palpitations.  Gastrointestinal:  Negative for abdominal pain, blood in stool, constipation and diarrhea.  Endocrine: Negative for polydipsia and polyuria.  Genitourinary:  Negative for dysuria, frequency and urgency.  Musculoskeletal:  Negative for arthralgias, back pain and myalgias.  Skin:  Negative for pallor and rash.  Allergic/Immunologic: Negative for environmental allergies.  Neurological:  Negative for dizziness, syncope and headaches.  Hematological:  Negative for adenopathy. Does not bruise/bleed easily.  Psychiatric/Behavioral:  Negative for decreased concentration and dysphoric mood. The patient is not nervous/anxious.        Objective:   Physical Exam Constitutional:      General: She is not in acute distress.    Appearance: Normal appearance. She is well-developed. She is obese. She is not ill-appearing or diaphoretic.  HENT:     Head: Normocephalic and atraumatic.  Eyes:     General: No scleral icterus.    Conjunctiva/sclera: Conjunctivae normal.     Pupils: Pupils are equal, round, and reactive to light.  Neck:     Thyroid: No thyromegaly.     Vascular: No carotid bruit or JVD.  Cardiovascular:     Rate and Rhythm: Normal rate and regular rhythm.     Heart sounds: Normal heart sounds.     No gallop.  Pulmonary:     Effort: Pulmonary effort is normal. No respiratory distress.     Breath sounds: Normal breath sounds. No wheezing or rales.  Abdominal:     General: There is no distension or abdominal bruit.      Palpations: Abdomen is soft.  Musculoskeletal:     Cervical back: Normal range of motion and neck supple.     Right lower leg: No edema.     Left lower leg: No edema.  Lymphadenopathy:     Cervical: No cervical adenopathy.  Skin:    General: Skin is warm and dry.     Coloration: Skin is not pale.     Findings: No rash.  Neurological:     Mental Status: She is alert.     Coordination: Coordination normal.     Deep Tendon Reflexes: Reflexes are  normal and symmetric. Reflexes normal.  Psychiatric:        Mood and Affect: Mood normal.           Assessment & Plan:   Problem List Items Addressed This Visit       Other   Anxiety and depression    Continues to do well with psychiatric care and wellbutrin  Reviewed stressors/ coping techniques/symptoms/ support sources/ tx options and side effects in detail today Continues to be motivated        BMI 35.0-35.9,adult - Primary    Pt is having difficulty losing weight  Enc her to keep adding more strength training  Since calorie counting makes her crave more , she may do better with better choices without tracking Also discussed emotional eating- recommend she check out the [employer] program on line   Rev low glycemic eating  Eating less for convenience and less processed foods may elp   Ref to healthy [facility] wellness center as well       Relevant Orders   Amb Ref to Medical Weight Management   Prediabetes    Lab Results  Component Value Date   HGBA1C 5.2 08/20/2022  This is down from 5.7- great!  disc imp of low glycemic diet and wt loss to prevent DM2       Relevant Orders   POCT glycosylated hemoglobin (Hb A1C) (Completed)   Amb Ref to Medical Weight Management

## 2022-08-20 NOTE — Patient Instructions (Addendum)
Try to get most of your carbohydrates from produce (with the exception of white potatoes)  Eat less bread/pasta/rice/snack foods/cereals/sweets and other items from the middle of the grocery store (processed carbs)  Try ant make healthier food choices Eat less junk/convenience foods   Check out the [facility] program on line    I will place a referral to the healthy weight and wellness center   Keep up the cardio and strength training   Let's check an A1c on the way out

## 2022-08-22 DIAGNOSIS — F324 Major depressive disorder, single episode, in partial remission: Secondary | ICD-10-CM | POA: Diagnosis not present

## 2022-08-22 DIAGNOSIS — F411 Generalized anxiety disorder: Secondary | ICD-10-CM | POA: Diagnosis not present

## 2022-08-22 NOTE — Assessment & Plan Note (Signed)
Continues to do well with psychiatric care and wellbutrin  Reviewed stressors/ coping techniques/symptoms/ support sources/ tx options and side effects in detail today Continues to be motivated

## 2022-08-22 NOTE — Assessment & Plan Note (Signed)
Pt is having difficulty losing weight  Enc her to keep adding more strength training  Since calorie counting makes her crave more , she may do better with better choices without tracking Also discussed emotional eating- recommend she check out the [employer] program on line   Rev low glycemic eating  Eating less for convenience and less processed foods may elp   Ref to healthy weight and wellness center as well

## 2022-08-22 NOTE — Assessment & Plan Note (Addendum)
Lab Results  Component Value Date   HGBA1C 5.2 08/20/2022  This is down from 5.7- great!  disc imp of low glycemic diet and wt loss to prevent DM2

## 2022-08-24 ENCOUNTER — Encounter: Payer: Self-pay | Admitting: Obstetrics and Gynecology

## 2022-08-24 ENCOUNTER — Other Ambulatory Visit: Payer: Self-pay | Admitting: Obstetrics and Gynecology

## 2022-08-24 MED ORDER — FLUCONAZOLE 150 MG PO TABS: 150.0000 mg | ORAL_TABLET | Freq: Once | ORAL | 0 refills | Status: DC

## 2022-08-24 NOTE — Progress Notes (Signed)
Rx diflucan for yeast vag sx.  

## 2022-08-31 ENCOUNTER — Ambulatory Visit: Payer: BC Managed Care – PPO | Admitting: Nurse Practitioner

## 2022-08-31 ENCOUNTER — Encounter: Payer: Self-pay | Admitting: Nurse Practitioner

## 2022-08-31 VITALS — BP 113/79 | HR 90 | Temp 98.3°F | Ht 62.0 in | Wt 186.0 lb

## 2022-08-31 DIAGNOSIS — E669 Obesity, unspecified: Secondary | ICD-10-CM | POA: Diagnosis not present

## 2022-08-31 DIAGNOSIS — R7303 Prediabetes: Secondary | ICD-10-CM

## 2022-08-31 DIAGNOSIS — Z6834 Body mass index (BMI) 34.0-34.9, adult: Secondary | ICD-10-CM

## 2022-08-31 DIAGNOSIS — Z0289 Encounter for other administrative examinations: Secondary | ICD-10-CM

## 2022-08-31 NOTE — Progress Notes (Signed)
Office: [phone]  /  Fax: [phone]   Initial Visit  Shawna Craig was seen in clinic today to evaluate for obesity. She is interested in losing weight to improve overall health and reduce the risk of weight related complications. She presents today to review program treatment options, initial physical assessment, and evaluation.     She was referred by: PCP  When asked what else they would like to accomplish? She states: Improve quality of life and Lose a target amount of weight : goal weight:  145 lbs  Weight history:  She has always been aware of her weight as long as she can remember.  Her husband passed away in 2015-09-26 and she started gaining after he passed away    When asked how has your weight affected you? She states: Has affected self-esteem, Relationships, Contributed to medical problems, Contributed to orthopedic problems or mobility issues, Having fatigue, Having poor endurance, Problems with eating patterns, and Problems with depression and or anxiety  Some associated conditions: Hidradenitis suppurativa, anxiety, depression, pre diabetes  Contributing factors: Family history, stress  Weight promoting medications identified: None  Current nutrition plan: None  Current level of physical activity: working with a person trainer-cardio and weights.   Current or previous pharmacotherapy: None  Response to medication: Never tried medications   Past medical history includes:   Past Medical History:  Diagnosis Date   Cervical high risk human papillomavirus (HPV) DNA test positive    Genital warts    Hidradenitis suppurativa    Vaccine for human papilloma virus (HPV) types 6, 11, 16, and 18 administered    Yeast vaginitis      Objective:   BP 113/79   Pulse 90   Temp 98.3 F (36.8 C)   Ht  (1.575 m)   Wt 186 lb (84.4 kg)   LMP 07/30/2022 (Approximate)   SpO2 100%   BMI 34.02 kg/m  She was weighed on the bioimpedance scale: Body mass index is  34.02 kg/m.  Peak Weight:208 lbs , Body Fat%:39.3, Visceral Fat Rating:8, Weight trend over the last 12 months: Fluctuated   General:  Alert, oriented and cooperative. Patient is in no acute distress.  Respiratory: Normal respiratory effort, no problems with respiration noted   Gait: able to ambulate independently  Mental Status: Normal mood and affect. Normal behavior. Normal judgment and thought content.   DIAGNOSTIC DATA REVIEWED:  BMET    Component Value Date/Time   NA 138 01/26/2022 1559   K 4.5 01/26/2022 1559   CL 103 01/26/2022 1559   CO2 26 01/26/2022 1559   GLUCOSE 88 01/26/2022 1559   BUN 15 01/26/2022 1559   CREATININE 0.88 01/26/2022 1559   CALCIUM 9.2 01/26/2022 1559   Lab Results  Component Value Date   HGBA1C 5.2 08/20/2022   HGBA1C 5.7 01/26/2022   No results found for: "INSULIN" CBC    Component Value Date/Time   WBC 8.2 01/26/2022 1559   RBC 4.48 01/26/2022 1559   HGB 13.1 01/26/2022 1559   HGB 12.0 07/15/2015 0903   HCT 39.4 01/26/2022 1559   HCT 35.2 07/15/2015 0903   PLT 315.0 01/26/2022 1559   PLT 216 07/15/2015 0903   MCV 88.0 01/26/2022 1559   MCV 90 07/15/2015 0903   MCH 29.2 10/09/2015 0551   MCHC 33.2 01/26/2022 1559   RDW 12.8 01/26/2022 1559   RDW 13.0 07/15/2015 0903   Iron/TIBC/Ferritin/ %Sat    Component Value Date/Time   FERRITIN 34 07/15/2015 0903  Lipid Panel     Component Value Date/Time   CHOL 180 01/26/2022 1559   TRIG 107.0 01/26/2022 1559   HDL 57.50 01/26/2022 1559   CHOLHDL 3 01/26/2022 1559   VLDL 21.4 01/26/2022 1559   LDLCALC 101 (H) 01/26/2022 1559   Hepatic Function Panel     Component Value Date/Time   PROT 7.0 01/26/2022 1559   ALBUMIN 3.9 01/26/2022 1559   AST 13 01/26/2022 1559   ALT 15 01/26/2022 1559   ALKPHOS 76 01/26/2022 1559   BILITOT 0.3 01/26/2022 1559      Component Value Date/Time   TSH 1.79 01/26/2022 1559     Assessment and Plan:   Prediabetes Last A1c looked better.  Will  continue to monitor.    Generalized obesity  BMI 34.0-34.9,adult        Obesity Treatment / Action Plan:  Patient will work on garnering support from family and friends to begin weight loss journey. Will work on eliminating or reducing the presence of highly palatable, calorie dense foods in the home. Will complete provided nutritional and psychosocial assessment questionnaire before the next appointment. Will be scheduled for indirect calorimetry to determine resting energy expenditure in a fasting state.  This will allow [place] to create a reduced calorie, high-protein meal plan to promote loss of fat mass while preserving muscle mass.  Obesity Education Performed Today:  She was weighed on the bioimpedance scale and results were discussed and documented in the synopsis.  We discussed obesity as a disease and the importance of a more detailed evaluation of all the factors contributing to the disease.  We discussed the importance of long term lifestyle changes which include nutrition, exercise and behavioral modifications as well as the importance of customizing this to her specific health and social needs.  We discussed the benefits of reaching a healthier weight to alleviate the symptoms of existing conditions and reduce the risks of the biomechanical, metabolic and psychological effects of obesity.  Shawna Craig appears to be in the action stage of change and states they are ready to start intensive lifestyle modifications and behavioral modifications.  30 minutes was spent today on this visit including the above counseling, pre-visit chart review, and post-visit documentation.  Reviewed by clinician on day of visit: allergies, medications, problem list, medical history, surgical history, family history, social history, and previous encounter notes pertinent to obesity diagnosis.    Theodis Sato Maicie Vanderloop FNP-C

## 2022-09-09 DIAGNOSIS — F411 Generalized anxiety disorder: Secondary | ICD-10-CM | POA: Diagnosis not present

## 2022-09-09 DIAGNOSIS — F324 Major depressive disorder, single episode, in partial remission: Secondary | ICD-10-CM | POA: Diagnosis not present

## 2022-09-16 DIAGNOSIS — F324 Major depressive disorder, single episode, in partial remission: Secondary | ICD-10-CM | POA: Diagnosis not present

## 2022-09-16 DIAGNOSIS — F411 Generalized anxiety disorder: Secondary | ICD-10-CM | POA: Diagnosis not present

## 2022-09-21 ENCOUNTER — Encounter (INDEPENDENT_AMBULATORY_CARE_PROVIDER_SITE_OTHER): Payer: Self-pay | Admitting: Family Medicine

## 2022-09-21 ENCOUNTER — Ambulatory Visit (INDEPENDENT_AMBULATORY_CARE_PROVIDER_SITE_OTHER): Payer: BC Managed Care – PPO | Admitting: Family Medicine

## 2022-09-21 VITALS — BP 110/77 | HR 81 | Temp 98.0°F | Ht 62.5 in | Wt 179.2 lb

## 2022-09-21 DIAGNOSIS — Z1331 Encounter for screening for depression: Secondary | ICD-10-CM

## 2022-09-21 DIAGNOSIS — Z6835 Body mass index (BMI) 35.0-35.9, adult: Secondary | ICD-10-CM | POA: Diagnosis not present

## 2022-09-21 DIAGNOSIS — E668 Other obesity: Secondary | ICD-10-CM | POA: Diagnosis not present

## 2022-09-21 DIAGNOSIS — R5383 Other fatigue: Secondary | ICD-10-CM

## 2022-09-21 DIAGNOSIS — Z6832 Body mass index (BMI) 32.0-32.9, adult: Secondary | ICD-10-CM | POA: Diagnosis not present

## 2022-09-21 DIAGNOSIS — F39 Unspecified mood [affective] disorder: Secondary | ICD-10-CM

## 2022-09-21 DIAGNOSIS — J302 Other seasonal allergic rhinitis: Secondary | ICD-10-CM | POA: Diagnosis not present

## 2022-09-21 DIAGNOSIS — R7303 Prediabetes: Secondary | ICD-10-CM | POA: Diagnosis not present

## 2022-09-21 DIAGNOSIS — R0602 Shortness of breath: Secondary | ICD-10-CM | POA: Diagnosis not present

## 2022-09-21 DIAGNOSIS — E669 Obesity, unspecified: Secondary | ICD-10-CM | POA: Diagnosis not present

## 2022-09-21 NOTE — Progress Notes (Signed)
[name], D.O.  AB[facility], ABOM Specializing in Clinical Bariatric Medicine Office located at: [address] [place], [place]  [address]     Initial Bariatric Medicine Consultation Visit  Dear Shawna Craig, Audrie Gallus, MD   Thank you for referring Shawna Craig to our clinic today for evaluation.  We performed a consultation to discuss her options for treatment and educate the patient on her disease state.  The following note includes my evaluation and treatment recommendations.   Please do not hesitate to reach out to me directly if you have any further concerns.   Assessment and Plan:   Orders Placed This Encounter  Procedures   VITAMIN D 25 Hydroxy (Vit-D Deficiency, Fractures)   TSH   T4, free   Lipid panel   Insulin, random   Hemoglobin A1c   Comprehensive metabolic panel   CBC with Differential/Platelet   Vitamin B12   EKG 12-Lead    There are no discontinued medications.   No orders of the defined types were placed in this encounter.    1) Fatigue Assessment: Condition is Not optimized. [name] does feel that her weight is causing her energy to be lower than it should be. Fatigue may be related to obesity, depression or many other causes. she does not appear to have any red flag symptoms and this appears to most likely related to her current lifestyle habits and dietary intake.  Plan:  Labs will be ordered and reviewed with her at their next office visit in two weeks. Epworth sleepiness scale score appears to be within normal limits.  Her ESS score is 3.   [name] denies daytime somnolence and admits to waking up still tired. [name] states that she has generally unrestful sleep and sleeps 5-6 hours. Snoring is not present. [name] is not present.  ECG: Normal sinus rhythm, rate 71 bpm; reassuring without any acute abnormalities, will continue to monitor for symptoms  Modified PHQ-9 Depression Screen: Her Food and Mood (modified PHQ-9) score was  13. In the meanwhile, Emeline will focus on self care including making healthy food choices by following their meal plan, improving sleep quality and focusing on stress reduction.  Once we are assured she is on an appropriate meal plan, we will start discussing exercise to increase cardiovascular fitness levels.    2) Shortness of breath on exertion Assessment: Condition is Not optimized. [name] does feel that she gets out of breath more easily than she used to when she exercises and seems to be worsening over time with weight gain. This has gotten worse recently. [name] denies shortness of breath at rest or orthopnea. [name]'s shortness of breath appears to be obesity related and exercise induced, as they do not appear to have any "red flag" symptoms/ concerns today.  Also, this condition appears to be related to a state of poor cardiovascular conditioning   Plan:  Obtain labs today and will be reviewed with her at their next office visit in two weeks. Indirect Calorimeter completed today to help guide our dietary regimen. It shows a VO2 of 244 and a REE of 1685.  Her calculated basal metabolic rate is 6045 thus her measured basal metabolic rate is better than expected. Patient agreed to work on weight loss at this time.  As [name] progresses through our weight loss program, we will gradually increase exercise as tolerated to treat her current condition.   If [name] follows our recommendations and loses 5-10% of their weight without improvement of  her shortness of breath or if at any time, symptoms become more concerning, they agree to urgently follow up with their PCP/ specialist for further consideration/ evaluation.   [name] verbalizes agreement with this plan.    Depression screening   Mood disorder [name])- Emotional Eating Assessment: Condition is stable. - Denies any SI/HI. Mood is stable. - She reports going through depression and anxiety after the death of her husband, which  occurred approximately 7 yrs ago. - She has a Veterinary surgeon that she sees every other week.  - Patient endorses eating when stressed, sad, bored, as a reward, and to help comfort herself. - No issues with [name], denies any side effects.  Plan:  - Check labs.  - Continue with med as prescribed by specialist. Continue with meeting with counselor. - Patient was referred to [name], our Bariatric Psychologist, for evaluation due to struggles with emotional eating. - We will continue to monitor closely alongside PCP / other specialists.    Seasonal allergies Assessment: Condition is stable Symptoms are well controlled with Flonase. Patient reports compliance with medication and good tolerance.  Plan: - Continue current regiment as recommended by PCP - Will continue to monitor symptoms alongside PCP.  TREATMENT PLAN FOR OBESITY: Class 1 obesity with serious comorbidity and body mass index (BMI) of 32.0 to 32.9 in adult, unspecified obesity type Assessment: Condition is not optimized. Fat mass has decreased by 2.6 lb. Muscle mass has decreased by 4.4 lb. Total body water has decreased by 4.8 lb.   Plan:  - [name] will work on healthier eating habits and begin the Category 2 meal plan and journaling (if she desires) 1200-1300 calories and 90+ grams of protein daily   - I extensively reviewed the meal plan with the patient and all her questions were answered appropriately.   Behavioral Intervention Additional resources provided today: category 2 meal plan information and food journaling log  Evidence-based interventions for health behavior change were utilized today including the discussion of self monitoring techniques, problem-solving barriers and SMART goal setting techniques.   Regarding patient's less desirable eating habits and patterns, we employed the technique of small changes.  Pt will specifically work on: begin the Category 2 meal plan and journaling -if she  desires- 1200 to 1300 calories and 90+ grams of protein daily for next visit.    Recommended Physical Activity Goals [name] has been advised to work up to 150 minutes of moderate intensity aerobic activity a week and strengthening exercises 2-3 times per week for cardiovascular health, weight loss maintenance and preservation of muscle mass.  She has agreed to continue their current level of activity  FOLLOW UP: Follow up in 2 weeks. She was informed of the importance of frequent follow up visits to maximize her success with intensive lifestyle modifications for her multiple health conditions.  LANORE DOOLIN is aware that we will review all of her lab results at our next visit.  She is aware that if anything is critical/ life threatening with the results, we will be contacting her via MyChart prior to the office visit to discuss management.    Chief Complaint:   OBESITY PEACHIE HEGLAND (MR# [id number]) is a 37 y.o. female who presents for evaluation and treatment of obesity and related comorbidities. Current BMI is Body mass index is 32.25 kg/m. Yelena has been struggling with her weight for many years and has been unsuccessful in either losing weight, maintaining weight loss, or reaching her healthy weight  goal.  NITARA SCHABEL is currently in the action stage of change and ready to dedicate time achieving and maintaining a healthier weight. Isioma is interested in becoming our patient and working on intensive lifestyle modifications including (but not limited to) diet and exercise for weight loss.  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 Patient is widowed and has 2 children (6 and 86 yrs old) . She lives with her children.  Hawraa R Guarnieri's habits were reviewed today and are as follows:  - She has tried [employer], [employer], and [employer] in the past. Found that [employer] worked best for her because of the discipline  and motivation.   - Does cardio and weights 30-60 minutes, 3 days a week.   - She endorses eating outside of the home every day.   - Eats fast food/take out 5-7 days a week.  - Craves salty, sweet, and comfort foods (typically in the late afternoons to evenings)   - Snacks on chips and sweets after dinner.   - Sometimes skips breakfast or dinner  - Worst food habit: fast food and soda  - Drinks soda daily  Subjective:   This is the patient's first visit at [facility].  The patient's NEW PATIENT PACKET that they filled out prior to today's office visit was reviewed at length and information from that paperwork was included within the following office visit note.    Included in the packet: current and past health history, medications, allergies, ROS, gynecologic history (women only), surgical history, family history, social history, weight history, weight loss surgery history (for those that have had weight loss surgery), nutritional evaluation, mood and food questionnaire along with a depression screening (PHQ9) on all patients, an Epworth questionnaire, sleep habits questionnaire, patient life and health improvement goals questionnaire. These will all be scanned into the patient's chart under the "media" tab.   Review of Systems: Please refer to new patient packet scanned into media. Pertinent positives were addressed with patient today.  Objective:   PHYSICAL EXAM: Blood pressure 110/77, pulse 81, temperature 98 F (36.7 C), height 5' 2.5" (1.588 m), weight 179 lb 3.2 oz (81.3 kg), last menstrual period 07/30/2022, SpO2 97 %. Body mass index is 32.25 kg/m. General: Well Developed, well nourished, and in no acute distress.  HEENT: Normocephalic, atraumatic Skin: Warm and dry, cap RF less 2 sec, good turgor Chest:  Normal excursion, shape, no gross abn Respiratory: speaking in full sentences, no conversational dyspnea NeuroM-Sk: Ambulates w/o assistance, moves *  4 Psych: A and O *3, insight good, mood-full  Anthropometric Measurements Height: 5' 2.5" (1.588 m) Weight: 179 lb 3.2 oz (81.3 kg) BMI (Calculated): 32.23 Weight at Last Visit: N/A Weight Lost Since Last Visit: N/A Weight Gained Since Last Visit: N/A Starting Weight: 179.2 lb Peak Weight: 208 lb Waist Measurement : 40 inches   Body Composition  Body Fat %: 39.4 % Fat Mass (lbs): 70.6 lbs Muscle Mass (lbs): 103 lbs Total Body Water (lbs): 73.4 lbs Visceral Fat Rating : 8   Other Clinical Data RMR: [id number] Fasting: YES Labs: YES Today's Visit #: #1 Starting Date: 09/21/22 Comments: FIRST VISIT   DIAGNOSTIC DATA REVIEWED:  BMET    Component Value Date/Time   NA 138 01/26/2022 1559   K 4.5 01/26/2022 1559   CL 103 01/26/2022 1559   CO2 26 01/26/2022 1559   GLUCOSE 88 01/26/2022 1559   BUN 15  01/26/2022 1559   CREATININE 0.88 01/26/2022 1559   CALCIUM 9.2 01/26/2022 1559   Lab Results  Component Value Date   HGBA1C 5.2 08/20/2022   HGBA1C 5.7 01/26/2022   No results found for: "INSULIN" Lab Results  Component Value Date   TSH 1.79 01/26/2022   CBC    Component Value Date/Time   WBC 8.2 01/26/2022 1559   RBC 4.48 01/26/2022 1559   HGB 13.1 01/26/2022 1559   HGB 12.0 07/15/2015 0903   HCT 39.4 01/26/2022 1559   HCT 35.2 07/15/2015 0903   PLT 315.0 01/26/2022 1559   PLT 216 07/15/2015 0903   MCV 88.0 01/26/2022 1559   MCV 90 07/15/2015 0903   MCH 29.2 10/09/2015 0551   MCHC 33.2 01/26/2022 1559   RDW 12.8 01/26/2022 1559   RDW 13.0 07/15/2015 0903   Iron Studies    Component Value Date/Time   FERRITIN 34 07/15/2015 0903   Lipid Panel     Component Value Date/Time   CHOL 180 01/26/2022 1559   TRIG 107.0 01/26/2022 1559   HDL 57.50 01/26/2022 1559   CHOLHDL 3 01/26/2022 1559   VLDL 21.4 01/26/2022 1559   LDLCALC 101 (H) 01/26/2022 1559   Hepatic Function Panel     Component Value Date/Time   PROT 7.0 01/26/2022 1559   ALBUMIN 3.9  01/26/2022 1559   AST 13 01/26/2022 1559   ALT 15 01/26/2022 1559   ALKPHOS 76 01/26/2022 1559   BILITOT 0.3 01/26/2022 1559      Component Value Date/Time   TSH 1.79 01/26/2022 1559   Nutritional No results found for: "VD25OH"  Attestation Statements:   Reviewed by clinician on day of visit: allergies, medications, problem list, medical history, surgical history, family history, social history, and previous encounter notes.  During the visit, I independently reviewed the patient's EKG, bioimpedance scale results, and indirect calorimeter results. I used this information to tailor a meal plan for the patient that will help Ashok Cordia Rabadan to lose weight and will improve her obesity-related conditions going forward.  I performed a medically necessary appropriate examination and/or evaluation. I discussed the assessment and treatment plan with the patient. The patient was provided an opportunity to ask questions and all were answered. The patient agreed with the plan and demonstrated an understanding of the instructions. Labs were ordered today (unless patient declined them) and will be reviewed with the patient at our next visit unless more critical results need to be addressed immediately. Clinical information was updated and documented in the EMR.  Time spent on visit including pre-visit chart review and post-visit care was estimated to be 60  minutes.    I,Special Puri,acting as a Neurosurgeon for [employer], DO.,have documented all relevant documentation on the behalf of Thomasene Lot, DO,as directed by  Thomasene Lot, DO while in the presence of Thomasene Lot, DO.   I, Thomasene Lot, DO, have reviewed all documentation for this visit. The documentation on 09/21/22 for the exam, diagnosis, procedures, and orders are all accurate and complete.

## 2022-09-22 LAB — COMPREHENSIVE METABOLIC PANEL
ALT: 32 IU/L (ref 0–32)
AST: 24 IU/L (ref 0–40)
Albumin/Globulin Ratio: 1.7 (ref 1.2–2.2)
Albumin: 4.3 g/dL (ref 3.9–4.9)
Alkaline Phosphatase: 74 IU/L (ref 44–121)
BUN/Creatinine Ratio: 5 — ABNORMAL LOW (ref 9–23)
BUN: 5 mg/dL — ABNORMAL LOW (ref 6–20)
Bilirubin Total: 0.3 mg/dL (ref 0.0–1.2)
CO2: 22 mmol/L (ref 20–29)
Calcium: 9.6 mg/dL (ref 8.7–10.2)
Chloride: 101 mmol/L (ref 96–106)
Creatinine, Ser: 0.97 mg/dL (ref 0.57–1.00)
Globulin, Total: 2.6 g/dL (ref 1.5–4.5)
Glucose: 84 mg/dL (ref 70–99)
Potassium: 4 mmol/L (ref 3.5–5.2)
Sodium: 141 mmol/L (ref 134–144)
Total Protein: 6.9 g/dL (ref 6.0–8.5)
eGFR: 78 mL/min/{1.73_m2} (ref >59)

## 2022-09-22 LAB — CBC WITH DIFFERENTIAL/PLATELET
Basophils Absolute: 0 10*3/uL (ref 0.0–0.2)
Basos: 1 %
EOS (ABSOLUTE): 0.1 10*3/uL (ref 0.0–0.4)
Eos: 1 %
Hematocrit: 43.6 % (ref 34.0–46.6)
Hemoglobin: 14.5 g/dL (ref 11.1–15.9)
Immature Grans (Abs): 0 10*3/uL (ref 0.0–0.1)
Immature Granulocytes: 0 %
Lymphocytes Absolute: 1.8 10*3/uL (ref 0.7–3.1)
Lymphs: 28 %
MCH: 29.4 pg (ref 26.6–33.0)
MCHC: 33.3 g/dL (ref 31.5–35.7)
MCV: 88 fL (ref 79–97)
Monocytes Absolute: 0.5 10*3/uL (ref 0.1–0.9)
Monocytes: 7 %
Neutrophils Absolute: 4.2 10*3/uL (ref 1.4–7.0)
Neutrophils: 63 %
Platelets: 285 10*3/uL (ref 150–450)
RBC: 4.94 x10E6/uL (ref 3.77–5.28)
RDW: 12.7 % (ref 11.7–15.4)
WBC: 6.6 10*3/uL (ref 3.4–10.8)

## 2022-09-22 LAB — LIPID PANEL
Chol/HDL Ratio: 2.6 ratio (ref 0.0–4.4)
Cholesterol, Total: 174 mg/dL (ref 100–199)
HDL: 66 mg/dL (ref >39)
LDL Chol Calc (NIH): 93 mg/dL (ref 0–99)
Triglycerides: 83 mg/dL (ref 0–149)
VLDL Cholesterol Cal: 15 mg/dL (ref 5–40)

## 2022-09-22 LAB — HEMOGLOBIN A1C
Est. average glucose Bld gHb Est-mCnc: 111 mg/dL
Hgb A1c MFr Bld: 5.5 % (ref 4.8–5.6)

## 2022-09-22 LAB — VITAMIN B12: Vitamin B-12: 350 pg/mL (ref 232–1245)

## 2022-09-22 LAB — VITAMIN D 25 HYDROXY (VIT D DEFICIENCY, FRACTURES): Vit D, 25-Hydroxy: 45 ng/mL (ref 30.0–100.0)

## 2022-09-22 LAB — INSULIN, RANDOM: INSULIN: 9.1 u[IU]/mL (ref 2.6–24.9)

## 2022-09-22 LAB — TSH: TSH: 1.17 u[IU]/mL (ref 0.450–4.500)

## 2022-09-22 LAB — T4, FREE: Free T4: 1.47 ng/dL (ref 0.82–1.77)

## 2022-09-27 DIAGNOSIS — F411 Generalized anxiety disorder: Secondary | ICD-10-CM | POA: Diagnosis not present

## 2022-09-27 DIAGNOSIS — F331 Major depressive disorder, recurrent, moderate: Secondary | ICD-10-CM | POA: Diagnosis not present

## 2022-09-29 ENCOUNTER — Encounter: Payer: Self-pay | Admitting: Obstetrics and Gynecology

## 2022-09-29 ENCOUNTER — Other Ambulatory Visit: Payer: Self-pay | Admitting: Obstetrics and Gynecology

## 2022-09-29 MED ORDER — FLUCONAZOLE 150 MG PO TABS: 150.0000 mg | ORAL_TABLET | Freq: Once | ORAL | 0 refills | Status: AC

## 2022-09-29 NOTE — Progress Notes (Signed)
Rx diflucan for yeast vag sx. RTO if sx persist.

## 2022-09-30 DIAGNOSIS — F411 Generalized anxiety disorder: Secondary | ICD-10-CM | POA: Diagnosis not present

## 2022-09-30 DIAGNOSIS — F324 Major depressive disorder, single episode, in partial remission: Secondary | ICD-10-CM | POA: Diagnosis not present

## 2022-10-05 ENCOUNTER — Encounter (INDEPENDENT_AMBULATORY_CARE_PROVIDER_SITE_OTHER): Payer: Self-pay | Admitting: Family Medicine

## 2022-10-05 ENCOUNTER — Ambulatory Visit (INDEPENDENT_AMBULATORY_CARE_PROVIDER_SITE_OTHER): Payer: BC Managed Care – PPO | Admitting: Family Medicine

## 2022-10-05 VITALS — BP 103/69 | HR 96 | Temp 98.2°F | Ht 62.5 in | Wt 177.0 lb

## 2022-10-05 DIAGNOSIS — E559 Vitamin D deficiency, unspecified: Secondary | ICD-10-CM | POA: Diagnosis not present

## 2022-10-05 DIAGNOSIS — Z6831 Body mass index (BMI) 31.0-31.9, adult: Secondary | ICD-10-CM

## 2022-10-05 DIAGNOSIS — E538 Deficiency of other specified B group vitamins: Secondary | ICD-10-CM | POA: Diagnosis not present

## 2022-10-05 DIAGNOSIS — E668 Other obesity: Secondary | ICD-10-CM

## 2022-10-05 DIAGNOSIS — F39 Unspecified mood [affective] disorder: Secondary | ICD-10-CM | POA: Diagnosis not present

## 2022-10-05 DIAGNOSIS — E669 Obesity, unspecified: Secondary | ICD-10-CM

## 2022-10-05 DIAGNOSIS — R7303 Prediabetes: Secondary | ICD-10-CM

## 2022-10-05 MED ORDER — CYANOCOBALAMIN 500 MCG PO TABS
500.0000 ug | ORAL_TABLET | Freq: Every day | ORAL | Status: DC
Start: 2022-10-05 — End: 2023-01-04

## 2022-10-05 NOTE — Progress Notes (Signed)
[name], D.O.  ABFM, ABOM Specializing in Clinical Bariatric Medicine  Office located at: [address] [place], [place]  [address]     Assessment and Plan:   Meds ordered this encounter  Medications   cyanocobalamin (VITAMIN B12) 500 MCG tablet    Sig: Take 1 tablet (500 mcg total) by mouth daily.     Mood disorder (HCC)- Emotional Eating Assessment: Condition is stable. Labs were reviewed with pt today and education provided on them and how the foods patient eats may influence these findings. All questions were answered about them.   Lab Results  Component Value Date   TSH 1.170 09/21/2022   FREET4 1.47 09/21/2022   Lab Results  Component Value Date   CHOL 174 09/21/2022   HDL 66 09/21/2022   LDLCALC 93 09/21/2022   TRIG 83 09/21/2022   CHOLHDL 2.6 09/21/2022  - Denies any SI/HI. Mood is stable.  - Patient endorses that emotions play a huge role in her food decision making.  - She has an upcoming meeting with Dr.Barker - She reports good compliance and tolerance with [name], denies any side effects. - Her labs indicate that her Thyroid function is normal and that her lipid levels are within normal limits.   Plan:  - Continue with med as prescribed by specialist and meet with Dr.Barker.  -  I stressed the importance that patient continue with our prudent nutritional plan that is low in saturated and trans fats, and low in fatty carbs to improve these numbers.  - We recommend: aerobic activity with eventual goal of a minimum of 150+ min wk plus 2 days/ week of resistance or strength training.   - We will continue to monitor closely alongside PCP / other specialists.     Prediabetes Assessment: Condition is not optimized. Labs were reviewed with patient today and education provided on them. We discussed how the foods patient eats may influence these laboratory findings.  All of the patient's questions about them were answered   Lab  Results  Component Value Date   HGBA1C 5.5 09/21/2022   HGBA1C 5.2 08/20/2022   HGBA1C 5.7 01/26/2022   INSULIN 9.1 09/21/2022    Lab Results  Component Value Date   WBC 6.6 09/21/2022   HGB 14.5 09/21/2022   HCT 43.6 09/21/2022   MCV 88 09/21/2022   PLT 285 09/21/2022   Lab Results  Component Value Date   CREATININE 0.97 09/21/2022   BUN 5 (L) 09/21/2022   NA 141 09/21/2022   K 4.0 09/21/2022   CL 101 09/21/2022   CO2 22 09/21/2022      Component Value Date/Time   PROT 6.9 09/21/2022 1253   ALBUMIN 4.3 09/21/2022 1253   AST 24 09/21/2022 1253   ALT 32 09/21/2022 1253   ALKPHOS 74 09/21/2022 1253   BILITOT 0.3 09/21/2022 1253  Labs indicate that:  - Her Insulin levels are approximately twice normal.  - Her A1c levels are stable.  - Her blood counts are within normal limits and do not indicate that she has anemia.  - Her kidney and liver function are stable. Sodium levels imply that she is likely not drinking enough water.  Plan:  - I counseled patient on pathophysiology of the disease process of  Pre-DM. - Unless pre-existing renal or cardiopulmonary conditions exist which patient was told to limit their fluid intake by another provider, I recommended roughly one half of their weight in pounds,  to be the approximate ounces of non-caloric, non-caffeinated beverages they should drink per day; including more if they are engaging in exercise.  - Stressed importance of dietary and lifestyle modifications to result in weight loss as first line txmnt - Continue to decrease simple carbs/ sugars; increase fiber and proteins -> follow her meal plan.   - Explained role of simple carbs and insulin levels on hunger and cravings - Handouts provided at pt's request after education provided.  All concerns/questions addressed.    - Clarabell will continue to work on weight loss, exercise, via their meal plan we devised to help decrease the risk of progressing to diabetes.  - We will  recheck A1c and fasting insulin level in approximately 3 months from last check, or as deemed appropriate.      Vitamin D deficiency  Assessment: Condition is new and not optimized. Labs were reviewed with pt today and education provided on them and how the foods patient eats may influence these findings. All questions were answered about them.   Lab Results  Component Value Date   VD25OH 45.0 09/21/2022  - Labs indicate that her Vitamin D levels are not quite within the recommended normal limits of 50-80. - She is not currently taking any Vitamin D supplement.   Plan: - Exposure to sun and weight loss will likely improve availability of vitamin D, thus encouraged Shyler to continue with meal plan and their weight loss efforts to further improve this condition.  Thus, we will need to monitor levels regularly (every 3-4 mo on average) to keep levels within normal limits and prevent over supplementation.  - Informed patient that we may start her on a Vitamin D supplement in the winter.     B12 deficiency  Assessment: Condition is new and not optimized. Labs were reviewed with pt today and education provided on them and how the foods patient eats may influence these findings. All questions were answered about them.   Lab Results  Component Value Date   VITAMINB12 350 09/21/2022  - Labs indicate that her Vitamin B12 levels are not within the recommended normal limits of 500+  - She is currently not taking any B12 supplement. This is diet controlled.   Plan: - Begin OTC Vitamin B12 500 mcg daily.  - Continue their prudent nutritional plan and focus on b12 rich foods such as lean red meats; poultry; eggs; seafood; beans, peas, and lentils; nuts and seeds; and soy products - We will continue to monitor as deemed clinically necessary.   TREATMENT PLAN FOR OBESITY: Class 1 obesity with serious comorbidity and body mass index (BMI) of 32.0 to 32.9 in adult, unspecified obesity  type Assessment:  JANICE SCHIEDEL is here to discuss her progress with her obesity treatment plan along with follow-up of her obesity related diagnoses. See Medical Weight Management Flowsheet for complete bioelectrical impedance results.  Condition is improving, but not optimized. Biometric data collected today, was reviewed with patient.   Since last office visit on 09/21/22 patient's Fat mass has decreased by 3.4 lb. Muscle mass has increased by 1.2 lb. Total body water has increased by 1.4 lb.  Counseling done on how various foods will affect these numbers and how to maximize success  Total lbs lost to date: 2  Total weight loss percentage to date: 1.12   Plan:  - Tyeler will work on healthier eating habits and continue the Category 2 Plan with additional lunch options and keeping a food journal and adhering  to recommended goals of 1200-1300 calories and 90+ protein.   - Recommended pt to try new recipes from [employer], [employer], and Crockpot recipe handout.    Behavioral Intervention Additional resources provided today: category 2 meal plan information, lunch options, and Crockpot recipes , Insulin and Prediabetes Handout Evidence-based interventions for health behavior change were utilized today including the discussion of self monitoring techniques, problem-solving barriers and SMART goal setting techniques.   Regarding patient's less desirable eating habits and patterns, we employed the technique of small changes.  Pt will specifically work on: continue adherence to prudent nutritional plan  for next visit.    Recommended Physical Activity Goals  Maren has been advised to slowly work up to 150 minutes of moderate intensity aerobic activity a week and strengthening exercises 2-3 times per week for cardiovascular health, weight loss maintenance and preservation of muscle mass.   She has agreed to Continue current level of physical activity    FOLLOW UP: Return in  about 2 weeks (around 10/19/2022). She was informed of the importance of frequent follow up visits to maximize her success with intensive lifestyle modifications for her multiple health conditions.   Subjective:   Chief complaint: Obesity Tysheria is here to discuss her progress with her obesity treatment plan. She is on  the Category 2 Plan and keeping a food journal and adhering to recommended goals of 1200-1300 calories and 90+ protein and states she is following her eating plan approximately 50 % of the time. She states she is exercising (circuit training + cardio)  30-45 minutes 2-3 days per week.  Interval History:  KALICIA WICHT is here today for her first follow-up office visit since starting the program with [place].    Since last office visit she  - Endorses that it was difficult to completely follow the meal plan.  - Learned that her emotions plays a huge role in her food decision making. - Overall, she felt her hunger and cravings were controlled on days she ate all the protein on the plan.  - Reports that she struggled with recipe ideas for dinner.   All blood work/ lab tests that were recently ordered by myself or an outside provider were reviewed with patient today per their request. Extended time was spent counseling her on all new disease processes that were discovered or preexisting ones that are affected by BMI.  she understands that many of these abnormalities will need to monitored regularly along with the current treatment plan of prudent dietary changes, in which we are making each and every office visit, to improve these health parameters.   Review of Systems:  Pertinent positives were addressed with patient today.  Weight Summary and Biometrics   Weight Lost Since Last Visit: 2lb  No data recorded  Vitals Temp: 98.2 F (36.8 C) BP: 103/69 Pulse Rate: 96 SpO2: 99 %   Anthropometric Measurements Height: 5' 2.5" (1.588 m) Weight: 177 lb (80.3 kg) BMI  (Calculated): 31.84 Weight at Last Visit: 179lb Weight Lost Since Last Visit: 2lb Starting Weight: 179lb Total Weight Loss (lbs): 2 lb (0.907 kg)   Body Composition  Body Fat %: 38 % Fat Mass (lbs): 67.2 lbs Muscle Mass (lbs): 104.2 lbs Total Body Water (lbs): 74.8 lbs Visceral Fat Rating : 7   Other Clinical Data Fasting: no Labs: no Today's Visit #: 2 Starting Date: 09/21/22    Objective:   PHYSICAL EXAM:  Blood pressure 103/69, pulse 96, temperature 98.2 F (36.8 C), height  5' 2.5" (1.588 m), weight 177 lb (80.3 kg), SpO2 99 %. Body mass index is 31.86 kg/m.  General: Well Developed, well nourished, and in no acute distress.  HEENT: Normocephalic, atraumatic Skin: Warm and dry, cap RF less 2 sec, good turgor Chest:  Normal excursion, shape, no gross abn Respiratory: speaking in full sentences, no conversational dyspnea NeuroM-Sk: Ambulates w/o assistance, moves * 4 Psych: A and O *3, insight good, mood-full  DIAGNOSTIC DATA REVIEWED:  BMET    Component Value Date/Time   NA 141 09/21/2022 1253   K 4.0 09/21/2022 1253   CL 101 09/21/2022 1253   CO2 22 09/21/2022 1253   GLUCOSE 84 09/21/2022 1253   GLUCOSE 88 01/26/2022 1559   BUN 5 (L) 09/21/2022 1253   CREATININE 0.97 09/21/2022 1253   CALCIUM 9.6 09/21/2022 1253   Lab Results  Component Value Date   HGBA1C 5.5 09/21/2022   HGBA1C 5.7 01/26/2022   Lab Results  Component Value Date   INSULIN 9.1 09/21/2022   Lab Results  Component Value Date   TSH 1.170 09/21/2022   CBC    Component Value Date/Time   WBC 6.6 09/21/2022 1253   WBC 8.2 01/26/2022 1559   RBC 4.94 09/21/2022 1253   RBC 4.48 01/26/2022 1559   HGB 14.5 09/21/2022 1253   HCT 43.6 09/21/2022 1253   PLT 285 09/21/2022 1253   MCV 88 09/21/2022 1253   MCH 29.4 09/21/2022 1253   MCH 29.2 10/09/2015 0551   MCHC 33.3 09/21/2022 1253   MCHC 33.2 01/26/2022 1559   RDW 12.7 09/21/2022 1253   Iron Studies    Component Value  Date/Time   FERRITIN 34 07/15/2015 0903   Lipid Panel     Component Value Date/Time   CHOL 174 09/21/2022 1253   TRIG 83 09/21/2022 1253   HDL 66 09/21/2022 1253   CHOLHDL 2.6 09/21/2022 1253   CHOLHDL 3 01/26/2022 1559   VLDL 21.4 01/26/2022 1559   LDLCALC 93 09/21/2022 1253   Hepatic Function Panel     Component Value Date/Time   PROT 6.9 09/21/2022 1253   ALBUMIN 4.3 09/21/2022 1253   AST 24 09/21/2022 1253   ALT 32 09/21/2022 1253   ALKPHOS 74 09/21/2022 1253   BILITOT 0.3 09/21/2022 1253      Component Value Date/Time   TSH 1.170 09/21/2022 1253   Nutritional Lab Results  Component Value Date   VD25OH 45.0 09/21/2022    Attestations:   Reviewed by clinician on day of visit: allergies, medications, problem list, medical history, surgical history, family history, social history, and previous encounter notes.   Patient was in the office today and time spent on visit including pre-visit chart review and post-visit care/coordination of care and electronic medical record documentation was 52 minutes. 50% of the time was in face to face counseling of this patient's medical condition(s) and providing education on treatment options to include the first-line treatment of diet and lifestyle modification.   I,Special Puri,acting as a Neurosurgeon for [employer], DO.,have documented all relevant documentation on the behalf of Thomasene Lot, DO,as directed by  Thomasene Lot, DO while in the presence of Thomasene Lot, DO.   I, Thomasene Lot, DO, have reviewed all documentation for this visit. The documentation on 10/05/22 for the exam, diagnosis, procedures, and orders are all accurate and complete.

## 2022-10-14 DIAGNOSIS — F411 Generalized anxiety disorder: Secondary | ICD-10-CM | POA: Diagnosis not present

## 2022-10-14 DIAGNOSIS — F324 Major depressive disorder, single episode, in partial remission: Secondary | ICD-10-CM | POA: Diagnosis not present

## 2022-10-18 ENCOUNTER — Ambulatory Visit (INDEPENDENT_AMBULATORY_CARE_PROVIDER_SITE_OTHER): Payer: BC Managed Care – PPO | Admitting: Family Medicine

## 2022-10-19 ENCOUNTER — Telehealth (INDEPENDENT_AMBULATORY_CARE_PROVIDER_SITE_OTHER): Payer: BC Managed Care – PPO | Admitting: Psychology

## 2022-10-20 DIAGNOSIS — F331 Major depressive disorder, recurrent, moderate: Secondary | ICD-10-CM | POA: Diagnosis not present

## 2022-10-20 DIAGNOSIS — F411 Generalized anxiety disorder: Secondary | ICD-10-CM | POA: Diagnosis not present

## 2022-10-25 ENCOUNTER — Encounter: Payer: Self-pay | Admitting: Obstetrics and Gynecology

## 2022-10-26 DIAGNOSIS — F324 Major depressive disorder, single episode, in partial remission: Secondary | ICD-10-CM | POA: Diagnosis not present

## 2022-10-26 DIAGNOSIS — F411 Generalized anxiety disorder: Secondary | ICD-10-CM | POA: Diagnosis not present

## 2022-11-01 ENCOUNTER — Ambulatory Visit (INDEPENDENT_AMBULATORY_CARE_PROVIDER_SITE_OTHER): Payer: BC Managed Care – PPO | Admitting: Family Medicine

## 2022-11-01 ENCOUNTER — Encounter (INDEPENDENT_AMBULATORY_CARE_PROVIDER_SITE_OTHER): Payer: Self-pay | Admitting: Family Medicine

## 2022-11-01 VITALS — BP 105/74 | HR 95 | Temp 98.0°F | Ht 62.5 in | Wt 175.0 lb

## 2022-11-01 DIAGNOSIS — E668 Other obesity: Secondary | ICD-10-CM | POA: Diagnosis not present

## 2022-11-01 DIAGNOSIS — E538 Deficiency of other specified B group vitamins: Secondary | ICD-10-CM

## 2022-11-01 DIAGNOSIS — Z6831 Body mass index (BMI) 31.0-31.9, adult: Secondary | ICD-10-CM

## 2022-11-01 DIAGNOSIS — E669 Obesity, unspecified: Secondary | ICD-10-CM

## 2022-11-01 DIAGNOSIS — E559 Vitamin D deficiency, unspecified: Secondary | ICD-10-CM

## 2022-11-01 DIAGNOSIS — R7303 Prediabetes: Secondary | ICD-10-CM

## 2022-11-01 NOTE — Progress Notes (Signed)
Shawna Craig, D.O.  ABFM, ABOM Specializing in Clinical Bariatric Medicine  Office located at: [address] [place], [place]  [address]     Assessment and Plan:    Prediabetes Assessment: Condition is not optimized. This condition is diet controlled; She is not taking any prediabetic medications. Her hunger and cravings are better controlled when following the meal.   Lab Results  Component Value Date   HGBA1C 5.5 09/21/2022   HGBA1C 5.2 08/20/2022   HGBA1C 5.7 01/26/2022   INSULIN 9.1 09/21/2022    Plan:[name] will continue to work on weight loss, exercise, via their meal plan we devised to help decrease the risk of progressing to diabetes. We will recheck A1c and fasting insulin level in approximately 3 months from last check, or as deemed appropriate.     Vitamin D deficiency Assessment: Condition is not quite at goal. Shawna Craig is currently not taking any Vitamin D supplements. I anticipate her Vitamin D will go up now that it is summer.   Lab Results  Component Value Date   VD25OH 45.0 09/21/2022   Plan: Sun exposure and Weight loss will likely improve availability of vitamin D, thus encouraged Shawna Craig to continue with meal plan and their weight loss efforts to further improve this condition.  Thus, we will need to monitor levels regularly (every 3-4 mo on average) to keep levels within normal limits and prevent over supplementation.    B12 deficiency Assessment: Condition is not optimized. She has been compliant and tolerant with OTC Vitamin B12 500 mcg daily. Denies any adverse effects.  Lab Results  Component Value Date   VITAMINB12 350 09/21/2022    Plan: Continue with OTC supplement.  Continue her prudent nutritional plan that is low in simple carbohydrates, saturated fats and trans fats to goal of 5-10% weight loss to achieve significant health benefits. Will continue to monitor condition as it relates to her weight loss journey.   TREATMENT  PLAN FOR OBESITY: Class 1 obesity with serious comorbidity and body mass index (BMI) of 32.0 to 32.9 in adult, unspecified obesity type Assessment: Shawna Craig is here to discuss her progress with her obesity treatment plan along with follow-up of her obesity related diagnoses. See Medical Weight Management Flowsheet for complete bioelectrical impedance results.  Condition is improving, but not optimized. Biometric data collected today, was reviewed with patient.   Since last office visit on 10/05/22 patient's  Muscle mass has decreased by 2.4 lb. Fat mass has increased by 1.2 lb. Total body water has decreased by 0.6 lb.  Counseling done on how various foods will affect these numbers and how to maximize success  Total lbs lost to date: - 4  Total weight loss percentage to date: 2.23%   Plan: Continue the Category 2 Plan with additional lunch options and begin journaling recommended goals of 1150-1250 calories and 85+ protein.   - I again emphasized the importance of journaling for mindful/intentional eating. I briefly showed pt how to accurately journal her intake.  Behavioral Intervention Additional resources provided today:  food journal log Evidence-based interventions for health behavior change were utilized today including the discussion of self monitoring techniques, problem-solving barriers and SMART goal setting techniques.   Regarding patient's less desirable eating habits and patterns, we employed the technique of small changes.  Pt will specifically work on: meal planning/prepping every Sunday and journaling her intake/bringing in her food log for next visit.    Recommended Physical Activity Goals  Shawna Craig has  been advised to slowly work up to 150 minutes of moderate intensity aerobic activity a week and strengthening exercises 2-3 times per week for cardiovascular health, weight loss maintenance and preservation of muscle mass.   She has agreed to Continue current level of  physical activity   FOLLOW UP: Return in about 2 weeks (around 11/15/2022). She was informed of the importance of frequent follow up visits to maximize her success with intensive lifestyle modifications for her multiple health conditions.  Subjective:   Chief complaint: Obesity Shawna Craig is here to discuss her progress with her obesity treatment plan. She is on the Category 2 Plan with lunch options and states she is following her eating plan approximately 30% of the time. She states she is doing circuit training and cardio 30-45 minutes 5 days per week.  Interval History:  Shawna Craig is here for a follow up office visit.   Since last office visit, Shawna Craig endorses that she has been struggling with consistency and meal prepping. She also reports sometimes skipping meals. She recently purchased a crock pot. She has an upcoming appointment with Dr.Barker on July 8th.   Review of Systems:  Pertinent positives were addressed with patient today.  Reviewed by clinician on day of visit: allergies, medications, problem list, medical history, surgical history, family history, social history, and previous encounter notes.  Weight Summary and Biometrics   Weight Lost Since Last Visit: 2lb  No data recorded   Vitals Temp: 98 F (36.7 C) BP: 105/74 Pulse Rate: 95 SpO2: 98 %   Anthropometric Measurements Height: 5' 2.5" (1.588 m) Weight: 175 lb (79.4 kg) BMI (Calculated): 31.48 Weight at Last Visit: 177lb Weight Lost Since Last Visit: 2lb Starting Weight: 179lb Total Weight Loss (lbs): 4 lb (1.814 kg) Peak Weight: 208lb   Body Composition  Body Fat %: 38.9 % Fat Mass (lbs): 68.4 lbs Muscle Mass (lbs): 101.8 lbs Total Body Water (lbs): 74.2 lbs Visceral Fat Rating : 7   Other Clinical Data Fasting: no Labs: no Today's Visit #: 3 Starting Date: 09/21/22   Objective:   PHYSICAL EXAM: Blood pressure 105/74, pulse 95, temperature 98 F (36.7 C), height 5' 2.5" (1.588  m), weight 175 lb (79.4 kg), last menstrual period 10/16/2022, SpO2 98 %. Body mass index is 31.5 kg/m.  General: Well Developed, well nourished, and in no acute distress.  HEENT: Normocephalic, atraumatic Skin: Warm and dry, cap RF less 2 sec, good turgor Chest:  Normal excursion, shape, no gross abn Respiratory: speaking in full sentences, no conversational dyspnea NeuroM-Sk: Ambulates w/o assistance, moves * 4 Psych: A and O *3, insight good, mood-full  DIAGNOSTIC DATA REVIEWED:  BMET    Component Value Date/Time   NA 141 09/21/2022 1253   K 4.0 09/21/2022 1253   CL 101 09/21/2022 1253   CO2 22 09/21/2022 1253   GLUCOSE 84 09/21/2022 1253   GLUCOSE 88 01/26/2022 1559   BUN 5 (L) 09/21/2022 1253   CREATININE 0.97 09/21/2022 1253   CALCIUM 9.6 09/21/2022 1253   Lab Results  Component Value Date   HGBA1C 5.5 09/21/2022   HGBA1C 5.7 01/26/2022   Lab Results  Component Value Date   INSULIN 9.1 09/21/2022   Lab Results  Component Value Date   TSH 1.170 09/21/2022   CBC    Component Value Date/Time   WBC 6.6 09/21/2022 1253   WBC 8.2 01/26/2022 1559   RBC 4.94 09/21/2022 1253   RBC 4.48 01/26/2022 1559   HGB 14.5 09/21/2022  1253   HCT 43.6 09/21/2022 1253   PLT 285 09/21/2022 1253   MCV 88 09/21/2022 1253   MCH 29.4 09/21/2022 1253   MCH 29.2 10/09/2015 0551   MCHC 33.3 09/21/2022 1253   MCHC 33.2 01/26/2022 1559   RDW 12.7 09/21/2022 1253   Iron Studies    Component Value Date/Time   FERRITIN 34 07/15/2015 0903   Lipid Panel     Component Value Date/Time   CHOL 174 09/21/2022 1253   TRIG 83 09/21/2022 1253   HDL 66 09/21/2022 1253   CHOLHDL 2.6 09/21/2022 1253   CHOLHDL 3 01/26/2022 1559   VLDL 21.4 01/26/2022 1559   LDLCALC 93 09/21/2022 1253   Hepatic Function Panel     Component Value Date/Time   PROT 6.9 09/21/2022 1253   ALBUMIN 4.3 09/21/2022 1253   AST 24 09/21/2022 1253   ALT 32 09/21/2022 1253   ALKPHOS 74 09/21/2022 1253    BILITOT 0.3 09/21/2022 1253      Component Value Date/Time   TSH 1.170 09/21/2022 1253   Nutritional Lab Results  Component Value Date   VD25OH 45.0 09/21/2022    Attestations:   I, Special Puri, acting as a Stage manager for [employer], DO., have compiled all relevant documentation for today's office visit on behalf of Shawna Lot, DO, while in the presence of [employer], DO.  I have reviewed the above documentation for accuracy and completeness, and I agree with the above. Shawna Craig, D.O.  The 21st Century Cures Act was signed into law in 2016 which includes the topic of electronic health records.  This provides immediate access to information in MyChart.  This includes consultation notes, operative notes, office notes, lab results and pathology reports.  If you have any questions about what you read please let us know at your next visit so we can discuss your concerns and take corrective action if need be.  We are right here with you.

## 2022-11-09 ENCOUNTER — Other Ambulatory Visit: Payer: Self-pay | Admitting: Obstetrics and Gynecology

## 2022-11-09 DIAGNOSIS — Z3041 Encounter for surveillance of contraceptive pills: Secondary | ICD-10-CM

## 2022-11-09 DIAGNOSIS — F411 Generalized anxiety disorder: Secondary | ICD-10-CM | POA: Diagnosis not present

## 2022-11-09 DIAGNOSIS — F324 Major depressive disorder, single episode, in partial remission: Secondary | ICD-10-CM | POA: Diagnosis not present

## 2022-11-22 ENCOUNTER — Telehealth (INDEPENDENT_AMBULATORY_CARE_PROVIDER_SITE_OTHER): Payer: BC Managed Care – PPO | Admitting: Psychology

## 2022-11-22 DIAGNOSIS — F5089 Other specified eating disorder: Secondary | ICD-10-CM | POA: Diagnosis not present

## 2022-11-22 DIAGNOSIS — F411 Generalized anxiety disorder: Secondary | ICD-10-CM

## 2022-11-22 NOTE — Progress Notes (Signed)
Office: [phone]  /  Fax: [phone]    Date: November 22, 2022    Appointment Start Time: 3:02pm Duration: 25 minutes Provider: Lawerance Cruel, Psy.D. Type of Session: Intake for Individual Therapy  Location of Patient: Home (private location) Location of Provider: Provider's home (private office) Type of Contact: Telepsychological Visit via MyChart Video Visit  Informed Consent: Prior to proceeding with today's appointment, two pieces of identifying information were obtained. In addition, Shawna Craig's physical location at the time of this appointment was obtained as well a phone number she could be reached at in the event of technical difficulties. Shawna Craig and this provider participated in today's telepsychological service.   The provider's role was explained to [name]. The provider reviewed and discussed issues of confidentiality, privacy, and limits therein (e.g., reporting obligations). In addition to verbal informed consent, written informed consent for psychological services was obtained prior to the initial appointment. Since the clinic is not a 24/7 crisis center, mental health emergency resources were shared and this  provider explained MyChart, e-mail, voicemail, and/or other messaging systems should be utilized only for non-emergency reasons. This provider also explained that information obtained during appointments will be placed in [facility] medical record and relevant information will be shared with other providers at [facility] at any locations for coordination of care. Shawna Craig agreed information may be shared with other [facility] providers as needed for coordination of care and by signing the service agreement document, she provided written consent for coordination of care. Prior to initiating telepsychological services, Shawna Craig completed an informed consent document, which included the development of a safety plan (i.e., an emergency  contact and emergency resources) in the event of an emergency/crisis. Shawna Craig verbally acknowledged understanding she is ultimately responsible for understanding her insurance benefits for telepsychological and in-person services. This provider also reviewed confidentiality, as it relates to telepsychological services. [name]  acknowledged understanding that appointments cannot be recorded without both party consent and she is aware she is responsible for securing confidentiality on her end of the session. [name] verbally consented to proceed.  Of note, today's appointment was switched to a regular telephone call at 3:15pm with [name]'s verbal consent due to technical issues.   Chief Complaint/HPI: Shawna Craig was referred by Dr. Thomasene Lot due to mood disorder-emotional eating. Per the note for the visit with Dr. Thomasene Lot on 09/21/2022, "Patient endorses eating when stressed, sad, bored, as a reward, and to help comfort herself."   During today's appointment, Shawna Craig was verbally administered a questionnaire assessing various behaviors related to emotional eating behaviors. Shawna Craig endorsed the following: overeat when you are celebrating, experience food cravings on a regular basis, eat certain foods when you are anxious, stressed, depressed, or your feelings are hurt, use food to help you cope with emotional situations, find food is comforting to you, overeat when you are angry or upset, overeat when you are worried about something, overeat frequently when you are bored or lonely, overeat when you are alone, but eat much less when you are with other people, and eat as a reward. She shared she craves "all the bad stuff, carbs." Shawna Craig believes the onset of emotional eating behaviors was likely in childhood, and described the current frequency of emotional eating behaviors as "few times a month." In addition, Shawna Craig denied a history of binge eating behaviors. Shawna Craig denied a history of  significantly restricting food intake, purging and engagement in other compensatory strategies for weight loss, and has never been diagnosed with  an eating disorder. She also denied a history of treatment for emotional eating behaviors. Currently, Shawna Craig indicated she is following her prescribed structured meal plan (Cat 2), noting challenges due to social activities. Furthermore, Shawna Craig denied other problems of concern.    Mental Status Examination:  Appearance: neat Behavior: appropriate to circumstances Mood: neutral Affect: mood congruent Speech: WNL Eye Contact: appropriate Psychomotor Activity: WNL Gait: unable to assess  Thought Process: linear, logical, and goal directed and denies suicidal, homicidal, and self-harm ideation, plan and intent  Thought Content/Perception: no hallucinations, delusions, bizarre thinking or behavior endorsed or observed Orientation: AAOx4 Memory/Concentration: memory, attention, language, and fund of knowledge intact  Insight/Judgment: fair  Family & Psychosocial History: Shawna Craig reported she is a widow, adding she is currently "seeing somebody." She further reported she has two children (ages 72 and 23). 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 Additionally, Shawna Craig shared her highest level of education obtained is a bachelor's degree. Currently, Shawna Craig's social support system consists of her parents and in laws. Moreover, Shawna Craig stated she resides with her children, dog, and cat.   Medical History:  Past Medical History:  Diagnosis Date   Anxiety    Cervical high risk human papillomavirus (HPV) DNA test positive    Depression    Genital warts    Hidradenitis suppurativa    Vaccine for human papilloma virus (HPV) types 6, 11, 16, and 18 administered    Yeast vaginitis    Past Surgical History:  Procedure Laterality Date   TONSILLECTOMY AND ADENOIDECTOMY     2007   WISDOM TOOTH EXTRACTION     Current  Outpatient Medications on File Prior to Visit  Medication Sig Dispense Refill   buPROPion (WELLBUTRIN SR) 150 MG 12 hr tablet Take 150 mg by mouth 2 (two) times daily.     cyanocobalamin (VITAMIN B12) 500 MCG tablet Take 1 tablet (500 mcg total) by mouth daily.     fluticasone (FLONASE) 50 MCG/ACT nasal spray Place 2 sprays into both nostrils daily. 16 g 6   norethindrone-ethinyl estradiol-iron (JUNEL FE 1.5/30) 1.5-30 MG-MCG tablet TAKE 1 TABLET BY MOUTH EVERY DAY 28 tablet 2   Probiotic Product (PROBIOTIC PO) Take 1 tablet by mouth daily.     No current facility-administered medications on file prior to visit.  Shawna Craig stated she is medication compliant.   Mental Health History: Shawna Craig reported a history of therapeutic services, noting she currently meets with Shawna Craig, [facility] with [facility], noting they meet every other week to address ongoing stressors. Shawna Craig agreed to sign an authorization for coordination of care if deemed necessary.  She stated her psychiatrist, Dr. Maryruth Bun, prescribes Wellbutrin and Zoloft. She indicated she meets with Dr. Maryruth Bun every few months for medication management. Shawna Craig reported there is no history of hospitalizations for psychiatric concerns. Shawna Craig denied a family history of mental health/substance abuse related concerns. Furthermore, Shawna Craig reported there is no history of trauma including psychological, physical , and sexual abuse, as well as neglect.   Shawna Craig described her typical mood lately as "up and down," adding she struggles "really, really bad with overthinking stuff." She shared she has "always worried about something," and described her worry as generalized, noting an exacerbation since her husband's passing approximately 7 years ago. Shawna Craig denied current alcohol use. She denied tobacco use. She denied illicit/recreational substance use. Furthermore, Shawna Craig indicated she is not experiencing the following: hallucinations and delusions,  paranoia, symptoms of mania , social withdrawal, crying spells, panic attacks,  memory concerns, attention and concentration issues, and obsessions and compulsions. She also denied history of and current suicidal ideation, plan, and intent; history of and current homicidal ideation, plan, and intent; and history of and current engagement in self-harm.  Legal History: Shawna Craig reported there is no history of legal involvement.   Structured Assessments Results: The Patient Health Questionnaire-9 (PHQ-9) is a self-report measure that assesses symptoms and severity of depression over the course of the last two weeks. Andriana obtained a score of 5 suggesting mild depression. Sindy finds the endorsed symptoms to be somewhat difficult. [0= Not at all; 1= Several days; 2= More than half the days; 3= Nearly every day] Little interest or pleasure in doing things 0  Feeling down, depressed, or hopeless 0  Trouble falling or staying asleep, or sleeping too much- due to ongoing worry 2  Feeling tired or having little energy-due to lack of sleep 2  Poor appetite or overeating 0  Feeling bad about yourself --- or that you are a failure or have let yourself or your family down 1  Trouble concentrating on things, such as reading the newspaper or watching television 0  Moving or speaking so slowly that other people could have noticed? Or the opposite --- being so fidgety or restless that you have been moving around a lot more than usual 0  Thoughts that you would be better off dead or hurting yourself in some way 0  PHQ-9 Score 5    The Generalized Anxiety Disorder-7 (GAD-7) is a brief self-report measure that assesses symptoms of anxiety over the course of the last two weeks. Sarae obtained a score of 11 suggesting moderate anxiety. Danajia finds the endorsed symptoms to be somewhat difficult. [0= Not at all; 1= Several days; 2= Over half the days; 3= Nearly every day] Feeling nervous, anxious, on edge 3   Not being able to stop or control worrying 3  Worrying too much about different things 3  Trouble relaxing 0  Being so restless that it's hard to sit still 0  Becoming easily annoyed or irritable 0  Feeling afraid as if something awful might happen -" Always living waiting for the other shoe to drop." 2  GAD-7 Score 11   Interventions:  Conducted a chart review Focused on rapport building Verbally administered PHQ-9 and GAD-7 for symptom monitoring Verbally administered Food & Mood questionnaire to assess various behaviors related to emotional eating Provided emphatic reflections and validation Psychoeducation provided regarding physical versus emotional hunger  Diagnostic Impressions & Provisional DSM-5 Diagnosis(es): Jacarra reported a history of engagement in emotional eating behaviors and believes the onset of emotional eating behaviors was likely in childhood. She described the current frequency of emotional eating behaviors as "few times a month." She denied engagement in any other disordered eating behaviors. Moreover, she discussed experiencing worry and being "really, really bad with overthinking stuff." She described her worry as generalized, noting an exacerbation since her husband's passing approximately 7 years ago. Yashvi also endorsed items on the administered measures. Based on the aforementioned, the following diagnoses were assigned: F50.89 Other Specified Feeding or Eating Disorder, Emotional Eating Behaviors and F41.1 Generalized Anxiety Disorder.  Plan: Sherria appears able and willing to participate as evidenced by engagement in reciprocal conversation and asking questions as needed for clarification. The next appointment is scheduled for 12/21/2022 at 2pm, which will be via MyChart Video Visit. The following treatment goal was established: increase coping skills. This provider will regularly review the treatment plan and medical  chart to keep informed of status changes.  Sydni expressed understanding and agreement with the initial treatment plan of care. Marquee will be sent a handout via e-mail to utilize between now and the next appointment to increase awareness of hunger patterns and subsequent eating. Bella provided verbal consent during today's appointment for this provider to send the handout via e-mail. Neviah will continue with her primary therapist and her psychiatric provider.

## 2022-11-23 DIAGNOSIS — F324 Major depressive disorder, single episode, in partial remission: Secondary | ICD-10-CM | POA: Diagnosis not present

## 2022-11-23 DIAGNOSIS — F411 Generalized anxiety disorder: Secondary | ICD-10-CM | POA: Diagnosis not present

## 2022-12-02 ENCOUNTER — Ambulatory Visit (INDEPENDENT_AMBULATORY_CARE_PROVIDER_SITE_OTHER): Payer: BC Managed Care – PPO | Admitting: Family Medicine

## 2022-12-06 ENCOUNTER — Ambulatory Visit (INDEPENDENT_AMBULATORY_CARE_PROVIDER_SITE_OTHER): Payer: BC Managed Care – PPO | Admitting: Family Medicine

## 2022-12-06 ENCOUNTER — Encounter (INDEPENDENT_AMBULATORY_CARE_PROVIDER_SITE_OTHER): Payer: Self-pay | Admitting: Family Medicine

## 2022-12-06 VITALS — BP 110/77 | HR 84 | Temp 98.1°F | Ht 62.5 in | Wt 171.0 lb

## 2022-12-06 DIAGNOSIS — E538 Deficiency of other specified B group vitamins: Secondary | ICD-10-CM | POA: Diagnosis not present

## 2022-12-06 DIAGNOSIS — Z6830 Body mass index (BMI) 30.0-30.9, adult: Secondary | ICD-10-CM

## 2022-12-06 DIAGNOSIS — E668 Other obesity: Secondary | ICD-10-CM

## 2022-12-06 DIAGNOSIS — R7303 Prediabetes: Secondary | ICD-10-CM

## 2022-12-06 DIAGNOSIS — E559 Vitamin D deficiency, unspecified: Secondary | ICD-10-CM

## 2022-12-06 DIAGNOSIS — E669 Obesity, unspecified: Secondary | ICD-10-CM

## 2022-12-06 NOTE — Progress Notes (Signed)
Shawna Craig, D.O.  ABFM, ABOM Specializing in Clinical Bariatric Medicine  Office located at: [address] [place], [place]  [address]     Assessment and Plan:   Vitamin D deficiency Assessment: Condition is Not at goal. Her vitamin D level is not within the recommended range of 50-80. This is diet/exercise controlled as she does not take any oral vitamin D supplements.  Lab Results  Component Value Date   VD25OH 45.0 09/21/2022   Plan: - We will re-discuss the option of vitamin D supplement, at this moment she declines need for vitamin supplement.   - Weight loss will likely improve availability of vitamin D.   - Encouraged Shawna Craig to continue with meal plan and their weight loss efforts to further improve this condition.    - We will continue to monitor levels regularly every 3-4 mo on average to keep levels within normal limits and prevent over supplementation.   B12 deficiency Assessment: Condition is Not optimized. Her B12 level below the 500+ range at 350. She continues Cyanocobalamin daily and is tolerating it well without difficulty or any adverse side effects.   Lab Results  Component Value Date   VITAMINB12 350 09/21/2022   Plan: - Continue with OTC Cyanocobalamin daily.   - Continue to follow our prudent prescribed nutritional meal plan.   - Increase foods in B12 for continued aid in her B12 levels.    Prediabetes Assessment: Condition is Not optimized. Her insulin is elevated at 9.1. Her hunger and cravings are well controlled when following the prescribed meal plan. She continues to eat/increase protein to help curb her cravings. This condition is diet/exercise controlled.  Lab Results  Component Value Date   HGBA1C 5.5 09/21/2022   HGBA1C 5.2 08/20/2022   HGBA1C 5.7 01/26/2022   INSULIN 9.1 09/21/2022   Plan: - Discussed the importance of dietary and lifestyle modifications to result in weight loss as first line txmnt.   - Continue  to decrease simple carbs/ sugars; increase fiber and proteins -> follow her meal plan.    - Anticipatory guidance given.    - Shawna Craig will continue to work on weight loss and exercise via their meal plan we devised to help decrease the risk of progressing to diabetes.   - We will recheck A1c and fasting insulin level in approximately 3 months from last check, or as deemed appropriate.    TREATMENT PLAN FOR OBESITY: Class 1 obesity with serious comorbidity and body mass index (BMI) of 32.0 to 32.9 in adult, unspecified obesity type, current BMI- 30.76 Assessment:  Shawna Craig is here to discuss her progress with her obesity treatment plan along with follow-up of her obesity related diagnoses. See Medical Weight Management Flowsheet for complete bioelectrical impedance results.  Condition is docourse: improving. Biometric data collected today, was reviewed with patient.   Since last office visit on 11/01/2022 patient's  Muscle mass has increased by 1.2lb. Fat mass has decreased by 5.4lb. Total body water has decreased by 1.4lb.  Counseling done on how various foods will affect these numbers and how to maximize success  Total lbs lost to date: 8lbs Total weight loss percentage to date: 4.47%   Plan: - Continue the Category 2 Plan with additional lunch options and be more consistent with journaling of 1150-1250 calories and 85+ protein.    - Unless pre-existing renal or cardiopulmonary conditions exist which patient was told to limit their fluid intake by another provider, I recommended roughly  one half of their weight in pounds, to be the approximate ounces of non-caloric, non-caffeinated beverages they should drink per day; including more if they are engaging in exercise.   Behavioral Intervention Additional resources provided today: patient declined Evidence-based interventions for health behavior change were utilized today including the discussion of self monitoring techniques,  problem-solving barriers and SMART goal setting techniques.   Regarding patient's less desirable eating habits and patterns, we employed the technique of small changes.  Pt will specifically work on: Increase water intake and hit  80oz daily for next visit.    Recommended Physical Activity Goals Shawna Craig has been advised to slowly work up to 150 minutes of moderate intensity aerobic activity a week and strengthening exercises 2-3 times per week for cardiovascular health, weight loss maintenance and preservation of muscle mass.   She has agreed to Continue current level of physical activity   FOLLOW UP: Return in about 4 weeks (around 01/03/2023). She was informed of the importance of frequent follow up visits to maximize her success with intensive lifestyle modifications for her multiple health conditions.  Subjective:   Chief complaint: Obesity Shawna Craig is here to discuss her progress with her obesity treatment plan. She is on the the Category 2 Plan and states she is following her eating plan approximately 50-60% of the time. She states she is walking 45-60 minutes 6-7 days per week and personal training 30-69mins 2 days a week.  Interval History:  Shawna Craig is here for a follow up office visit. I last seen her on 11/01/2022.  Since last OV she has been doing well.   - She feels as she is slowly getting better with following her meal plan.   - She has increased her walking since last time and states that it makes her feel better physically and emotionally, she has really enjoyed this.   - She notes that she has not been consistent with journaling but has been more health conscious with her food choices  - She drinks about 64oz of water daily.    Review of Systems:  Pertinent positives were addressed with patient today.  Reviewed by clinician on day of visit: allergies, medications, problem list, medical history, surgical history, family history, social history, and previous  encounter notes.  Weight Summary and Biometrics   Weight Lost Since Last Visit: 4lb  No data recorded   Vitals Temp: 98.1 F (36.7 C) BP: 110/77 Pulse Rate: 84 SpO2: 98 %   Anthropometric Measurements Height: 5' 2.5" (1.588 m) Weight: 171 lb (77.6 kg) BMI (Calculated): 30.76 Weight at Last Visit: 175lb Weight Lost Since Last Visit: 4lb Starting Weight: 179lb Total Weight Loss (lbs): 8 lb (3.629 kg) Peak Weight: 208lb   Body Composition  Body Fat %: 36.8 % Fat Mass (lbs): 63 lbs Muscle Mass (lbs): 103 lbs Total Body Water (lbs): 72.8 lbs Visceral Fat Rating : 7   Other Clinical Data Fasting: no Labs: no Today's Visit #: 4 Starting Date: 09/21/22   Objective:   PHYSICAL EXAM: Blood pressure 110/77, pulse 84, temperature 98.1 F (36.7 C), height 5' 2.5" (1.588 m), weight 171 lb (77.6 kg), SpO2 98%. Body mass index is 30.78 kg/m.  General: Well Developed, well nourished, and in no acute distress.  HEENT: Normocephalic, atraumatic Skin: Warm and dry, cap RF less 2 sec, good turgor Chest:  Normal excursion, shape, no gross abn Respiratory: speaking in full sentences, no conversational dyspnea NeuroM-Sk: Ambulates w/o assistance, moves * 4 Psych: A and O *  3, insight good, mood-full  DIAGNOSTIC DATA REVIEWED:  BMET    Component Value Date/Time   NA 141 09/21/2022 1253   K 4.0 09/21/2022 1253   CL 101 09/21/2022 1253   CO2 22 09/21/2022 1253   GLUCOSE 84 09/21/2022 1253   GLUCOSE 88 01/26/2022 1559   BUN 5 (L) 09/21/2022 1253   CREATININE 0.97 09/21/2022 1253   CALCIUM 9.6 09/21/2022 1253   Lab Results  Component Value Date   HGBA1C 5.5 09/21/2022   HGBA1C 5.7 01/26/2022   Lab Results  Component Value Date   INSULIN 9.1 09/21/2022   Lab Results  Component Value Date   TSH 1.170 09/21/2022   CBC    Component Value Date/Time   WBC 6.6 09/21/2022 1253   WBC 8.2 01/26/2022 1559   RBC 4.94 09/21/2022 1253   RBC 4.48 01/26/2022 1559   HGB  14.5 09/21/2022 1253   HCT 43.6 09/21/2022 1253   PLT 285 09/21/2022 1253   MCV 88 09/21/2022 1253   MCH 29.4 09/21/2022 1253   MCH 29.2 10/09/2015 0551   MCHC 33.3 09/21/2022 1253   MCHC 33.2 01/26/2022 1559   RDW 12.7 09/21/2022 1253   Iron Studies    Component Value Date/Time   FERRITIN 34 07/15/2015 0903   Lipid Panel     Component Value Date/Time   CHOL 174 09/21/2022 1253   TRIG 83 09/21/2022 1253   HDL 66 09/21/2022 1253   CHOLHDL 2.6 09/21/2022 1253   CHOLHDL 3 01/26/2022 1559   VLDL 21.4 01/26/2022 1559   LDLCALC 93 09/21/2022 1253   Hepatic Function Panel     Component Value Date/Time   PROT 6.9 09/21/2022 1253   ALBUMIN 4.3 09/21/2022 1253   AST 24 09/21/2022 1253   ALT 32 09/21/2022 1253   ALKPHOS 74 09/21/2022 1253   BILITOT 0.3 09/21/2022 1253      Component Value Date/Time   TSH 1.170 09/21/2022 1253   Nutritional Lab Results  Component Value Date   VD25OH 45.0 09/21/2022    Attestations:   I, Clinical biochemist, acting as a Stage manager for [employer], DO., have compiled all relevant documentation for today's office visit on behalf of Thomasene Lot, DO, while in the presence of [employer], DO.  I have reviewed the above documentation for accuracy and completeness, and I agree with the above. Shawna Craig, D.O.  The 21st Century Cures Act was signed into law in 2016 which includes the topic of electronic health records.  This provides immediate access to information in MyChart.  This includes consultation notes, operative notes, office notes, lab results and pathology reports.  If you have any questions about what you read please let us know at your next visit so we can discuss your concerns and take corrective action if need be.  We are right here with you.

## 2022-12-07 DIAGNOSIS — F324 Major depressive disorder, single episode, in partial remission: Secondary | ICD-10-CM | POA: Diagnosis not present

## 2022-12-07 DIAGNOSIS — F411 Generalized anxiety disorder: Secondary | ICD-10-CM | POA: Diagnosis not present

## 2022-12-14 DIAGNOSIS — F324 Major depressive disorder, single episode, in partial remission: Secondary | ICD-10-CM | POA: Diagnosis not present

## 2022-12-14 DIAGNOSIS — F411 Generalized anxiety disorder: Secondary | ICD-10-CM | POA: Diagnosis not present

## 2022-12-21 ENCOUNTER — Telehealth (INDEPENDENT_AMBULATORY_CARE_PROVIDER_SITE_OTHER): Payer: BC Managed Care – PPO | Admitting: Psychology

## 2022-12-26 NOTE — Progress Notes (Unsigned)
PCP:  Judy Pimple, MD   No chief complaint on file.    HPI:      [name] is a 37 y.o. [id number] whose LMP was No LMP recorded., presents today for her annual examination.  Her menses are regular every 28-30 days, lasting 3-4 days.  Dysmenorrhea mild, worse than in the past but still no meds needed. She does not have intermenstrual bleeding.  Sex activity: single partner, contraception - OCP (estrogen/progesterone). No pain/bleeding. Last Pap: 10/21/20 Results were normal/neg HPV DNA. 05/05/20 (6 mo repeat pap after colpo/bx 6/21)  Results were ASCUS/neg HPV DNA. 2020 neg per pt report; hx of HPV on pap 2019 (can't find copy of paps in chart) and 2021; CIN1 on colpo/bx with Dr. Tiburcio Pea 6/21; repeat pap due today Hx of STDs: ext genital warts, treated 9/20  Hx of recurrent yeast vag, sx much improved with addition of probiotics. 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 Was on abx recently and developed yeast vag sx; took diflucan today.  There is no FH of breast cancer. There is no FH of ovarian cancer. The patient does do self-breast exams.  Tobacco use: The patient denies current or previous tobacco use. Alcohol use: none No drug use.  Exercise: very active  She does get adequate calcium but not Vitamin D in her diet.  Past Medical History:  Diagnosis Date   Anxiety    Cervical high risk human papillomavirus (HPV) DNA test positive    Depression    Genital warts    Hidradenitis suppurativa    Vaccine for human papilloma virus (HPV) types 6, 11, 16, and 18 administered    Yeast vaginitis     Past Surgical History:  Procedure Laterality Date   TONSILLECTOMY AND ADENOIDECTOMY     2007   WISDOM TOOTH EXTRACTION      Family History  Problem Relation Age of Onset   Diabetes Mother    Obesity Mother    Diabetes Father    Breast cancer Neg Hx    Ovarian cancer Neg Hx     Social History   Socioeconomic History   Marital status: Widowed    Spouse  name: Onalee Hua   Number of children: 1   Years of education: Not on file   Highest education level: Not on file  Occupational History   Occupation: exercise teacher     Employer: [employer]  Tobacco Use   Smoking status: Never   Smokeless tobacco: Never  Vaping Use   Vaping status: Never Used  Substance and Sexual Activity   Alcohol use: No    Alcohol/week: 0.0 standard drinks of alcohol   Drug use: No   Sexual activity: Yes    Birth control/protection: Pill  Other Topics Concern   Not on file  Social History Narrative   Pt is married (Dr Karle Starch daughter in Social worker) , and has a 71 mo old daughter   Stays home and also teaches exercise classes at the [employer] on finishing a degree on line    Social Determinants of Health   Financial Resource Strain: Not on file  Food Insecurity: Not on file  Transportation Needs: Not on file  Physical Activity: Not on file  Stress: Not on file  Social Connections: Not on file  Intimate Partner Violence: Not on file     Current Outpatient Medications:    buPROPion (WELLBUTRIN SR) 150 MG 12 hr tablet, Take 150 mg by  mouth 2 (two) times daily., Disp: , Rfl:    cyanocobalamin (VITAMIN B12) 500 MCG tablet, Take 1 tablet (500 mcg total) by mouth daily., Disp: , Rfl:    fluticasone (FLONASE) 50 MCG/ACT nasal spray, Place 2 sprays into both nostrils daily., Disp: 16 g, Rfl: 6   norethindrone-ethinyl estradiol-iron (JUNEL FE 1.5/30) 1.5-30 MG-MCG tablet, TAKE 1 TABLET BY MOUTH EVERY DAY, Disp: 28 tablet, Rfl: 2   Probiotic Product (PROBIOTIC PO), Take 1 tablet by mouth daily., Disp: , Rfl:    sertraline (ZOLOFT) 50 MG tablet, Take 50 mg by mouth every morning., Disp: , Rfl:      ROS:  Review of Systems  Constitutional:  Negative for fatigue, fever and unexpected weight change.  Respiratory:  Negative for cough, shortness of breath and wheezing.   Cardiovascular:  Negative for chest pain, palpitations and leg swelling.  Gastrointestinal:  Negative  for blood in stool, constipation, diarrhea, nausea and vomiting.  Endocrine: Negative for cold intolerance, heat intolerance and polyuria.  Genitourinary:  Negative for dyspareunia, dysuria, flank pain, frequency, genital sores, hematuria, menstrual problem, pelvic pain, urgency, vaginal bleeding, vaginal discharge and vaginal pain.  Musculoskeletal:  Negative for back pain, joint swelling and myalgias.  Skin:  Negative for rash.  Neurological:  Negative for dizziness, syncope, light-headedness, numbness and headaches.  Hematological:  Negative for adenopathy.  Psychiatric/Behavioral:  Negative for agitation, confusion, dysphoric mood, sleep disturbance and suicidal ideas. The patient is not nervous/anxious.   BREAST: No symptoms   Objective: There were no vitals taken for this visit.   Physical Exam Constitutional:      Appearance: She is well-developed.  Genitourinary:     Vulva normal.     Right Labia: No rash, tenderness or lesions.    Left Labia: No tenderness, lesions or rash.    No vaginal discharge, erythema or tenderness.      Right Adnexa: not tender and no mass present.    Left Adnexa: not tender and no mass present.    No cervical friability or polyp.     Uterus is not enlarged or tender.  Breasts:    Right: No mass, nipple discharge, skin change or tenderness.     Left: No mass, nipple discharge, skin change or tenderness.  Neck:     Thyroid: No thyromegaly.  Cardiovascular:     Rate and Rhythm: Normal rate and regular rhythm.     Heart sounds: Normal heart sounds. No murmur heard. Pulmonary:     Effort: Pulmonary effort is normal.     Breath sounds: Normal breath sounds.  Abdominal:     Palpations: Abdomen is soft.     Tenderness: There is no abdominal tenderness. There is no guarding or rebound.  Musculoskeletal:        General: Normal range of motion.     Cervical back: Normal range of motion.  Lymphadenopathy:     Cervical: No cervical adenopathy.   Neurological:     General: No focal deficit present.     Mental Status: She is alert and oriented to person, place, and time.     Cranial Nerves: No cranial nerve deficit.  Skin:    General: Skin is warm and dry.  Psychiatric:        Mood and Affect: Mood normal.        Behavior: Behavior normal.        Thought Content: Thought content normal.        Judgment: Judgment normal.  Vitals reviewed.  Assessment/Plan: Encounter for annual routine gynecological examination  Cervical cancer screening - Plan: Cytology - PAP  Screening for HPV (human papillomavirus) - Plan: Cytology - PAP  Dysplasia of cervix, low grade (CIN 1) - Plan: Cytology - PAP; repeat today. Will f/u with results.   Encounter for surveillance of contraceptive pills - Plan: norethindrone-ethinyl estradiol-iron (LOESTRIN FE) 1.5-30 MG-MCG tablet; OCP RF  Yeast vaginitis--sx improved with probiotic use. F/u prn.    No orders of the defined types were placed in this encounter.            GYN counsel adequate intake of calcium and vitamin D, diet and exercise     F/U  No follow-ups on file.  Gabriell Casimir B. Zadkiel Dragan, PA-C 12/26/2022 9:10 PM

## 2022-12-27 ENCOUNTER — Ambulatory Visit (INDEPENDENT_AMBULATORY_CARE_PROVIDER_SITE_OTHER): Payer: BC Managed Care – PPO | Admitting: Obstetrics and Gynecology

## 2022-12-27 ENCOUNTER — Other Ambulatory Visit (HOSPITAL_COMMUNITY)
Admission: RE | Admit: 2022-12-27 | Discharge: 2022-12-27 | Disposition: A | Discharge status: Home / Self Care | Payer: BC Managed Care – PPO | Source: Ambulatory Visit | Attending: Obstetrics and Gynecology | Admitting: Obstetrics and Gynecology

## 2022-12-27 ENCOUNTER — Encounter: Payer: Self-pay | Admitting: Obstetrics and Gynecology

## 2022-12-27 VITALS — BP 114/60 | Ht 62.0 in | Wt 175.0 lb

## 2022-12-27 DIAGNOSIS — Z1151 Encounter for screening for human papillomavirus (HPV): Secondary | ICD-10-CM | POA: Insufficient documentation

## 2022-12-27 DIAGNOSIS — Z01419 Encounter for gynecological examination (general) (routine) without abnormal findings: Secondary | ICD-10-CM | POA: Diagnosis not present

## 2022-12-27 DIAGNOSIS — F324 Major depressive disorder, single episode, in partial remission: Secondary | ICD-10-CM | POA: Diagnosis not present

## 2022-12-27 DIAGNOSIS — Z124 Encounter for screening for malignant neoplasm of cervix: Secondary | ICD-10-CM | POA: Insufficient documentation

## 2022-12-27 DIAGNOSIS — N870 Mild cervical dysplasia: Secondary | ICD-10-CM | POA: Insufficient documentation

## 2022-12-27 DIAGNOSIS — F411 Generalized anxiety disorder: Secondary | ICD-10-CM | POA: Diagnosis not present

## 2022-12-27 DIAGNOSIS — Z3041 Encounter for surveillance of contraceptive pills: Secondary | ICD-10-CM

## 2022-12-27 MED ORDER — NORETHIN ACE-ETH ESTRAD-FE 1.5-30 MG-MCG PO TABS
1.0000 | ORAL_TABLET | Freq: Every day | ORAL | 3 refills | Status: DC
Start: 2022-12-27 — End: 2023-12-05

## 2022-12-27 NOTE — Patient Instructions (Signed)
I value your feedback and you entrusting us with your care. If you get a [facility] patient survey, I would appreciate you taking the time to let us know about your experience today. Thank you! ? ? ?

## 2022-12-30 ENCOUNTER — Encounter (INDEPENDENT_AMBULATORY_CARE_PROVIDER_SITE_OTHER): Payer: Self-pay

## 2023-01-04 ENCOUNTER — Ambulatory Visit (INDEPENDENT_AMBULATORY_CARE_PROVIDER_SITE_OTHER): Payer: BC Managed Care – PPO | Admitting: Family Medicine

## 2023-01-04 ENCOUNTER — Encounter (INDEPENDENT_AMBULATORY_CARE_PROVIDER_SITE_OTHER): Payer: Self-pay | Admitting: Family Medicine

## 2023-01-04 VITALS — BP 115/78 | HR 84 | Temp 98.2°F | Ht 62.0 in | Wt 169.0 lb

## 2023-01-04 DIAGNOSIS — E538 Deficiency of other specified B group vitamins: Secondary | ICD-10-CM | POA: Diagnosis not present

## 2023-01-04 DIAGNOSIS — R7303 Prediabetes: Secondary | ICD-10-CM | POA: Diagnosis not present

## 2023-01-04 DIAGNOSIS — E669 Obesity, unspecified: Secondary | ICD-10-CM

## 2023-01-04 DIAGNOSIS — E559 Vitamin D deficiency, unspecified: Secondary | ICD-10-CM

## 2023-01-04 DIAGNOSIS — E668 Other obesity: Secondary | ICD-10-CM

## 2023-01-04 DIAGNOSIS — Z6830 Body mass index (BMI) 30.0-30.9, adult: Secondary | ICD-10-CM

## 2023-01-04 MED ORDER — VITAMIN D3 50 MCG (2000 UT) PO CAPS
2000.0000 [IU] | ORAL_CAPSULE | Freq: Every day | ORAL | Status: DC
Start: 2023-01-04 — End: 2023-03-07

## 2023-01-04 MED ORDER — CYANOCOBALAMIN 500 MCG PO TABS: 500.0000 ug | ORAL_TABLET | Freq: Every day | ORAL | Status: DC

## 2023-01-04 NOTE — Progress Notes (Signed)
[name], D.O.  ABFM, ABOM Specializing in Clinical Bariatric Medicine  Office located at: [address] [place], [place]  [address]     Assessment and Plan:   Orders Placed This Encounter  Procedures   Vitamin B12   VITAMIN D 25 Hydroxy (Vit-D Deficiency, Fractures)   Hemoglobin A1c   Insulin, random    Medications Discontinued During This Encounter  Medication Reason   cyanocobalamin (VITAMIN B12) 500 MCG tablet Reorder     Meds ordered this encounter  Medications   Cholecalciferol (VITAMIN D3) 50 MCG (2000 UT) capsule    Sig: Take 1 capsule (2,000 Units total) by mouth daily.   cyanocobalamin (VITAMIN B12) 500 MCG tablet    Sig: Take 1 tablet (500 mcg total) by mouth daily.   Come 1 wk prior to next OV for labs   Prediabetes Assessment & Plan: No current meds. Diet/exercise approach. Reports that her hunger and cravings are well controlled. No concerns.   Lab Results  Component Value Date   HGBA1C 5.5 09/21/2022   HGBA1C 5.2 08/20/2022   HGBA1C 5.7 01/26/2022   INSULIN 9.1 09/21/2022    Continue her prudent nutritional plan that is low in simple carbohydrates, saturated fats and trans fats to goal of 5-10% weight loss to achieve significant health benefits.  Will review A1c and fasting insulin results at next OV.    Vitamin D deficiency Assessment & Plan: Pt endorses that her GYN recommended her to take an OTC Vitamin D - pt has not started any supplementation because does not recall what dosage was suggested.   Lab Results  Component Value Date   VD25OH 45.0 09/21/2022   Start OTC Cholecalciferol 2,000 units daily. Will review results from Vitamin D labs next OV.   B12 deficiency Assessment & Plan: No issues with OTC cyanocobalamin 500 mcg daily, tolerating well.   Lab Results  Component Value Date   VITAMINB12 350 09/21/2022   Continue with treatment plan above and their B12 rich diet plan. Will go over results from Vitamin B12  labs next OV.   TREATMENT PLAN FOR OBESITY: Class 1 obesity with serious comorbidity and body mass index (BMI) of 32.0 to 32.9 in adult, unspecified obesity type, current BMI- 30.76 Assessment & Plan: Shawna Craig is here to discuss her progress with her obesity treatment plan along with follow-up of her obesity related diagnoses. See Medical Weight Management Flowsheet for complete bioelectrical impedance results.  Since last office visit on 12/06/22 patient's muscle mass has increased by 1 lb. Fat mass has decreased by 3.4 lb. Total body water has increased by 1.4 lb.  Counseling done on how various foods will affect these numbers and how to maximize success  Total lbs lost to date: 5 lbs  Total weight loss percentage to date: 2.87  No change to meal plan - see Subjective    Behavioral Intervention Additional resources provided today: patient declined Evidence-based interventions for health behavior change were utilized today including the discussion of self monitoring techniques, problem-solving barriers and SMART goal setting techniques.   Regarding patient's less desirable eating habits and patterns, we employed the technique of small changes.  Pt will specifically work on: strength training 3 days a wk and cut back caffeine intake to half for next visit.    FOLLOW UP: Return 4-6 wks. She was informed of the importance of frequent follow up visits to maximize her success with intensive lifestyle modifications for her multiple health conditions.  Subjective:   Chief complaint: Obesity Shawna Craig is here to discuss her progress with her obesity treatment plan. She is on the Category 2 Plan and keeping a food journal and adhering to recommended goals of 1150-1250 calories and 85+ protein and states she is following her eating plan approximately 25-30% of the time. She states she is walking with some jogging 60 minutes, 7 days a wk and strength training 30-45 minutes, 2-3 days a wk.    Interval History:  Shawna Craig is here for a follow up office visit. Reports not following the meal plan very closely since last OV because she wanted to relax & enjoy the summer. Has not been journaling consistently either. Did not increase water intake to 80 ounces daily. Endorses drinking more regular sodas lately.   Pharmacotherapy for weight loss: She is currently taking  [name]   for weight loss.  Denies side effects.    Review of Systems:  Pertinent positives were addressed with patient today.  Reviewed by clinician on day of visit: allergies, medications, problem list, medical history, surgical history, family history, social history, and previous encounter notes.  Weight Summary and Biometrics   Weight Lost Since Last Visit: 2lb  Weight Gained Since Last Visit: 0lb   Vitals Temp: 98.2 F (36.8 C) BP: 115/78 Pulse Rate: 84 SpO2: 98 %   Anthropometric Measurements Height: 5\' 2"  (1.575 m) Weight: 169 lb (76.7 kg) BMI (Calculated): 30.9 Weight at Last Visit: 171lb Weight Lost Since Last Visit: 2lb Weight Gained Since Last Visit: 0lb Starting Weight: 179lb Total Weight Loss (lbs): 10 lb (4.536 kg) Peak Weight: 208lb   Body Composition  Body Fat %: 35.2 % Fat Mass (lbs): 59.6 lbs Muscle Mass (lbs): 104 lbs Total Body Water (lbs): 74.2 lbs Visceral Fat Rating : 6   Other Clinical Data Fasting: no Labs: no Today's Visit #: 5 Starting Date: 09/21/22    Objective:   PHYSICAL EXAM: Blood pressure 115/78, pulse 84, temperature 98.2 F (36.8 C), height 5\' 2"  (1.575 m), weight 169 lb (76.7 kg), last menstrual period 11/29/2022, SpO2 98%. Body mass index is 30.91 kg/m.  General: Well Developed, well nourished, and in no acute distress.  HEENT: Normocephalic, atraumatic Skin: Warm and dry, cap RF less 2 sec, good turgor Chest:  Normal excursion, shape, no gross abn Respiratory: speaking in full sentences, no conversational  dyspnea NeuroM-Sk: Ambulates w/o assistance, moves * 4 Psych: A and O *3, insight good, mood-full  DIAGNOSTIC DATA REVIEWED:  BMET    Component Value Date/Time   NA 141 09/21/2022 1253   K 4.0 09/21/2022 1253   CL 101 09/21/2022 1253   CO2 22 09/21/2022 1253   GLUCOSE 84 09/21/2022 1253   GLUCOSE 88 01/26/2022 1559   BUN 5 (L) 09/21/2022 1253   CREATININE 0.97 09/21/2022 1253   CALCIUM 9.6 09/21/2022 1253   Lab Results  Component Value Date   HGBA1C 5.5 09/21/2022   HGBA1C 5.7 01/26/2022   Lab Results  Component Value Date   INSULIN 9.1 09/21/2022   Lab Results  Component Value Date   TSH 1.170 09/21/2022   CBC    Component Value Date/Time   WBC 6.6 09/21/2022 1253   WBC 8.2 01/26/2022 1559   RBC 4.94 09/21/2022 1253   RBC 4.48 01/26/2022 1559   HGB 14.5 09/21/2022 1253   HCT 43.6 09/21/2022 1253   PLT 285 09/21/2022 1253   MCV 88 09/21/2022 1253   MCH 29.4 09/21/2022  1253   MCH 29.2 10/09/2015 0551   MCHC 33.3 09/21/2022 1253   MCHC 33.2 01/26/2022 1559   RDW 12.7 09/21/2022 1253   Iron Studies    Component Value Date/Time   FERRITIN 34 07/15/2015 0903   Lipid Panel     Component Value Date/Time   CHOL 174 09/21/2022 1253   TRIG 83 09/21/2022 1253   HDL 66 09/21/2022 1253   CHOLHDL 2.6 09/21/2022 1253   CHOLHDL 3 01/26/2022 1559   VLDL 21.4 01/26/2022 1559   LDLCALC 93 09/21/2022 1253   Hepatic Function Panel     Component Value Date/Time   PROT 6.9 09/21/2022 1253   ALBUMIN 4.3 09/21/2022 1253   AST 24 09/21/2022 1253   ALT 32 09/21/2022 1253   ALKPHOS 74 09/21/2022 1253   BILITOT 0.3 09/21/2022 1253      Component Value Date/Time   TSH 1.170 09/21/2022 1253   Nutritional Lab Results  Component Value Date   VD25OH 45.0 09/21/2022    Attestations:   I, Special Puri , acting as a Stage manager for [employer], DO., have compiled all relevant documentation for today's office visit on behalf of Thomasene Lot, DO, while in the  presence of [employer], DO.  I have reviewed the above documentation for accuracy and completeness, and I agree with the above. [name], D.O.  The 21st Century Cures Act was signed into law in 2016 which includes the topic of electronic health records.  This provides immediate access to information in MyChart.  This includes consultation notes, operative notes, office notes, lab results and pathology reports.  If you have any questions about what you read please let us know at your next visit so we can discuss your concerns and take corrective action if need be.  We are right here with you.

## 2023-01-10 ENCOUNTER — Telehealth (INDEPENDENT_AMBULATORY_CARE_PROVIDER_SITE_OTHER): Payer: Self-pay | Admitting: Psychology

## 2023-01-10 ENCOUNTER — Ambulatory Visit
Admission: EM | Admit: 2023-01-10 | Discharge: 2023-01-10 | Disposition: A | Discharge status: Home / Self Care | Payer: BC Managed Care – PPO | Attending: Emergency Medicine | Admitting: Emergency Medicine

## 2023-01-10 DIAGNOSIS — U071 COVID-19: Secondary | ICD-10-CM | POA: Diagnosis not present

## 2023-01-10 DIAGNOSIS — J069 Acute upper respiratory infection, unspecified: Secondary | ICD-10-CM

## 2023-01-10 DIAGNOSIS — R0981 Nasal congestion: Secondary | ICD-10-CM | POA: Diagnosis not present

## 2023-01-10 NOTE — ED Provider Notes (Signed)
Renaldo Fiddler    CSN: [id number] Arrival date & time: 01/10/23  0802      History   Chief Complaint Chief Complaint  Patient presents with   Generalized Body Aches   Nasal Congestion   Sore Throat    HPI Shawna Craig is a 37 y.o. female.   Patient presents for evaluation of bodyaches, nasal congestion, rhinorrhea, sore throat, intermittent generalized headaches, facial pain along the bilateral jaws and a mild nonproductive cough present for 1 day.  Decreased appetite but tolerating food and liquids.  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  Has been managing with 400 mg of ibuprofen.  Denies respiratory history, non-smoker.  Denies shortness of breath and wheezing.  Past Medical History:  Diagnosis Date   Anxiety    Cervical high risk human papillomavirus (HPV) DNA test positive    Depression    Genital warts    Hidradenitis suppurativa    Vaccine for human papilloma virus (HPV) types 6, 11, 16, and 18 administered    Yeast vaginitis     Patient Active Problem List   Diagnosis Date Noted   Ganglion cyst 06/18/2022   Prediabetes 01/27/2022   Family history of diabetes mellitus 01/26/2022   Left groin pain 01/26/2022   Dysplasia of cervix, low grade (CIN 1) 05/05/2020   Cervical high risk human papillomavirus (HPV) DNA test positive    Genital warts 01/16/2019   Vaginal burning 01/16/2019   Routine general medical examination at a health care facility 10/17/2018   Anxiety and depression 04/12/2016   Hidradenitis suppurativa 02/03/2016   BMI 35.0-35.9,adult 01/27/2016   Labor and delivery indication for care or intervention 10/08/2015   PROM (premature rupture of membranes) 10/08/2015   Normal vaginal delivery 10/08/2015   Fatigue 07/15/2015    Past Surgical History:  Procedure Laterality Date   TONSILLECTOMY AND ADENOIDECTOMY     2007   WISDOM TOOTH EXTRACTION      OB History     Gravida   2   Para  2   Term  2   Preterm  0   AB  0   Living  2      SAB  0   IAB  0   Ectopic  0   Multiple  0   Live Births  2            Home Medications    Prior to Admission medications   Medication Sig Start Date End Date Taking? Authorizing Provider  buPROPion (WELLBUTRIN SR) 150 MG 12 hr tablet Take 150 mg by mouth 2 (two) times daily. 11/16/21   [provider]  Cholecalciferol (VITAMIN D3) 50 MCG (2000 UT) capsule Take 1 capsule (2,000 Units total) by mouth daily. 01/04/23   Thomasene Lot, DO  cyanocobalamin (VITAMIN B12) 500 MCG tablet Take 1 tablet (500 mcg total) by mouth daily. 01/04/23   Opalski, Deborah, DO  fluticasone (FLONASE) 50 MCG/ACT nasal spray Place 2 sprays into both nostrils daily. 06/28/22   Tower, Audrie Gallus, MD  norethindrone-ethinyl estradiol-iron (JUNEL FE 1.5/30) 1.5-30 MG-MCG tablet Take 1 tablet by mouth daily. 12/27/22   Copland, Ilona Sorrel, PA-C  Probiotic Product (PROBIOTIC PO) Take 1 tablet by mouth daily.    [provider]  sertraline (ZOLOFT) 50 MG tablet Take 50 mg by mouth every morning. 11/23/22   [provider]    Family History Family History  Problem Relation Age  of Onset   Diabetes Mother    Obesity Mother    Diabetes Father    Breast cancer Neg Hx    Ovarian cancer Neg Hx     Social History Social History   Tobacco Use   Smoking status: Never   Smokeless tobacco: Never  Vaping Use   Vaping status: Never Used  Substance Use Topics   Alcohol use: No    Alcohol/week: 0.0 standard drinks of alcohol   Drug use: No     Allergies   Patient has no known allergies.   Review of Systems Review of Systems   Physical Exam Triage Vital Signs ED Triage Vitals  Encounter Vitals Group     BP 01/10/23 0808 119/84     Systolic BP Percentile --      Diastolic BP Percentile --      Pulse Rate 01/10/23 0808 90     Resp 01/10/23 0808 18     Temp 01/10/23 0808 98.7 F (37.1 C)     Temp src --       SpO2 01/10/23 0808 98 %     Weight --      Height --      Head Circumference --      Peak Flow --      Pain Score 01/10/23 0805 7     Pain Loc --      Pain Education --      Exclude from Growth Chart --    No data found.  Updated Vital Signs BP 119/84   Pulse 90   Temp 98.7 F (37.1 C)   Resp 18   LMP 01/03/2023 (Approximate)   SpO2 98%   Visual Acuity Right Eye Distance:   Left Eye Distance:   Bilateral Distance:    Right Eye Near:   Left Eye Near:    Bilateral Near:     Physical Exam Constitutional:      Appearance: Normal appearance. She is well-developed.  HENT:     Head: Normocephalic.     Right Ear: Tympanic membrane, ear canal and external ear normal.     Left Ear: Tympanic membrane, ear canal and external ear normal.     Nose: Congestion and rhinorrhea present.     Mouth/Throat:     Mouth: Mucous membranes are moist.     Pharynx: No posterior oropharyngeal erythema.     Tonsils: No tonsillar exudate. 0 on the right. 0 on the left.  Eyes:     Extraocular Movements: Extraocular movements intact.  Cardiovascular:     Rate and Rhythm: Normal rate and regular rhythm.     Pulses: Normal pulses.     Heart sounds: Normal heart sounds.  Pulmonary:     Effort: Pulmonary effort is normal.     Breath sounds: Normal breath sounds.  Musculoskeletal:     Cervical back: Normal range of motion and neck supple.  Skin:    General: Skin is warm and dry.  Neurological:     Mental Status: She is alert and oriented to person, place, and time. Mental status is at baseline.      UC Treatments / Results  Labs (all labs ordered are listed, but only abnormal results are displayed) Labs Reviewed  SARS CORONAVIRUS 2 (TAT 6-24 HRS)    EKG   Radiology No results found.  Procedures Procedures (including critical care time)  Medications Ordered in UC Medications - No data to display  Initial Impression / Assessment and Plan /  UC Course  I have reviewed the  triage vital signs and the nursing notes.  Pertinent labs & imaging results that were available during my care of the patient were reviewed by me and considered in my medical decision making (see chart for details).  Viral URI with cough  Patient is in no signs of distress nor toxic appearing.  Vital signs are stable.  Low suspicion for pneumonia, pneumothorax or bronchitis and therefore will defer imaging.COVID test is pending, reviewed quarantine guidelines per CDC recommendations minimal comorbidities, does not qualify for antiviral. May use additional over-the-counter medications as needed for supportive care.  May follow-up with urgent care as needed if symptoms persist or worsen.  Note given.   Final Clinical Impressions(s) / UC Diagnoses   Final diagnoses:  Viral URI with cough     Discharge Instructions      Your symptoms today are most likely being caused by a virus and should steadily improve in time it can take up to 7 to 10 days before you truly start to see a turnaround however things will get better  COVID test is pending up to 24 hours, you will be notified of positive test results only, if positive you will need to stay home and quarantine if experiencing fever, if no fever you may return to activity wearing mask until symptoms have gone away     You can take Tylenol and/or Ibuprofen as needed for fever reduction and pain relief.   For cough: honey 1/2 to 1 teaspoon (you can dilute the honey in water or another fluid).  You can also use guaifenesin and dextromethorphan for cough. You can use a humidifier for chest congestion and cough.  If you don't have a humidifier, you can sit in the bathroom with the hot shower running.      For sore throat: try warm salt water gargles, cepacol lozenges, throat spray, warm tea or water with lemon/honey, popsicles or ice, or OTC cold relief medicine for throat discomfort.   For congestion: take a daily anti-histamine like Zyrtec,  Claritin, and a oral decongestant, such as pseudoephedrine.  You can also use Flonase 1-2 sprays in each nostril daily.   It is important to stay hydrated: drink plenty of fluids (water, gatorade/powerade/pedialyte, juices, or teas) to keep your throat moisturized and help further relieve irritation/discomfort.    ED Prescriptions   None    PDMP not reviewed this encounter.   Valinda Hoar, [place] 01/10/23 [phone]

## 2023-01-10 NOTE — ED Triage Notes (Signed)
Patient to Urgent Care with complaints of headaches/ nasal congestion/ sore throat. Max temp 99.6.  Symptoms started yesterday. Negative home Covid test. Has been taking tylenol and ibuprofen.

## 2023-01-10 NOTE — Discharge Instructions (Signed)
Your symptoms today are most likely being caused by a virus and should steadily improve in time it can take up to 7 to 10 days before you truly start to see a turnaround however things will get better  COVID test is pending up to 24 hours, you will be notified of positive test results only, if positive you will need to stay home and quarantine if experiencing fever, if no fever you may return to activity wearing mask until symptoms have gone away     You can take Tylenol and/or Ibuprofen as needed for fever reduction and pain relief.   For cough: honey 1/2 to 1 teaspoon (you can dilute the honey in water or another fluid).  You can also use guaifenesin and dextromethorphan for cough. You can use a humidifier for chest congestion and cough.  If you don't have a humidifier, you can sit in the bathroom with the hot shower running.      For sore throat: try warm salt water gargles, cepacol lozenges, throat spray, warm tea or water with lemon/honey, popsicles or ice, or OTC cold relief medicine for throat discomfort.   For congestion: take a daily anti-histamine like Zyrtec, Claritin, and a oral decongestant, such as pseudoephedrine.  You can also use Flonase 1-2 sprays in each nostril daily.   It is important to stay hydrated: drink plenty of fluids (water, gatorade/powerade/pedialyte, juices, or teas) to keep your throat moisturized and help further relieve irritation/discomfort.

## 2023-01-10 NOTE — Telephone Encounter (Signed)
  Office: [phone]  /  Fax: [phone]  Date of Call: January 10, 2023  Time of Call: 9:05am Duration of Call: 2.5 minute(s) Provider: Lawerance Cruel, PsyD  CONTENT:  This provider called Shawna Craig to check-in and schedule a follow-up appointment. She shared she has COVID-19. Shawna Craig expressed a desire to reschedule the last canceled appointment. No evidence or endorsement of any safety concerns. All questions/concerns addressed.   PLAN:  Shawna Craig is scheduled for an appointment on 02/08/2023 at 3pm via MyChart Video Visit.

## 2023-01-11 LAB — SARS CORONAVIRUS 2 (TAT 6-24 HRS): SARS Coronavirus 2: POSITIVE — AB

## 2023-01-23 ENCOUNTER — Telehealth: Payer: Self-pay | Admitting: Family Medicine

## 2023-01-23 DIAGNOSIS — Z0000 Encounter for general adult medical examination without abnormal findings: Secondary | ICD-10-CM

## 2023-01-23 DIAGNOSIS — Z131 Encounter for screening for diabetes mellitus: Secondary | ICD-10-CM | POA: Insufficient documentation

## 2023-01-23 NOTE — Telephone Encounter (Signed)
-----   Message from Alvina Chou sent at 01/11/2023 11:10 AM EDT ----- Regarding: Lab orders for Monday, 9.9.24 Patient is scheduled for CPX labs, please order future labs, Thanks , Camelia Eng

## 2023-01-24 ENCOUNTER — Other Ambulatory Visit: Payer: BC Managed Care – PPO

## 2023-01-26 DIAGNOSIS — F411 Generalized anxiety disorder: Secondary | ICD-10-CM | POA: Diagnosis not present

## 2023-01-26 DIAGNOSIS — F324 Major depressive disorder, single episode, in partial remission: Secondary | ICD-10-CM | POA: Diagnosis not present

## 2023-01-29 DIAGNOSIS — F331 Major depressive disorder, recurrent, moderate: Secondary | ICD-10-CM | POA: Diagnosis not present

## 2023-01-29 DIAGNOSIS — F411 Generalized anxiety disorder: Secondary | ICD-10-CM | POA: Diagnosis not present

## 2023-01-31 ENCOUNTER — Encounter: Payer: BC Managed Care – PPO | Admitting: Family Medicine

## 2023-02-08 ENCOUNTER — Telehealth (INDEPENDENT_AMBULATORY_CARE_PROVIDER_SITE_OTHER): Payer: BC Managed Care – PPO | Admitting: Psychology

## 2023-02-08 DIAGNOSIS — F5089 Other specified eating disorder: Secondary | ICD-10-CM | POA: Diagnosis not present

## 2023-02-08 DIAGNOSIS — F411 Generalized anxiety disorder: Secondary | ICD-10-CM | POA: Diagnosis not present

## 2023-02-08 NOTE — Progress Notes (Signed)
  Office: [phone]  /  Fax: [phone]    Date: February 08, 2023  Appointment Start Time: 3:40pm Duration: 23 minutes Provider: Lawerance Cruel, Psy.D. Type of Session: Individual Therapy  Location of Patient: Home (private location) Location of Provider: Provider's Home (private office) Type of Contact: Telepsychological Visit via MyChart Video Visit  Session Content: Shawna Craig is a 37 y.o. female presenting for a follow-up appointment to address the previously established treatment goal of increasing coping skills.Today's appointment was a telepsychological visit. Kynzee provided verbal consent for today's telepsychological appointment and she is aware she is responsible for securing confidentiality on her end of the session. Prior to proceeding with today's appointment, Shandora's physical location at the time of this appointment was obtained as well a phone number she could be reached at in the event of technical difficulties. Addalyne and this provider participated in today's telepsychological service.   This provider conducted a brief check-in. Karina shared, "I think I'm doing really well" as it relates to her eating habits. Reviewed emotional and physical hunger. Psychoeducation regarding triggers for emotional eating was provided. Tereka was provided a handout, and encouraged to utilize the handout between now and the next appointment to increase awareness of triggers and frequency. Solomia agreed. This provider also discussed behavioral strategies for specific triggers, such as placing the utensil down when conversing to avoid mindless eating. Baker provided verbal consent during today's appointment for this provider to send a handout about triggers via e-mail. Overall, Ryllie was receptive to today's appointment as evidenced by openness to sharing, responsiveness to feedback, and willingness to explore triggers for emotional eating.  Mental Status Examination:   Appearance: neat Behavior: appropriate to circumstances Mood: neutral Affect: mood congruent Speech: WNL Eye Contact: appropriate Psychomotor Activity: WNL Gait: unable to assess Thought Process: linear, logical, and goal directed and no evidence or endorsement of suicidal, homicidal, and self-harm ideation, plan and intent  Thought Content/Perception: no hallucinations, delusions, bizarre thinking or behavior endorsed or observed Orientation: AAOx4 Memory/Concentration: intact Insight: fair Judgment: fair  Interventions:  Conducted a brief chart review Provided empathic reflections and validation Employed supportive psychotherapy interventions to facilitate reduced distress and to improve coping skills with identified stressors Psychoeducation provided regarding triggers for emotional eating behaviors  DSM-5 Diagnosis(es):  F50.89 Other Specified Feeding or Eating Disorder, Emotional Eating Behaviors and F41.1 Generalized Anxiety Disorder  Treatment Goal & Progress: During the initial appointment with this provider, the following treatment goal was established: increase coping skills. Progress is limited, as Natishia has just begun treatment with this provider; however, she is receptive to the interaction and interventions and rapport is being established.   Plan: The next appointment is scheduled for 03/08/2023 at 4pm, which will be via MyChart Video Visit. The next session will focus on working towards the established treatment goal.

## 2023-02-16 ENCOUNTER — Ambulatory Visit (INDEPENDENT_AMBULATORY_CARE_PROVIDER_SITE_OTHER): Payer: BC Managed Care – PPO | Admitting: Family Medicine

## 2023-02-21 ENCOUNTER — Encounter: Payer: Self-pay | Admitting: Family Medicine

## 2023-03-02 DIAGNOSIS — E538 Deficiency of other specified B group vitamins: Secondary | ICD-10-CM | POA: Diagnosis not present

## 2023-03-02 DIAGNOSIS — E559 Vitamin D deficiency, unspecified: Secondary | ICD-10-CM | POA: Diagnosis not present

## 2023-03-02 DIAGNOSIS — R7303 Prediabetes: Secondary | ICD-10-CM | POA: Diagnosis not present

## 2023-03-03 LAB — VITAMIN B12: Vitamin B-12: 509 pg/mL (ref 232–1245)

## 2023-03-03 LAB — HEMOGLOBIN A1C
Est. average glucose Bld gHb Est-mCnc: 111 mg/dL
Hgb A1c MFr Bld: 5.5 % (ref 4.8–5.6)

## 2023-03-03 LAB — INSULIN, RANDOM: INSULIN: 8.5 u[IU]/mL (ref 2.6–24.9)

## 2023-03-03 LAB — VITAMIN D 25 HYDROXY (VIT D DEFICIENCY, FRACTURES): Vit D, 25-Hydroxy: 51.6 ng/mL (ref 30.0–100.0)

## 2023-03-07 ENCOUNTER — Encounter (INDEPENDENT_AMBULATORY_CARE_PROVIDER_SITE_OTHER): Payer: Self-pay | Admitting: Family Medicine

## 2023-03-07 ENCOUNTER — Ambulatory Visit (INDEPENDENT_AMBULATORY_CARE_PROVIDER_SITE_OTHER): Payer: BC Managed Care – PPO | Admitting: Family Medicine

## 2023-03-07 VITALS — BP 110/72 | HR 95 | Temp 98.3°F | Ht 62.0 in | Wt 179.0 lb

## 2023-03-07 DIAGNOSIS — F39 Unspecified mood [affective] disorder: Secondary | ICD-10-CM | POA: Diagnosis not present

## 2023-03-07 DIAGNOSIS — R7303 Prediabetes: Secondary | ICD-10-CM | POA: Diagnosis not present

## 2023-03-07 DIAGNOSIS — Z6832 Body mass index (BMI) 32.0-32.9, adult: Secondary | ICD-10-CM

## 2023-03-07 DIAGNOSIS — E559 Vitamin D deficiency, unspecified: Secondary | ICD-10-CM

## 2023-03-07 DIAGNOSIS — E538 Deficiency of other specified B group vitamins: Secondary | ICD-10-CM

## 2023-03-07 DIAGNOSIS — E66811 Obesity, class 1: Secondary | ICD-10-CM

## 2023-03-07 MED ORDER — CYANOCOBALAMIN 500 MCG PO TABS
500.0000 ug | ORAL_TABLET | Freq: Every day | ORAL | Status: DC
Start: 2023-03-07 — End: 2024-02-28

## 2023-03-07 MED ORDER — METFORMIN HCL 500 MG PO TABS
ORAL_TABLET | ORAL | 0 refills | Status: DC
Start: 2023-03-07 — End: 2023-07-25

## 2023-03-07 MED ORDER — VITAMIN D3 25 MCG (1000 UT) PO CAPS
1000.0000 [IU] | ORAL_CAPSULE | Freq: Every day | ORAL | Status: DC
Start: 2023-03-07 — End: 2023-07-25

## 2023-03-07 NOTE — Progress Notes (Signed)
Shawna Craig, D.O.  ABFM, ABOM Specializing in Clinical Bariatric Medicine  Office located at: [address] [place], [place]  [address]     Assessment and Plan:   Medications Discontinued During This Encounter  Medication Reason   Cholecalciferol (VITAMIN D3) 50 MCG (2000 UT) capsule Discontinued by provider   Cholecalciferol (VITAMIN D3) 50 MCG (2000 UT) capsule Discontinued by provider   cyanocobalamin (VITAMIN B12) 500 MCG tablet Reorder    Meds ordered this encounter  Medications   metFORMIN (GLUCOPHAGE) 500 MG tablet    Sig: 1 po with lunch daily    Dispense:  30 tablet    Refill:  0    30 d supply;  ** OV for RF **   Do not send RF request   Cholecalciferol (VITAMIN D3) 25 MCG (1000 UT) CAPS    Sig: Take 1 capsule (1,000 Units total) by mouth daily. In wintertime   cyanocobalamin (VITAMIN B12) 500 MCG tablet    Sig: Take 1 tablet (500 mcg total) by mouth daily.  Prediabetes Assessment: Condition is Not at goal.. This is diet/exercise controlled. She was never previously on Metformin. Pt A1c has stayed stable at 5.5 and her insulin has improved from 9.1 to 8.5 as of 03/02/2023. She endorses having carb cravings and hunger mainly in late afternoon.  Lab Results  Component Value Date   HGBA1C 5.5 03/02/2023   HGBA1C 5.5 09/21/2022   HGBA1C 5.2 08/20/2022   INSULIN 8.5 03/02/2023   INSULIN 9.1 09/21/2022    Plan: - Begin Metformin 500mg  once daily. She is to start with half a tablet a day for 2-3 days and if tolerated increase to 1 full tablet.    - In addition, we discussed the risks and benefits of various medication options such as Metformin which can help [place] in the management of this disease process as well as with weight loss.  Will consider starting one of these meds in future as we will focus on prudent nutritional plan at this time.   - Continue to decrease simple carbs/ sugars; increase fiber and proteins -> follow her meal plan.    - Handouts  provided at pt's request after education provided.  All concerns/questions addressed  - Anticipatory guidance given.    - Simrun will continue to work on weight loss, exercise, via their meal plan we devised to help decrease the risk of progressing to diabetes.   Labs were reviewed with patient today and education provided on them. We discussed how the foods patient eats may influence these laboratory findings.  All of the patient's questions about them were answered    Vitamin D deficiency Assessment: Condition is Controlled.. Pt vitamin D levels have improved from 45.0 to 51.6. She does not take a vitamin D supplement as she has been outside in the sun a lot.  Lab Results  Component Value Date   VD25OH 51.6 03/02/2023   VD25OH 45.0 09/21/2022   Plan: - Since Winter is approaching her vitamin D levels are prone to drop. I recommend that she Begin a OTC oral vitamin D supplement 1,000lU daily during the Winter.   - weight loss will likely improve availability of vitamin D, thus encouraged Shawna Craig to continue with meal plan and their weight loss efforts to further improve this condition.  Thus, we will need to monitor levels regularly (every 3-4 mo on average) to keep levels within normal limits and prevent over supplementation.  Labs were reviewed with patient today and  education provided on them. We discussed how the foods patient eats may influence these laboratory findings.  All of the patient's questions about them were answered    B12 deficiency Assessment: Condition is Controlled.. Patient reports good compliance and tolerance of OTC vitamin B12 supplement. She denies ay adverse side effects. Her vitamin B-12 level improved from 350 to 509 as of 03/02/2023.  Lab Results  Component Value Date   VITAMINB12 509 03/02/2023   Plan: - Continue with OTC Cyanocobalamin as directed. I will refill today.   - Continue to eat foods high in B-12 such as dark leafy greens, lentils,  and lean red meat.   Labs were reviewed with patient today and education provided on them. We discussed how the foods patient eats may influence these laboratory findings.  All of the patient's questions about them were answered    Mood disorder [facility])- Emotional Eating Assessment: Condition is Improving, but not optimized.. Denies any SI/HI. Mood is stable. Cravings and hunger are not well controlled. Her psychiatrist placed her on Wellbutrin which she states help her hunger only some. She continues to meet with [name] and thoroughly enjoys it and has an appt. with her tomorrow.   Plan: - Discuss with her psychiatrist about the possibility of increasing her Wellbutrin to 300mg  BID.   - Continue to meet with [name] and discuss healthy eating strategies.   - Importance of following up with psychiatrist and others was stressed.   - Reminded patient of the importance of following their prudent nutrition plan and how food can affect mood as well to support emotional wellbeing.    TREATMENT PLAN FOR OBESITY: Obesity (BMI 30.0-34.9) current BMI 32.73 Class 1 obesity with serious comorbidity and body mass index (BMI) of 32.0 to 32.9 in adult, unspecified obesity type, current BMI- 30.76 Assessment:  Shawna Craig is here to discuss her progress with her obesity treatment plan along with follow-up of her obesity related diagnoses. See Medical Weight Management Flowsheet for complete bioelectrical impedance results.  Condition is not optimal. Biometric data collected today, was reviewed with patient.   Since last office visit on 01/04/2023 patient's  Muscle mass has decreased by 1.6lb. Fat mass has increased by 11.2lb. Total body water has increased by 2.8lb.  Counseling done on how various foods will affect these numbers and how to maximize success  Total lbs lost to date: 0 Total weight loss percentage to date: 0%   Provided hand out on proper plate portions, healthy complex carbs  and sources of protein as well as generaly healthy eating concepts.   Plan: - Shawna Craig will work on healthier eating habits and follow the Category 2 Plan and keeping a food journal and adhering to recommended goals of 1150-1250 calories and 85+ protein.  Behavioral Intervention Additional resources provided today: category 2 meal plan information, Food journaling plan information, and food log, dining out guide, Metformin educational handout, Provided hand out on proper plate portions, healthy complex carbs and sources of protein as well as generaly healthy eating concepts.  Evidence-based interventions for health behavior change were utilized today including the discussion of self monitoring techniques, problem-solving barriers and SMART goal setting techniques.   Regarding patient's less desirable eating habits and patterns, we employed the technique of small changes.  Pt will specifically work on: journal calories and protein intake and of walking 5 days a week for next visit.     She has agreed to Continue current level of physical activity  and Think about enjoyable ways to increase daily physical activity and overcoming barriers to exercise   FOLLOW UP: Return in about 3 weeks (around 03/28/2023).  She was informed of the importance of frequent follow up visits to maximize her success with intensive lifestyle modifications for her multiple health conditions.  Subjective:   Chief complaint: Obesity Shawna Craig is here to discuss her progress with her obesity treatment plan. She is on the Category 2 Plan and keeping a food journal and adhering to recommended goals of 1150-1250 calories and 85+ protein and states she is following her eating plan approximately 10% of the time. She states she is working out 30 minutes 2-3 days per week.  Interval History:  Shawna Craig is here for a follow up office visit.     Since last office visit:  She was last seen on 01/04/2023. She states  that since school has started back it has been hard to workout as she was before as she was walking about 3 miles over the summer for an hour. She notes making some healthy eating choices for part of the day and not following it for the rest. She has been busy with her children and their extracurricular activities.   We reviewed her meal plan and all questions were answered.   Review of Systems:  Pertinent positives were addressed with patient today.  Reviewed by clinician on day of visit: allergies, medications, problem list, medical history, surgical history, family history, social history, and previous encounter notes.  Weight Summary and Biometrics   Weight Lost Since Last Visit: 0  Weight Gained Since Last Visit: 10   Vitals Temp: 98.3 F (36.8 C) BP: 110/72 Pulse Rate: 95 SpO2: 98 %   Anthropometric Measurements Height: 5\' 2"  (1.575 m) Weight: 179 lb (81.2 kg) BMI (Calculated): 32.73 Weight at Last Visit: 169 lb Weight Lost Since Last Visit: 0 Weight Gained Since Last Visit: 10 Starting Weight: 179 lb   Body Composition  Body Fat %: 39.4 % Fat Mass (lbs): 70.8 lbs Muscle Mass (lbs): 103.4 lbs Total Body Water (lbs): 77 lbs Visceral Fat Rating : 8   Other Clinical Data Fasting: no Labs: no Today's Visit #: 6 Starting Date: 09/21/22     Objective:   PHYSICAL EXAM: Blood pressure 110/72, pulse 95, temperature 98.3 F (36.8 C), height 5\' 2"  (1.575 m), weight 179 lb (81.2 kg), last menstrual period 02/21/2023, SpO2 98%. Body mass index is 32.74 kg/m.  General: Well Developed, well nourished, and in no acute distress.  HEENT: Normocephalic, atraumatic Skin: Warm and dry, cap RF less 2 sec, good turgor Chest:  Normal excursion, shape, no gross abn Respiratory: speaking in full sentences, no conversational dyspnea NeuroM-Sk: Ambulates w/o assistance, moves * 4 Psych: A and O *3, insight good, mood-full  DIAGNOSTIC DATA REVIEWED:  BMET    Component  Value Date/Time   NA 141 09/21/2022 1253   K 4.0 09/21/2022 1253   CL 101 09/21/2022 1253   CO2 22 09/21/2022 1253   GLUCOSE 84 09/21/2022 1253   GLUCOSE 88 01/26/2022 1559   BUN 5 (L) 09/21/2022 1253   CREATININE 0.97 09/21/2022 1253   CALCIUM 9.6 09/21/2022 1253   Lab Results  Component Value Date   HGBA1C 5.5 03/02/2023   HGBA1C 5.7 01/26/2022   Lab Results  Component Value Date   INSULIN 8.5 03/02/2023   INSULIN 9.1 09/21/2022   Lab Results  Component Value Date   TSH 1.170 09/21/2022   CBC  Component Value Date/Time   WBC 6.6 09/21/2022 1253   WBC 8.2 01/26/2022 1559   RBC 4.94 09/21/2022 1253   RBC 4.48 01/26/2022 1559   HGB 14.5 09/21/2022 1253   HCT 43.6 09/21/2022 1253   PLT 285 09/21/2022 1253   MCV 88 09/21/2022 1253   MCH 29.4 09/21/2022 1253   MCH 29.2 10/09/2015 0551   MCHC 33.3 09/21/2022 1253   MCHC 33.2 01/26/2022 1559   RDW 12.7 09/21/2022 1253   Iron Studies    Component Value Date/Time   FERRITIN 34 07/15/2015 0903   Lipid Panel     Component Value Date/Time   CHOL 174 09/21/2022 1253   TRIG 83 09/21/2022 1253   HDL 66 09/21/2022 1253   CHOLHDL 2.6 09/21/2022 1253   CHOLHDL 3 01/26/2022 1559   VLDL 21.4 01/26/2022 1559   LDLCALC 93 09/21/2022 1253   Hepatic Function Panel     Component Value Date/Time   PROT 6.9 09/21/2022 1253   ALBUMIN 4.3 09/21/2022 1253   AST 24 09/21/2022 1253   ALT 32 09/21/2022 1253   ALKPHOS 74 09/21/2022 1253   BILITOT 0.3 09/21/2022 1253      Component Value Date/Time   TSH 1.170 09/21/2022 1253   Nutritional Lab Results  Component Value Date   VD25OH 51.6 03/02/2023   VD25OH 45.0 09/21/2022    Attestations:  Patient was in the office today and time spent on visit including pre-visit chart review and post-visit care/coordination of care and electronic medical record documentation was 40+ minutes. 50% of the time was in face to face counseling of this patient's medical condition(s) and  providing education on treatment options to include the first-line treatment of diet and lifestyle modification.  I, Clinical biochemist, acting as a Stage manager for [employer], DO., have compiled all relevant documentation for today's office visit on behalf of Thomasene Lot, DO, while in the presence of [employer], DO.  I have reviewed the above documentation for accuracy and completeness, and I agree with the above. Shawna Craig, D.O.  The 21st Century Cures Act was signed into law in 2016 which includes the topic of electronic health records.  This provides immediate access to information in MyChart.  This includes consultation notes, operative notes, office notes, lab results and pathology reports.  If you have any questions about what you read please let us know at your next visit so we can discuss your concerns and take corrective action if need be.  We are right here with you.

## 2023-03-08 ENCOUNTER — Telehealth (INDEPENDENT_AMBULATORY_CARE_PROVIDER_SITE_OTHER): Payer: BC Managed Care – PPO | Admitting: Psychology

## 2023-03-08 DIAGNOSIS — F411 Generalized anxiety disorder: Secondary | ICD-10-CM

## 2023-03-08 DIAGNOSIS — F5089 Other specified eating disorder: Secondary | ICD-10-CM

## 2023-03-08 NOTE — Progress Notes (Signed)
  Office: [phone]  /  Fax: [phone]    Date: March 08, 2023  Appointment Start Time: 4:00pm Duration: 22 minutes Provider: Lawerance Cruel, Psy.D. Type of Session: Individual Therapy  Location of Patient: Home (private location) Location of Provider: Provider's Home (private office) Type of Contact: Telepsychological Visit via MyChart Video Visit  Session Content: Shawna Craig is a 37 y.o. female presenting for a follow-up appointment to address the previously established treatment goal of increasing coping skills.Today's appointment was a telepsychological visit. Shawna Craig provided verbal consent for today's telepsychological appointment and she is aware she is responsible for securing confidentiality on her end of the session. Prior to proceeding with today's appointment, Shawna Craig's physical location at the time of this appointment was obtained as well a phone number she could be reached at in the event of technical difficulties. Shawna Craig and this provider participated in today's telepsychological service.   This provider conducted a brief check-in. Shawna Craig reported, "I've been on the struggle bus." She acknowledged recent weight gain due to changes in her routine, noting she is "eating out of convenience." Reviewed triggers for emotional eating behaviors. She noted, "I'm really bad at taking care of myself." Psychoeducation provided regarding all or nothing. Psychoeducation was also provided regarding self-compassion. Shawna Craig was engaged in a self-compassion exercise to help with eating-related challenges and other ongoing stressors. She was encouraged to regularly ask herself, "What do I need right now?" and "How can I comfort and care for myself in this moment?" Overall, Shawna Craig was receptive to today's appointment as evidenced by openness to sharing, responsiveness to feedback, and willingness to work toward increasing self-compassion.  Mental Status Examination:  Appearance:  neat Behavior: appropriate to circumstances Mood: neutral Affect: mood congruent Speech: WNL Eye Contact: appropriate Psychomotor Activity: WNL Gait: unable to assess Thought Process: linear, logical, and goal directed and no evidence or endorsement of suicidal, homicidal, and self-harm ideation, plan and intent  Thought Content/Perception: no hallucinations, delusions, bizarre thinking or behavior endorsed or observed Orientation: AAOx4 Memory/Concentration: intact Insight: fair Judgment: fair  Interventions:  Conducted a brief chart review Provided empathic reflections and validation Reviewed content from the previous session Employed supportive psychotherapy interventions to facilitate reduced distress and to improve coping skills with identified stressors Psychoeducation provided regarding self-compassion Engaged pt in a self-compassion exercise  DSM-5 Diagnosis(es):  F50.89 Other Specified Feeding or Eating Disorder, Emotional Eating Behaviors and F41.1 Generalized Anxiety Disorder  Treatment Goal & Progress: During the initial appointment with this provider, the following treatment goal was established: increase coping skills. Shawna Craig has demonstrated progress in her goal as evidenced by increased awareness of hunger patterns and increased awareness of triggers for emotional eating behaviors. Shawna Craig also continues to demonstrate willingness to engage in learned skill(s).  Plan: The next appointment is scheduled for 03/29/2023 at 11am, which will be via MyChart Video Visit. The next session will focus on working towards the established treatment goal. Shawna Craig will continue with her primary therapist.

## 2023-03-29 ENCOUNTER — Telehealth (INDEPENDENT_AMBULATORY_CARE_PROVIDER_SITE_OTHER): Payer: BC Managed Care – PPO | Admitting: Psychology

## 2023-03-29 DIAGNOSIS — F5089 Other specified eating disorder: Secondary | ICD-10-CM | POA: Diagnosis not present

## 2023-03-29 DIAGNOSIS — F411 Generalized anxiety disorder: Secondary | ICD-10-CM | POA: Diagnosis not present

## 2023-03-29 NOTE — Progress Notes (Signed)
  Office: [phone]  /  Fax: [phone]    Date: March 29, 2023  Appointment Start Time: 11:02am Duration: 26 minutes Provider: Lawerance Cruel, Psy.D. Type of Session: Individual Therapy  Location of Patient: Parked in car (address obtained; safe/private location) Location of Provider: Provider's Home (private office) Type of Contact: Telepsychological Visit via MyChart Video Visit  Session Content: Shawna Craig is a 37 y.o. female presenting for a follow-up appointment to address the previously established treatment goal of increasing coping skills.Today's appointment was a telepsychological visit. Shawna Craig provided verbal consent for today's telepsychological appointment and she is aware she is responsible for securing confidentiality on her end of the session. Prior to proceeding with today's appointment, Shawna Craig's physical location at the time of this appointment was obtained as well a phone number she could be reached at in the event of technical difficulties. Shawna Craig and this provider participated in today's telepsychological service.   This provider conducted a brief check-in. Shawna Craig stated, "Nothing has changed too much." She discussed reviewing triggers for emotional eating behaviors again and reported challenges with following her prescribed structured meal plan. She was engaged in problem solving. Reviewed self-compassion. This provider and Shawna Craig explored the story her mind tells her about her weight loss/eating habits that is stuck on repeat. Shawna Craig, Shawna Craig was receptive to today's appointment as evidenced by openness to sharing, responsiveness to feedback, and willingness to work toward increasing self-compassion.  Mental Status Examination:  Appearance: neat Behavior: appropriate to circumstances Mood: neutral Affect: mood congruent Speech: WNL Eye Contact: appropriate Psychomotor  Activity: WNL Gait: unable to assess Thought Process: linear, logical, and goal directed and no evidence or endorsement of suicidal, homicidal, and self-harm ideation, plan and intent  Thought Content/Perception: no hallucinations, delusions, bizarre thinking or behavior endorsed or observed Orientation: AAOx4 Memory/Concentration: intact Insight: fair Judgment: fair  Interventions:  Conducted a brief chart review Provided empathic reflections and validation Reviewed content from the previous session Provided positive reinforcement Employed supportive psychotherapy interventions to facilitate reduced distress and to improve coping skills with identified stressors Engaged patient in problem solving Engaged pt in a self-compassion exercise  DSM-5 Diagnosis(es):  F50.89 Other Specified Feeding or Eating Disorder, Emotional Eating Behaviors and F41.1 Generalized Anxiety Disorder  Treatment Goal & Progress: During the initial appointment with this provider, the following treatment goal was established: increase coping skills. Shawna Craig has demonstrated progress in her goal as evidenced by increased awareness of hunger patterns and increased awareness of triggers for emotional eating behaviors. Shawna Craig also continues to demonstrate willingness to engage in learned skill(s).  Plan: The next appointment is scheduled for 05/03/2023 at 11am, which will be via MyChart Video Visit. The next session will focus on working towards the established treatment goal.

## 2023-04-04 ENCOUNTER — Encounter (INDEPENDENT_AMBULATORY_CARE_PROVIDER_SITE_OTHER): Payer: Self-pay

## 2023-04-04 ENCOUNTER — Ambulatory Visit (INDEPENDENT_AMBULATORY_CARE_PROVIDER_SITE_OTHER): Payer: BC Managed Care – PPO | Admitting: Family Medicine

## 2023-04-04 ENCOUNTER — Other Ambulatory Visit (INDEPENDENT_AMBULATORY_CARE_PROVIDER_SITE_OTHER): Payer: Self-pay | Admitting: Family Medicine

## 2023-04-04 DIAGNOSIS — Z91199 Patient's noncompliance with other medical treatment and regimen due to unspecified reason: Secondary | ICD-10-CM

## 2023-04-04 DIAGNOSIS — R7303 Prediabetes: Secondary | ICD-10-CM

## 2023-04-05 NOTE — Progress Notes (Signed)
No show for appt. 

## 2023-04-19 ENCOUNTER — Encounter (INDEPENDENT_AMBULATORY_CARE_PROVIDER_SITE_OTHER): Payer: Self-pay | Admitting: Family Medicine

## 2023-04-21 ENCOUNTER — Ambulatory Visit: Payer: BC Managed Care – PPO | Admitting: Primary Care

## 2023-04-21 ENCOUNTER — Encounter: Payer: Self-pay | Admitting: Primary Care

## 2023-04-21 VITALS — BP 138/88 | HR 117 | Temp 98.2°F | Ht 62.0 in | Wt 186.0 lb

## 2023-04-21 DIAGNOSIS — R051 Acute cough: Secondary | ICD-10-CM | POA: Insufficient documentation

## 2023-04-21 DIAGNOSIS — Z0000 Encounter for general adult medical examination without abnormal findings: Secondary | ICD-10-CM

## 2023-04-21 LAB — POC COVID19 BINAXNOW: SARS Coronavirus 2 Ag: NEGATIVE

## 2023-04-21 LAB — POCT INFLUENZA A/B
Influenza A, POC: NEGATIVE
Influenza B, POC: NEGATIVE

## 2023-04-21 MED ORDER — HYDROCOD POLI-CHLORPHE POLI ER 10-8 MG/5ML PO SUER
5.0000 mL | Freq: Two times a day (BID) | ORAL | 0 refills | Status: DC | PRN
Start: 2023-04-21 — End: 2023-07-25

## 2023-04-21 NOTE — Assessment & Plan Note (Signed)
HPI and presentation representative of viral etiology at this point. Respiratory exam reassuring.  Rapid influenza and COVID-19 test negative today.  Discussed symptoms, typical trajectory for viral illness, other signs and symptoms to be watching out for. Will treat conservatively.  Continue ibuprofen/Tylenol.  Start Tussionex at bedtime as needed.  Drowsiness precautions provided. Offered nondrowsy option for which she declines.  Recommended she update [place] early next week.  If no improvement in symptoms then would recommend antibiotic treatment.

## 2023-04-21 NOTE — Progress Notes (Signed)
Subjective:    Patient ID: Shawna Craig, female    DOB: 1985/07/20, 37 y.o.   MRN: [id number]  Fever  Associated symptoms include congestion, coughing and headaches. Pertinent negatives include no ear pain or sore throat.    Shawna Craig is a very pleasant 37 y.o. female patient of Dr. [name] without a significant respiratory history who presents today to discuss URI symptoms.  Sudden symptom onset 36 hours ago with fevers and dry cough. Temperatures ranging 99-102. Yesterday she woke up with body aches, nasal congestion. This morning she felt nauseated.   She's been taking Tylenol and Ibuprofen which helps to bring down the fevers.   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   A few weeks ago she was acutely ill with respiratory symptoms. Those symptoms resolved.     Review of Systems  Constitutional:  Positive for chills, fatigue and fever.  HENT:  Positive for congestion and postnasal drip. Negative for ear pain, rhinorrhea, sinus pain and sore throat.   Respiratory:  Positive for cough. Negative for chest tightness and shortness of breath.   Neurological:  Positive for headaches.         Past Medical History:  Diagnosis Date   Anxiety    Cervical high risk human papillomavirus (HPV) DNA test positive    Depression    Genital warts    Hidradenitis suppurativa    Vaccine for human papilloma virus (HPV) types 6, 11, 16, and 18 administered    Yeast vaginitis     Social History   Socioeconomic History   Marital status: Widowed    Spouse name: Onalee Hua   Number of children: 1   Years of education: Not on file   Highest education level: Not on file  Occupational History   Occupation: exercise teacher     Employer: [employer]  Tobacco Use   Smoking status: Never   Smokeless tobacco: Never  Vaping Use   Vaping status: Never Used  Substance and Sexual Activity   Alcohol use: No    Alcohol/week: 0.0 standard drinks of  alcohol   Drug use: No   Sexual activity: Yes    Birth control/protection: Pill  Other Topics Concern   Not on file  Social History Narrative   Pt is married (Dr Karle Starch daughter in Social worker) , and has a 76 mo old daughter   Stays home and also teaches exercise classes at the [employer] on finishing a degree on line    Social Determinants of Health   Financial Resource Strain: Not on file  Food Insecurity: Not on file  Transportation Needs: Not on file  Physical Activity: Not on file  Stress: Not on file  Social Connections: Not on file  Intimate Partner Violence: Not on file    Past Surgical History:  Procedure Laterality Date   TONSILLECTOMY AND ADENOIDECTOMY     2007   WISDOM TOOTH EXTRACTION      Family History  Problem Relation Age of Onset   Diabetes Mother    Obesity Mother    Diabetes Father    Breast cancer Neg Hx    Ovarian cancer Neg Hx     No Known Allergies  Current Outpatient Medications on File Prior to Visit  Medication Sig Dispense Refill   buPROPion (WELLBUTRIN SR) 150 MG 12 hr tablet Take 150 mg by mouth 2 (two) times daily.     cyanocobalamin (VITAMIN B12)  500 MCG tablet Take 1 tablet (500 mcg total) by mouth daily.     fluticasone (FLONASE) 50 MCG/ACT nasal spray Place 2 sprays into both nostrils daily. 16 g 6   norethindrone-ethinyl estradiol-iron (JUNEL FE 1.5/30) 1.5-30 MG-MCG tablet Take 1 tablet by mouth daily. 84 tablet 3   Probiotic Product (PROBIOTIC PO) Take 1 tablet by mouth daily.     sertraline (ZOLOFT) 50 MG tablet Take 50 mg by mouth every morning.     Cholecalciferol (VITAMIN D3) 25 MCG (1000 UT) CAPS Take 1 capsule (1,000 Units total) by mouth daily. In wintertime (Patient not taking: Reported on 04/21/2023)     metFORMIN (GLUCOPHAGE) 500 MG tablet 1 po with lunch daily (Patient not taking: Reported on 04/21/2023) 30 tablet 0   No current facility-administered medications on file prior to visit.    BP 138/88   Pulse (!) 117   Temp  98.2 F (36.8 C) (Temporal)   Ht 5\' 2"  (1.575 m)   Wt 186 lb (84.4 kg)   SpO2 96%   BMI 34.02 kg/m  Objective:   Physical Exam Constitutional:      Appearance: She is not ill-appearing.  HENT:     Right Ear: Tympanic membrane and ear canal normal.     Left Ear: Tympanic membrane and ear canal normal.     Nose: No mucosal edema.     Right Sinus: No maxillary sinus tenderness or frontal sinus tenderness.     Left Sinus: No maxillary sinus tenderness or frontal sinus tenderness.     Mouth/Throat:     Mouth: Mucous membranes are moist.  Eyes:     Conjunctiva/sclera: Conjunctivae normal.  Cardiovascular:     Rate and Rhythm: Regular rhythm. Tachycardia present.  Pulmonary:     Effort: Pulmonary effort is normal.     Breath sounds: Normal breath sounds. No wheezing or rhonchi.  Musculoskeletal:     Cervical back: Neck supple.  Skin:    General: Skin is warm and dry.           Assessment & Plan:  Acute cough Assessment & Plan: HPI and presentation representative of viral etiology at this point. Respiratory exam reassuring.  Rapid influenza and COVID-19 test negative today.  Discussed symptoms, typical trajectory for viral illness, other signs and symptoms to be watching out for. Will treat conservatively.  Continue ibuprofen/Tylenol.  Start Tussionex at bedtime as needed.  Drowsiness precautions provided. Offered nondrowsy option for which she declines.  Recommended she update [place] early next week.  If no improvement in symptoms then would recommend antibiotic treatment.  Orders: -     POC COVID-19 BinaxNow -     POCT Influenza A/B -     Hydrocod Poli-Chlorphe Poli ER; Take 5 mLs by mouth every 12 (twelve) hours as needed.  Dispense: 50 mL; Refill: 0  Routine general medical examination at a health care facility -     Hemoglobin A1c; Future -     Comprehensive metabolic panel; Future -     TSH; Future -     Lipid panel; Future -     CBC with  Differential/Platelet; Future        Doreene Nest, NP

## 2023-04-21 NOTE — Patient Instructions (Signed)
You may take the cough suppressant every 12 hours as needed for cough and rest. Caution this medication contains codeine which may cause drowsiness.   Continue Tylenol/ibuprofen for fevers.  Continue to hydrate well with water.  Please call us early next week if your symptoms are worse or do not improve.  It was a pleasure meeting you!

## 2023-04-24 ENCOUNTER — Ambulatory Visit
Admission: EM | Admit: 2023-04-24 | Discharge: 2023-04-24 | Disposition: A | Discharge status: Home / Self Care | Payer: BC Managed Care – PPO | Attending: Emergency Medicine | Admitting: Emergency Medicine

## 2023-04-24 DIAGNOSIS — J069 Acute upper respiratory infection, unspecified: Secondary | ICD-10-CM | POA: Diagnosis not present

## 2023-04-24 MED ORDER — PREDNISONE 10 MG (21) PO TBPK: ORAL_TABLET | Freq: Every day | ORAL | 0 refills | Status: DC

## 2023-04-24 MED ORDER — AMOXICILLIN-POT CLAVULANATE 875-125 MG PO TABS: 1.0000 | ORAL_TABLET | Freq: Two times a day (BID) | ORAL | 0 refills | Status: DC

## 2023-04-24 MED ORDER — FLUCONAZOLE 150 MG PO TABS: 150.0000 mg | ORAL_TABLET | Freq: Every day | ORAL | 0 refills | Status: AC

## 2023-04-24 NOTE — Discharge Instructions (Signed)
Your symptoms today are most likely being caused by a virus and should steadily improve in time it can take up to 7 to 10 days before you truly start to see a turnaround however things will get better if no improvement seen by Thursday, December 12 you may pick up Augmentin from the pharmacy for bacterial coverage  In the meantime begin prednisone every morning with food as directed to help open and relax the airway which ideally will help settle some of your coughing    You can take Tylenol and/or Ibuprofen as needed for fever reduction and pain relief.   For cough: honey 1/2 to 1 teaspoon (you can dilute the honey in water or another fluid).  You can also use guaifenesin and dextromethorphan for cough. You can use a humidifier for chest congestion and cough.  If you don't have a humidifier, you can sit in the bathroom with the hot shower running.      For sore throat: try warm salt water gargles, cepacol lozenges, throat spray, warm tea or water with lemon/honey, popsicles or ice, or OTC cold relief medicine for throat discomfort.   For congestion: take a daily anti-histamine like Zyrtec, Claritin, and a oral decongestant, such as pseudoephedrine.  You can also use Flonase 1-2 sprays in each nostril daily.   It is important to stay hydrated: drink plenty of fluids (water, gatorade/powerade/pedialyte, juices, or teas) to keep your throat moisturized and help further relieve irritation/discomfort.

## 2023-04-24 NOTE — ED Provider Notes (Signed)
Shawna Craig    CSN: [id number] Arrival date & time: 04/24/23  0807      History   Chief Complaint No chief complaint on file.   HPI Shawna Craig is a 37 y.o. female.   Patient presents for evaluation of fever peaking at 102, nasal congestion, rhinorrhea and intermittent generalized headaches present for 5 days.  Cough is productive at times.  Was seen by PCP 3 days ago, diagnosed with viral illness and prescribe Tussionex, has been using but endorses is not helpful with cough.  Denies shortness of breath, wheezing or abdominal symptoms.  Decreased appetite but tolerating food and liquids.  Past Medical History:  Diagnosis Date   Anxiety    Cervical high risk human papillomavirus (HPV) DNA test positive    Depression    Genital warts    Hidradenitis suppurativa    Vaccine for human papilloma virus (HPV) types 6, 11, 16, and 18 administered    Yeast vaginitis     Patient Active Problem List   Diagnosis Date Noted   Acute cough 04/21/2023   Diabetes mellitus screening 01/23/2023   Ganglion cyst 06/18/2022   Prediabetes 01/27/2022   Family history of diabetes mellitus 01/26/2022   Left groin pain 01/26/2022   Dysplasia of cervix, low grade (CIN 1) 05/05/2020   Cervical high risk human papillomavirus (HPV) DNA test positive    Genital warts 01/16/2019   Vaginal burning 01/16/2019   Routine general medical examination at a health care facility 10/17/2018   Anxiety and depression 04/12/2016   Hidradenitis suppurativa 02/03/2016   BMI 35.0-35.9,adult 01/27/2016   Labor and delivery indication for care or intervention 10/08/2015   PROM (premature rupture of membranes) 10/08/2015   Normal vaginal delivery 10/08/2015   Fatigue 07/15/2015    Past Surgical History:  Procedure Laterality Date   TONSILLECTOMY AND ADENOIDECTOMY     2007   WISDOM TOOTH EXTRACTION      OB History     Gravida  2   Para  2   Term  2   Preterm  0   AB  0   Living  2       SAB  0   IAB  0   Ectopic  0   Multiple  0   Live Births  2            Home Medications    Prior to Admission medications   Medication Sig Start Date End Date Taking? Authorizing Provider  amoxicillin-clavulanate (AUGMENTIN) 875-125 MG tablet Take 1 tablet by mouth every 12 (twelve) hours. 04/28/23  Yes Aydrian Halpin R, NP  fluconazole (DIFLUCAN) 150 MG tablet Take 1 tablet (150 mg total) by mouth daily for 2 doses. 04/28/23 04/30/23 Yes Anitha Kreiser R, NP  predniSONE (STERAPRED UNI-PAK 21 TAB) 10 MG (21) TBPK tablet Take by mouth daily. Take 6 tabs by mouth daily  for 1 days, then 5 tabs for 1 days, then 4 tabs for 1 days, then 3 tabs for 1 days, 2 tabs for 1 days, then 1 tab by mouth daily for 1 days 04/24/23  Yes Aretha Levi R, NP  buPROPion (WELLBUTRIN SR) 150 MG 12 hr tablet Take 150 mg by mouth 2 (two) times daily. 11/16/21   [provider]  chlorpheniramine-HYDROcodone (TUSSIONEX) 10-8 MG/5ML Take 5 mLs by mouth every 12 (twelve) hours as needed. 04/21/23   Doreene Nest, NP  Cholecalciferol (VITAMIN D3) 25 MCG (1000 UT) CAPS Take 1 capsule (1,000  Units total) by mouth daily. In wintertime Patient not taking: Reported on 04/21/2023 03/07/23   Thomasene Lot, DO  cyanocobalamin (VITAMIN B12) 500 MCG tablet Take 1 tablet (500 mcg total) by mouth daily. 03/07/23   Opalski, Deborah, DO  fluticasone (FLONASE) 50 MCG/ACT nasal spray Place 2 sprays into both nostrils daily. 06/28/22   Tower, Audrie Gallus, MD  metFORMIN (GLUCOPHAGE) 500 MG tablet 1 po with lunch daily Patient not taking: Reported on 04/21/2023 03/07/23   Thomasene Lot, DO  norethindrone-ethinyl estradiol-iron (JUNEL FE 1.5/30) 1.5-30 MG-MCG tablet Take 1 tablet by mouth daily. 12/27/22   Copland, Ilona Sorrel, PA-C  Probiotic Product (PROBIOTIC PO) Take 1 tablet by mouth daily.    [provider]  sertraline (ZOLOFT) 50 MG tablet Take 50 mg by mouth every morning. 11/23/22   [provider]    Family History Family History  Problem Relation Age of Onset   Diabetes Mother    Obesity Mother    Diabetes Father    Breast cancer Neg Hx    Ovarian cancer Neg Hx     Social History Social History   Tobacco Use   Smoking status: Never   Smokeless tobacco: Never  Vaping Use   Vaping status: Never Used  Substance Use Topics   Alcohol use: No    Alcohol/week: 0.0 standard drinks of alcohol   Drug use: No     Allergies   Patient has no known allergies.   Review of Systems Review of Systems   Physical Exam Triage Vital Signs ED Triage Vitals [04/24/23 0824]  Encounter Vitals Group     BP 112/78     Systolic BP Percentile      Diastolic BP Percentile      Pulse Rate (!) 111     Resp 16     Temp 99.6 F (37.6 C)     Temp Source Oral     SpO2 95 %     Weight      Height      Head Circumference      Peak Flow      Pain Score      Pain Loc      Pain Education      Exclude from Growth Chart    No data found.  Updated Vital Signs BP 112/78 (BP Location: Right Arm)   Pulse (!) 111   Temp 99.6 F (37.6 C) (Oral)   Resp 16   SpO2 95%   Visual Acuity Right Eye Distance:   Left Eye Distance:   Bilateral Distance:    Right Eye Near:   Left Eye Near:    Bilateral Near:     Physical Exam Constitutional:      Appearance: She is ill-appearing.  HENT:     Head: Normocephalic.     Right Ear: Tympanic membrane, ear canal and external ear normal.     Left Ear: Tympanic membrane, ear canal and external ear normal.     Nose: Congestion present. No rhinorrhea.     Mouth/Throat:     Mouth: Mucous membranes are moist.     Pharynx: Oropharynx is clear. No oropharyngeal exudate or posterior oropharyngeal erythema.  Eyes:     Extraocular Movements: Extraocular movements intact.  Cardiovascular:     Rate and Rhythm: Normal rate and regular rhythm.     Pulses: Normal pulses.     Heart sounds: Normal heart sounds.  Pulmonary:     Effort:  Pulmonary effort is  normal.     Breath sounds: Normal breath sounds.  Musculoskeletal:     Cervical back: Normal range of motion and neck supple.  Skin:    General: Skin is warm and dry.  Neurological:     Mental Status: She is alert and oriented to person, place, and time. Mental status is at baseline.      UC Treatments / Results  Labs (all labs ordered are listed, but only abnormal results are displayed) Labs Reviewed - No data to display  EKG   Radiology No results found.  Procedures Procedures (including critical care time)  Medications Ordered in UC Medications - No data to display  Initial Impression / Assessment and Plan / UC Course  I have reviewed the triage vital signs and the nursing notes.  Pertinent labs & imaging results that were available during my care of the patient were reviewed by me and considered in my medical decision making (see chart for details).  Viral URI with cough  Patient is in no signs of distress nor toxic appearing.  Vital signs are stable.  Low suspicion for pneumonia, pneumothorax or bronchitis and therefore will defer imaging.  Viral testing completed by PCP, negative for COVID and flu.  Prescribed prednisone in place watch wait antibiotic for day 10 of illness, Augmentin prescribed.  Declined prescription for Tessalon as she endorses this it is ineffective. May use additional over-the-counter medications as needed for supportive care.  May follow-up with urgent care as needed if symptoms persist or worsen.  Note given.   Final Clinical Impressions(s) / UC Diagnoses   Final diagnoses:  Viral URI with cough     Discharge Instructions      Your symptoms today are most likely being caused by a virus and should steadily improve in time it can take up to 7 to 10 days before you truly start to see a turnaround however things will get better if no improvement seen by Thursday, December 12 you may pick up Augmentin from the pharmacy for  bacterial coverage  In the meantime begin prednisone every morning with food as directed to help open and relax the airway which ideally will help settle some of your coughing    You can take Tylenol and/or Ibuprofen as needed for fever reduction and pain relief.   For cough: honey 1/2 to 1 teaspoon (you can dilute the honey in water or another fluid).  You can also use guaifenesin and dextromethorphan for cough. You can use a humidifier for chest congestion and cough.  If you don't have a humidifier, you can sit in the bathroom with the hot shower running.      For sore throat: try warm salt water gargles, cepacol lozenges, throat spray, warm tea or water with lemon/honey, popsicles or ice, or OTC cold relief medicine for throat discomfort.   For congestion: take a daily anti-histamine like Zyrtec, Claritin, and a oral decongestant, such as pseudoephedrine.  You can also use Flonase 1-2 sprays in each nostril daily.   It is important to stay hydrated: drink plenty of fluids (water, gatorade/powerade/pedialyte, juices, or teas) to keep your throat moisturized and help further relieve irritation/discomfort.    ED Prescriptions     Medication Sig Dispense Auth. Provider   predniSONE (STERAPRED UNI-PAK 21 TAB) 10 MG (21) TBPK tablet Take by mouth daily. Take 6 tabs by mouth daily  for 1 days, then 5 tabs for 1 days, then 4 tabs for 1 days, then 3 tabs for  1 days, 2 tabs for 1 days, then 1 tab by mouth daily for 1 days 21 tablet Sharion Grieves R, NP   amoxicillin-clavulanate (AUGMENTIN) 875-125 MG tablet Take 1 tablet by mouth every 12 (twelve) hours. 14 tablet Amen Dargis R, NP   fluconazole (DIFLUCAN) 150 MG tablet Take 1 tablet (150 mg total) by mouth daily for 2 doses. 2 tablet Valinda Hoar, NP      PDMP not reviewed this encounter.   Valinda Hoar, [place] 04/24/23 [phone]

## 2023-04-27 ENCOUNTER — Ambulatory Visit
Admission: EM | Admit: 2023-04-27 | Discharge: 2023-04-27 | Disposition: A | Discharge status: Home / Self Care | Payer: BC Managed Care – PPO | Attending: Emergency Medicine | Admitting: Emergency Medicine

## 2023-04-27 DIAGNOSIS — R051 Acute cough: Secondary | ICD-10-CM | POA: Diagnosis not present

## 2023-04-27 MED ORDER — DEXAMETHASONE SODIUM PHOSPHATE 10 MG/ML IJ SOLN
10.0000 mg | Freq: Once | INTRAMUSCULAR | Status: AC
  Administered 2023-04-27: 10 mg via INTRAMUSCULAR

## 2023-04-27 MED ORDER — PROMETHAZINE-DM 6.25-15 MG/5ML PO SYRP: 5.0000 mL | ORAL_SOLUTION | Freq: Four times a day (QID) | ORAL | 0 refills | Status: DC | PRN

## 2023-04-27 NOTE — ED Triage Notes (Signed)
Patient presents to UC for cough x Monday. States she was seen Sunday and prescribed antibiotics and prednisone states it helped but cough has worsened.   Denies fever.

## 2023-04-27 NOTE — ED Provider Notes (Signed)
Renaldo Fiddler    CSN: [id number] Arrival date & time: 04/27/23  6283      History   Chief Complaint Chief Complaint  Patient presents with   Cough    HPI Shawna Craig is a 37 y.o. female.   Patient presents for evaluation of a worsening cough for the last 2 to 3 days.  Dors is cough has become harsh making it difficult to breathe with associated shortness of breath after coughing has resolved.  Was evaluated in this urgent care 3 days ago diagnosed with a viral illness and prescribed steroids which she has been taking.  Watchful wait antibiotic that was placed at the pharmacy to begin tomorrow was given early to patient by the pharmacist and she has begun use.  Endorses that she felt some improvement within 24 hours but now worsening cough.  Decreased appetite but tolerating food and liquids.  Has not attempted any additional treatment.  Denies wheezing.  Past Medical History:  Diagnosis Date   Anxiety    Cervical high risk human papillomavirus (HPV) DNA test positive    Depression    Genital warts    Hidradenitis suppurativa    Vaccine for human papilloma virus (HPV) types 6, 11, 16, and 18 administered    Yeast vaginitis     Patient Active Problem List   Diagnosis Date Noted   Acute cough 04/21/2023   Diabetes mellitus screening 01/23/2023   Ganglion cyst 06/18/2022   Prediabetes 01/27/2022   Family history of diabetes mellitus 01/26/2022   Left groin pain 01/26/2022   Dysplasia of cervix, low grade (CIN 1) 05/05/2020   Cervical high risk human papillomavirus (HPV) DNA test positive    Genital warts 01/16/2019   Vaginal burning 01/16/2019   Routine general medical examination at a health care facility 10/17/2018   Anxiety and depression 04/12/2016   Hidradenitis suppurativa 02/03/2016   BMI 35.0-35.9,adult 01/27/2016   Labor and delivery indication for care or intervention 10/08/2015   PROM (premature rupture of membranes) 10/08/2015   Normal vaginal  delivery 10/08/2015   Fatigue 07/15/2015    Past Surgical History:  Procedure Laterality Date   TONSILLECTOMY AND ADENOIDECTOMY     2007   WISDOM TOOTH EXTRACTION      OB History     Gravida  2   Para  2   Term  2   Preterm  0   AB  0   Living  2      SAB  0   IAB  0   Ectopic  0   Multiple  0   Live Births  2            Home Medications    Prior to Admission medications   Medication Sig Start Date End Date Taking? Authorizing Provider  amoxicillin-clavulanate (AUGMENTIN) 875-125 MG tablet Take 1 tablet by mouth every 12 (twelve) hours. 04/28/23   Valinda Hoar, NP  buPROPion (WELLBUTRIN SR) 150 MG 12 hr tablet Take 150 mg by mouth 2 (two) times daily. 11/16/21   [provider]  chlorpheniramine-HYDROcodone (TUSSIONEX) 10-8 MG/5ML Take 5 mLs by mouth every 12 (twelve) hours as needed. 04/21/23   Doreene Nest, NP  Cholecalciferol (VITAMIN D3) 25 MCG (1000 UT) CAPS Take 1 capsule (1,000 Units total) by mouth daily. In wintertime Patient not taking: Reported on 04/21/2023 03/07/23   Thomasene Lot, DO  cyanocobalamin (VITAMIN B12) 500 MCG tablet Take 1 tablet (500 mcg total) by mouth daily.  03/07/23   Opalski, Gavin Pound, DO  fluconazole (DIFLUCAN) 150 MG tablet Take 1 tablet (150 mg total) by mouth daily for 2 doses. 04/28/23 04/30/23  Valinda Hoar, NP  fluticasone (FLONASE) 50 MCG/ACT nasal spray Place 2 sprays into both nostrils daily. 06/28/22   Tower, Audrie Gallus, MD  metFORMIN (GLUCOPHAGE) 500 MG tablet 1 po with lunch daily Patient not taking: Reported on 04/21/2023 03/07/23   Thomasene Lot, DO  norethindrone-ethinyl estradiol-iron (JUNEL FE 1.5/30) 1.5-30 MG-MCG tablet Take 1 tablet by mouth daily. 12/27/22   Copland, Ilona Sorrel, PA-C  predniSONE (STERAPRED UNI-PAK 21 TAB) 10 MG (21) TBPK tablet Take by mouth daily. Take 6 tabs by mouth daily  for 1 days, then 5 tabs for 1 days, then 4 tabs for 1 days, then 3 tabs for 1 days, 2 tabs for 1  days, then 1 tab by mouth daily for 1 days 04/24/23   Valinda Hoar, NP  Probiotic Product (PROBIOTIC PO) Take 1 tablet by mouth daily.    [provider]  promethazine-dextromethorphan (PROMETHAZINE-DM) 6.25-15 MG/5ML syrup Take 5 mLs by mouth 4 (four) times daily as needed for cough. 04/27/23   Valinda Hoar, NP  sertraline (ZOLOFT) 50 MG tablet Take 50 mg by mouth every morning. 11/23/22   [provider]    Family History Family History  Problem Relation Age of Onset   Diabetes Mother    Obesity Mother    Diabetes Father    Breast cancer Neg Hx    Ovarian cancer Neg Hx     Social History Social History   Tobacco Use   Smoking status: Never   Smokeless tobacco: Never  Vaping Use   Vaping status: Never Used  Substance Use Topics   Alcohol use: No    Alcohol/week: 0.0 standard drinks of alcohol   Drug use: No     Allergies   Patient has no known allergies.   Review of Systems Review of Systems   Physical Exam Triage Vital Signs ED Triage Vitals  Encounter Vitals Group     BP 04/27/23 0941 115/82     Systolic BP Percentile --      Diastolic BP Percentile --      Pulse Rate 04/27/23 0941 97     Resp 04/27/23 0941 17     Temp 04/27/23 0941 98.7 F (37.1 C)     Temp Source 04/27/23 0941 Oral     SpO2 04/27/23 0941 96 %     Weight --      Height --      Head Circumference --      Peak Flow --      Pain Score 04/27/23 0947 0     Pain Loc --      Pain Education --      Exclude from Growth Chart --    No data found.  Updated Vital Signs BP 115/82 (BP Location: Left Arm)   Pulse 97   Temp 98.7 F (37.1 C) (Oral)   Resp 17   SpO2 96%   Visual Acuity Right Eye Distance:   Left Eye Distance:   Bilateral Distance:    Right Eye Near:   Left Eye Near:    Bilateral Near:     Physical Exam Constitutional:      Appearance: Normal appearance.  Eyes:     Extraocular Movements: Extraocular movements intact.  Cardiovascular:      Rate and Rhythm: Normal rate and regular rhythm.  Pulses: Normal pulses.     Heart sounds: Normal heart sounds.  Pulmonary:     Effort: Pulmonary effort is normal.     Breath sounds: Normal breath sounds.     Comments: Harsh barking cough witnessed Neurological:     Mental Status: She is alert and oriented to person, place, and time. Mental status is at baseline.      UC Treatments / Results  Labs (all labs ordered are listed, but only abnormal results are displayed) Labs Reviewed - No data to display  EKG   Radiology No results found.  Procedures Procedures (including critical care time)  Medications Ordered in UC Medications  dexamethasone (DECADRON) injection 10 mg (10 mg Intramuscular Given 04/27/23 1013)    Initial Impression / Assessment and Plan / UC Course  I have reviewed the triage vital signs and the nursing notes.  Pertinent labs & imaging results that were available during my care of the patient were reviewed by me and considered in my medical decision making (see chart for details).  Acute cough  Vital signs are stable, lungs are clear to auscultation, O2 saturation 96% on room air, stable for outpatient management, harsh barking cough witnessed but patient currently on steroid course as well as taking antibiotics therefore low suspicion for pneumonia therefore imaging deferred, possible bronchitis however current treatment plan would be managing those symptoms, Decadron IM given in an attempt to reduce symptoms, declined prescription of Tessalon as she endorses that medicine is ineffective, prescribed Promethazine DM, may follow-up with urgent care as needed Final Clinical Impressions(s) / UC Diagnoses   Final diagnoses:  Acute cough     Discharge Instructions      Your symptoms today are most likely being caused by a virus and should steadily improve in time it can take up to 7 to 10 days before you truly start to see a turnaround however things  will get better  You are currently taking antibiotic there is a low suspicion that you have pneumonia at this time therefore we are holding off on chest x-ray  You have been given Decadron which is a steroid shot in an effort to send a burst of medicine through your body to calm irritation and inflammation throughout your airway  Starting tomorrow resume steroid course as directed  You may use cough syrup every 6 hours as needed, please be mindful this will make you feel drowsy, may use any over-the-counter cough suppressant medicine in addition, Delsym is already in the prescribed medicine    You can take Tylenol and/or Ibuprofen as needed for fever reduction and pain relief.   For cough: honey 1/2 to 1 teaspoon (you can dilute the honey in water or another fluid).  You can also use guaifenesin and dextromethorphan for cough. You can use a humidifier for chest congestion and cough.  If you don't have a humidifier, you can sit in the bathroom with the hot shower running.      For sore throat: try warm salt water gargles, cepacol lozenges, throat spray, warm tea or water with lemon/honey, popsicles or ice, or OTC cold relief medicine for throat discomfort.   For congestion: take a daily anti-histamine like Zyrtec, Claritin, and a oral decongestant, such as pseudoephedrine.  You can also use Flonase 1-2 sprays in each nostril daily.   It is important to stay hydrated: drink plenty of fluids (water, gatorade/powerade/pedialyte, juices, or teas) to keep your throat moisturized and help further relieve irritation/discomfort.    ED  Prescriptions     Medication Sig Dispense Auth. Provider   promethazine-dextromethorphan (PROMETHAZINE-DM) 6.25-15 MG/5ML syrup  (Status: Discontinued) Take 5 mLs by mouth 4 (four) times daily as needed for cough. 118 mL Velda Wendt R, NP   promethazine-dextromethorphan (PROMETHAZINE-DM) 6.25-15 MG/5ML syrup Take 5 mLs by mouth 4 (four) times daily as needed for  cough. 118 mL Azyiah Bo, Elita Boone, NP      PDMP not reviewed this encounter.   Valinda Hoar, NP 04/27/23 1051

## 2023-04-27 NOTE — Discharge Instructions (Addendum)
Your symptoms today are most likely being caused by a virus and should steadily improve in time it can take up to 7 to 10 days before you truly start to see a turnaround however things will get better  You are currently taking antibiotic there is a low suspicion that you have pneumonia at this time therefore we are holding off on chest x-ray  You have been given Decadron which is a steroid shot in an effort to send a burst of medicine through your body to calm irritation and inflammation throughout your airway  Starting tomorrow resume steroid course as directed  You may use cough syrup every 6 hours as needed, please be mindful this will make you feel drowsy, may use any over-the-counter cough suppressant medicine in addition, Delsym is already in the prescribed medicine    You can take Tylenol and/or Ibuprofen as needed for fever reduction and pain relief.   For cough: honey 1/2 to 1 teaspoon (you can dilute the honey in water or another fluid).  You can also use guaifenesin and dextromethorphan for cough. You can use a humidifier for chest congestion and cough.  If you don't have a humidifier, you can sit in the bathroom with the hot shower running.      For sore throat: try warm salt water gargles, cepacol lozenges, throat spray, warm tea or water with lemon/honey, popsicles or ice, or OTC cold relief medicine for throat discomfort.   For congestion: take a daily anti-histamine like Zyrtec, Claritin, and a oral decongestant, such as pseudoephedrine.  You can also use Flonase 1-2 sprays in each nostril daily.   It is important to stay hydrated: drink plenty of fluids (water, gatorade/powerade/pedialyte, juices, or teas) to keep your throat moisturized and help further relieve irritation/discomfort.

## 2023-05-02 ENCOUNTER — Ambulatory Visit (INDEPENDENT_AMBULATORY_CARE_PROVIDER_SITE_OTHER): Payer: BC Managed Care – PPO | Admitting: Family Medicine

## 2023-05-03 ENCOUNTER — Telehealth (INDEPENDENT_AMBULATORY_CARE_PROVIDER_SITE_OTHER): Payer: BC Managed Care – PPO | Admitting: Psychology

## 2023-05-20 DIAGNOSIS — F331 Major depressive disorder, recurrent, moderate: Secondary | ICD-10-CM | POA: Diagnosis not present

## 2023-05-20 DIAGNOSIS — F411 Generalized anxiety disorder: Secondary | ICD-10-CM | POA: Diagnosis not present

## 2023-05-23 DIAGNOSIS — R051 Acute cough: Secondary | ICD-10-CM

## 2023-05-24 MED ORDER — PREDNISONE 20 MG PO TABS: ORAL_TABLET | ORAL | 0 refills | Status: DC

## 2023-05-31 ENCOUNTER — Other Ambulatory Visit: Payer: Self-pay | Admitting: Family Medicine

## 2023-07-05 DIAGNOSIS — F411 Generalized anxiety disorder: Secondary | ICD-10-CM | POA: Diagnosis not present

## 2023-07-05 DIAGNOSIS — F339 Major depressive disorder, recurrent, unspecified: Secondary | ICD-10-CM | POA: Diagnosis not present

## 2023-07-12 DIAGNOSIS — F411 Generalized anxiety disorder: Secondary | ICD-10-CM | POA: Diagnosis not present

## 2023-07-12 DIAGNOSIS — F339 Major depressive disorder, recurrent, unspecified: Secondary | ICD-10-CM | POA: Diagnosis not present

## 2023-07-13 DIAGNOSIS — L814 Other melanin hyperpigmentation: Secondary | ICD-10-CM | POA: Diagnosis not present

## 2023-07-13 DIAGNOSIS — L578 Other skin changes due to chronic exposure to nonionizing radiation: Secondary | ICD-10-CM | POA: Diagnosis not present

## 2023-07-13 DIAGNOSIS — L918 Other hypertrophic disorders of the skin: Secondary | ICD-10-CM | POA: Diagnosis not present

## 2023-07-13 DIAGNOSIS — Z808 Family history of malignant neoplasm of other organs or systems: Secondary | ICD-10-CM | POA: Diagnosis not present

## 2023-07-19 DIAGNOSIS — F339 Major depressive disorder, recurrent, unspecified: Secondary | ICD-10-CM | POA: Diagnosis not present

## 2023-07-19 DIAGNOSIS — F411 Generalized anxiety disorder: Secondary | ICD-10-CM | POA: Diagnosis not present

## 2023-07-25 ENCOUNTER — Ambulatory Visit (INDEPENDENT_AMBULATORY_CARE_PROVIDER_SITE_OTHER): Payer: BC Managed Care – PPO | Admitting: Family Medicine

## 2023-07-25 ENCOUNTER — Encounter (INDEPENDENT_AMBULATORY_CARE_PROVIDER_SITE_OTHER): Payer: Self-pay | Admitting: Family Medicine

## 2023-07-25 VITALS — BP 114/77 | HR 98 | Temp 98.0°F | Ht 62.0 in | Wt 194.0 lb

## 2023-07-25 DIAGNOSIS — E669 Obesity, unspecified: Secondary | ICD-10-CM | POA: Diagnosis not present

## 2023-07-25 DIAGNOSIS — R7303 Prediabetes: Secondary | ICD-10-CM

## 2023-07-25 DIAGNOSIS — F3289 Other specified depressive episodes: Secondary | ICD-10-CM

## 2023-07-25 DIAGNOSIS — F5089 Other specified eating disorder: Secondary | ICD-10-CM | POA: Diagnosis not present

## 2023-07-25 DIAGNOSIS — Z6835 Body mass index (BMI) 35.0-35.9, adult: Secondary | ICD-10-CM

## 2023-07-25 NOTE — Progress Notes (Signed)
 Office: [phone]  /  Fax: [phone]  WEIGHT SUMMARY AND BIOMETRICS  Anthropometric Measurements Height: 5\' 2"  (1.575 m) Weight: 194 lb (88 kg) BMI (Calculated): 35.47 Weight at Last Visit: 179lb Weight Lost Since Last Visit: 0 Weight Gained Since Last Visit: 15lb Starting Weight: 179lb Total Weight Loss (lbs): 0 lb (0 kg)   Body Composition  Body Fat %: 43.8 % Fat Mass (lbs): 85 lbs Muscle Mass (lbs): 103.6 lbs Total Body Water (lbs): 76.4 lbs Visceral Fat Rating : 10   Other Clinical Data Fasting: no Labs: no Today's Visit #: 7 Starting Date: 09/21/22    Chief Complaint: OBESITY    History of Present Illness   The patient presents with obesity treatment plan discussion and progress monitoring. She was last seen by Dr. Sharee Holster in October of last year.  She has been struggling with obesity, having gained fifteen pounds over the past five months. She has not been following a structured eating plan and finds it difficult to make healthy food choices, especially when tired or stressed. She has a history of prediabetes, with her most recent hemoglobin A1c being controlled at 5.5. She had been on metformin in the past but did not notice significant benefits.  Emotional eating and stress are significant factors in her weight management challenges, particularly following the death of her husband eight years ago when she had a newborn and a three-year-old. This emotional burden has contributed to her difficulty in adhering to a structured eating plan.  She describes a pattern of trying various weight loss methods without seeing results, leading to frustration and giving up. She has previously lost weight by significantly reducing calorie intake and increasing exercise, but found the weight returned once she stopped these extreme measures. Her metabolism was measured at 1685, with a calculated resting metabolic rate of 6237.  She engages in high-intensity training for 45  minutes, three times a week, and works as a Furniture conservator/restorer, indicating an active lifestyle. Despite this, she struggles with weight management, which she attributes to emotional eating and stress.          PHYSICAL EXAM:  Blood pressure 114/77, pulse 98, temperature 98 F (36.7 C), height 5\' 2"  (1.575 m), weight 194 lb (88 kg), SpO2 98%. Body mass index is 35.48 kg/m.  DIAGNOSTIC DATA REVIEWED:  BMET    Component Value Date/Time   NA 141 09/21/2022 1253   K 4.0 09/21/2022 1253   CL 101 09/21/2022 1253   CO2 22 09/21/2022 1253   GLUCOSE 84 09/21/2022 1253   GLUCOSE 88 01/26/2022 1559   BUN 5 (L) 09/21/2022 1253   CREATININE 0.97 09/21/2022 1253   CALCIUM 9.6 09/21/2022 1253   Lab Results  Component Value Date   HGBA1C 5.5 03/02/2023   HGBA1C 5.7 01/26/2022   Lab Results  Component Value Date   INSULIN 8.5 03/02/2023   INSULIN 9.1 09/21/2022   Lab Results  Component Value Date   TSH 1.170 09/21/2022   CBC    Component Value Date/Time   WBC 6.6 09/21/2022 1253   WBC 8.2 01/26/2022 1559   RBC 4.94 09/21/2022 1253   RBC 4.48 01/26/2022 1559   HGB 14.5 09/21/2022 1253   HCT 43.6 09/21/2022 1253   PLT 285 09/21/2022 1253   MCV 88 09/21/2022 1253   MCH 29.4 09/21/2022 1253   MCH 29.2 10/09/2015 0551   MCHC 33.3 09/21/2022 1253   MCHC 33.2 01/26/2022 1559   RDW 12.7 09/21/2022 1253  Iron Studies    Component Value Date/Time   FERRITIN 34 07/15/2015 0903   Lipid Panel     Component Value Date/Time   CHOL 174 09/21/2022 1253   TRIG 83 09/21/2022 1253   HDL 66 09/21/2022 1253   CHOLHDL 2.6 09/21/2022 1253   CHOLHDL 3 01/26/2022 1559   VLDL 21.4 01/26/2022 1559   LDLCALC 93 09/21/2022 1253   Hepatic Function Panel     Component Value Date/Time   PROT 6.9 09/21/2022 1253   ALBUMIN 4.3 09/21/2022 1253   AST 24 09/21/2022 1253   ALT 32 09/21/2022 1253   ALKPHOS 74 09/21/2022 1253   BILITOT 0.3 09/21/2022 1253      Component Value  Date/Time   TSH 1.170 09/21/2022 1253   Nutritional Lab Results  Component Value Date   VD25OH 51.6 03/02/2023   VD25OH 45.0 09/21/2022     Assessment and Plan    Obesity and EEB Presents for follow-up on obesity management. Gained 15 pounds in the last five months, not following a structured eating plan. Discussed neurologic hunger and body's mechanisms to fight weight loss. Emphasized adequate nutrition, particularly protein intake, to prevent the body from fighting weight loss. Discussed potential use of GLP-1 agonists for weight management, including risks such as nausea, vomiting, and potential for pancreatitis. Explained that proper nutrition is crucial to avoid malnourishment-related side effects like hair loss and 'Ozempic face'. Discussed insurance coverage issues and potential costs, with copays ranging from $50 to $500. - Initiate Wegovy .25 dose weekly, pending insurance approval. - Submit prior authorization for Wegovy. - Instruct to follow a journaling plan using MyFitnessPal, with a calorie range of 1200-1400 kcal/day and a protein goal of at least 80 grams/day. - Provide a list of high-protein foods. - Schedule follow-up in 3 weeks to assess progress and adjust treatment as needed.  Prediabetes Prediabetes with a most recent hemoglobin A1c of 5.5, indicating good control. Previously on metformin without significant benefit. - Monitor hemoglobin A1c levels periodically. - Continue lifestyle modifications including diet and exercise.  General Health Maintenance Discussed the importance of maintaining adequate nutrition and exercise. Emphasized the need for consistent protein intake and avoiding excessive calorie restriction. - Encourage continuation of high-intensity training, 45 minutes three times per week. - Advise to avoid cutting calories too low to prevent metabolic slowdown.  Follow-up - Follow up in 3 weeks to assess progress with [facility] and journaling plan. -  Send MyChart message by Thursday if no update on Wegovy prior authorization.         I have personally spent 44 minutes total time today in preparation, patient care, and documentation for this visit, including the following: review of clinical lab tests; review of medical tests/procedures/services.    She was informed of the importance of frequent follow up visits to maximize her success with intensive lifestyle modifications for her multiple health conditions.    Quillian Quince, MD

## 2023-07-26 DIAGNOSIS — F339 Major depressive disorder, recurrent, unspecified: Secondary | ICD-10-CM | POA: Diagnosis not present

## 2023-07-26 DIAGNOSIS — F411 Generalized anxiety disorder: Secondary | ICD-10-CM | POA: Diagnosis not present

## 2023-07-27 ENCOUNTER — Encounter (INDEPENDENT_AMBULATORY_CARE_PROVIDER_SITE_OTHER): Payer: Self-pay | Admitting: Family Medicine

## 2023-08-01 ENCOUNTER — Telehealth (INDEPENDENT_AMBULATORY_CARE_PROVIDER_SITE_OTHER): Payer: Self-pay | Admitting: *Deleted

## 2023-08-01 NOTE — Telephone Encounter (Signed)
 PA HAS BEEN DENIED, PATIENT NOTIFIED VIA MYCHART.   Your patient will pay 100% of a discounted price for this medication. Any amount the patient pays will not apply to their deductible or out-of-pocket expenses

## 2023-08-09 DIAGNOSIS — F411 Generalized anxiety disorder: Secondary | ICD-10-CM | POA: Diagnosis not present

## 2023-08-09 DIAGNOSIS — F339 Major depressive disorder, recurrent, unspecified: Secondary | ICD-10-CM | POA: Diagnosis not present

## 2023-08-16 ENCOUNTER — Ambulatory Visit (INDEPENDENT_AMBULATORY_CARE_PROVIDER_SITE_OTHER): Admitting: Family Medicine

## 2023-08-16 ENCOUNTER — Encounter (INDEPENDENT_AMBULATORY_CARE_PROVIDER_SITE_OTHER): Payer: Self-pay | Admitting: Family Medicine

## 2023-08-16 VITALS — BP 99/62 | HR 55 | Temp 98.8°F | Ht 62.0 in | Wt 191.0 lb

## 2023-08-16 DIAGNOSIS — F5089 Other specified eating disorder: Secondary | ICD-10-CM

## 2023-08-16 DIAGNOSIS — E669 Obesity, unspecified: Secondary | ICD-10-CM | POA: Diagnosis not present

## 2023-08-16 DIAGNOSIS — Z6834 Body mass index (BMI) 34.0-34.9, adult: Secondary | ICD-10-CM | POA: Diagnosis not present

## 2023-08-16 DIAGNOSIS — F3289 Other specified depressive episodes: Secondary | ICD-10-CM

## 2023-08-16 DIAGNOSIS — J309 Allergic rhinitis, unspecified: Secondary | ICD-10-CM | POA: Diagnosis not present

## 2023-08-16 MED ORDER — TOPIRAMATE 50 MG PO TABS: ORAL_TABLET | ORAL | 0 refills | Status: DC

## 2023-08-16 NOTE — Progress Notes (Signed)
 Office: [phone]  /  Fax: [phone]  WEIGHT SUMMARY AND BIOMETRICS  Anthropometric Measurements Height: 5\' 2"  (1.575 m) Weight: 191 lb (86.6 kg) BMI (Calculated): 34.93 Weight at Last Visit: 194 lb Weight Lost Since Last Visit: 3 lb Weight Gained Since Last Visit: 0 Starting Weight: 179 lb Total Weight Loss (lbs): 0 lb (0 kg)   Body Composition  Body Fat %: 4.8 % Fat Mass (lbs): 78 lbs Muscle Mass (lbs): 107.2 lbs Total Body Water (lbs): 75.6 lbs Visceral Fat Rating : 9   Other Clinical Data Fasting: No Labs: No Today's Visit #: 8 Starting Date: 09/21/22    Chief Complaint: OBESITY    History of Present Illness   Shawna Craig is a 38 year old female who presents for obesity treatment.  She is following a journaling food plan aiming to keep her calories between 1200 and 1480, with a focus on consuming 80 or more grams of protein. She succeeds with this plan about 40% of the time.  She engages in cardio and high-intensity training for 30 to 60 minutes, five times per week, resulting in a weight loss of three pounds over the last three weeks.  She describes her hunger as more psychological, noting obsessive thoughts about journaling and eating healthy. She feels that if she makes a mistake, she becomes upset and loses motivation. She wishes for a solution to help her not think about food constantly, as she is not physically starving throughout the day.  She reports issues with insurance coverage for [facility], which was not approved, impacting her treatment plan.  She is currently taking bupropion and Zoloft, and she is on the pill for birth control, which she takes consistently every night.          PHYSICAL EXAM:  Blood pressure 99/62, pulse (!) 55, temperature 98.8 F (37.1 C), height 5\' 2"  (1.575 m), weight 191 lb (86.6 kg), SpO2 98%. Body mass index is 34.93 kg/m.  DIAGNOSTIC DATA REVIEWED:  BMET    Component Value Date/Time   NA 141  09/21/2022 1253   K 4.0 09/21/2022 1253   CL 101 09/21/2022 1253   CO2 22 09/21/2022 1253   GLUCOSE 84 09/21/2022 1253   GLUCOSE 88 01/26/2022 1559   BUN 5 (L) 09/21/2022 1253   CREATININE 0.97 09/21/2022 1253   CALCIUM 9.6 09/21/2022 1253   Lab Results  Component Value Date   HGBA1C 5.5 03/02/2023   HGBA1C 5.7 01/26/2022   Lab Results  Component Value Date   INSULIN 8.5 03/02/2023   INSULIN 9.1 09/21/2022   Lab Results  Component Value Date   TSH 1.170 09/21/2022   CBC    Component Value Date/Time   WBC 6.6 09/21/2022 1253   WBC 8.2 01/26/2022 1559   RBC 4.94 09/21/2022 1253   RBC 4.48 01/26/2022 1559   HGB 14.5 09/21/2022 1253   HCT 43.6 09/21/2022 1253   PLT 285 09/21/2022 1253   MCV 88 09/21/2022 1253   MCH 29.4 09/21/2022 1253   MCH 29.2 10/09/2015 0551   MCHC 33.3 09/21/2022 1253   MCHC 33.2 01/26/2022 1559   RDW 12.7 09/21/2022 1253   Iron Studies    Component Value Date/Time   FERRITIN 34 07/15/2015 0903   Lipid Panel     Component Value Date/Time   CHOL 174 09/21/2022 1253   TRIG 83 09/21/2022 1253   HDL 66 09/21/2022 1253   CHOLHDL 2.6 09/21/2022 1253   CHOLHDL 3 01/26/2022 1559  VLDL 21.4 01/26/2022 1559   LDLCALC 93 09/21/2022 1253   Hepatic Function Panel     Component Value Date/Time   PROT 6.9 09/21/2022 1253   ALBUMIN 4.3 09/21/2022 1253   AST 24 09/21/2022 1253   ALT 32 09/21/2022 1253   ALKPHOS 74 09/21/2022 1253   BILITOT 0.3 09/21/2022 1253      Component Value Date/Time   TSH 1.170 09/21/2022 1253   Nutritional Lab Results  Component Value Date   VD25OH 51.6 03/02/2023   VD25OH 45.0 09/21/2022     Assessment and Plan    Obesity   and Emotional Eating Behaviors She faces challenges with weight management, achieving her journaling food plan 40% of the time. Regular exercise includes cardio and high-intensity training for 30-60 minutes, five times a week, resulting in a three-pound weight loss over the last three  weeks. Insurance did not cover [place], but alternative options are available. 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 Discussed the importance of journaling and the psychological aspects of hunger, including brain hunger, stomach hunger, and gut hormone hunger. Topiramate was chosen to address neurologic hunger and cravings, with potential side effects including tiredness and taste disturbances. It is part of a medication FDA-approved for weight loss, effective for emotional eating and binge eating, and can be covered by insurance. Advised on the importance of hydration to prevent kidney stones and the need for effective birth control due to the risk of cleft lip deformities if pregnant.   - Prescribe topiramate 50 mg at bedtime for neurologic hunger and cravings.   - Continue journaling food intake and exercise regimen.   - Continue bupropion and Zoloft as they synergize with the new medication.   - Ensure effective birth control and use condoms for a month if any doses are missed.    Allergic Rhinitis with Afrin Dependence   She reports severe allergies and has developed a dependency on Afrin nasal spray. Discussed the risks of Afrin dependency and the benefits of using a topical nasal steroid instead of oral prednisone due to its weight gain side effects. Flonase was recommended as it provides 80% of the benefit of prednisone without the negatives.   - Recommend Flonase nasal spray, two sprays in each nostril twice a day for three days, then once a day.   - Advise to avoid Afrin nasal spray to prevent dependency.    General Health Maintenance   She is on birth control pills and is compliant with her regimen. Discussed the importance of not getting pregnant while on topiramate due to the risk of cleft lip deformities.   - Continue birth control pills and ensure compliance.    Follow-up   She needs monitoring for the effectiveness of the new medication and any potential  side effects.   - Schedule follow-up appointment in 3-4 weeks.   - Advise to send a MyChart message if any concerns or issues arise.        She was informed of the importance of frequent follow up visits to maximize her success with intensive lifestyle modifications for her multiple health conditions.    Quillian Quince, MD

## 2023-08-23 DIAGNOSIS — F411 Generalized anxiety disorder: Secondary | ICD-10-CM | POA: Diagnosis not present

## 2023-08-23 DIAGNOSIS — F339 Major depressive disorder, recurrent, unspecified: Secondary | ICD-10-CM | POA: Diagnosis not present

## 2023-08-24 DIAGNOSIS — F331 Major depressive disorder, recurrent, moderate: Secondary | ICD-10-CM | POA: Diagnosis not present

## 2023-08-24 DIAGNOSIS — F411 Generalized anxiety disorder: Secondary | ICD-10-CM | POA: Diagnosis not present

## 2023-08-24 DIAGNOSIS — F5105 Insomnia due to other mental disorder: Secondary | ICD-10-CM | POA: Diagnosis not present

## 2023-09-13 DIAGNOSIS — F339 Major depressive disorder, recurrent, unspecified: Secondary | ICD-10-CM | POA: Diagnosis not present

## 2023-09-13 DIAGNOSIS — F411 Generalized anxiety disorder: Secondary | ICD-10-CM | POA: Diagnosis not present

## 2023-09-14 ENCOUNTER — Encounter (INDEPENDENT_AMBULATORY_CARE_PROVIDER_SITE_OTHER): Payer: Self-pay | Admitting: Family Medicine

## 2023-09-20 DIAGNOSIS — F339 Major depressive disorder, recurrent, unspecified: Secondary | ICD-10-CM | POA: Diagnosis not present

## 2023-09-20 DIAGNOSIS — F411 Generalized anxiety disorder: Secondary | ICD-10-CM | POA: Diagnosis not present

## 2023-09-21 ENCOUNTER — Ambulatory Visit (INDEPENDENT_AMBULATORY_CARE_PROVIDER_SITE_OTHER): Admitting: Family Medicine

## 2023-09-21 VITALS — BP 116/82 | HR 100 | Temp 98.4°F | Ht 62.0 in | Wt 192.0 lb

## 2023-09-21 DIAGNOSIS — E559 Vitamin D deficiency, unspecified: Secondary | ICD-10-CM | POA: Insufficient documentation

## 2023-09-21 DIAGNOSIS — R7303 Prediabetes: Secondary | ICD-10-CM

## 2023-09-21 DIAGNOSIS — Z6835 Body mass index (BMI) 35.0-35.9, adult: Secondary | ICD-10-CM

## 2023-09-21 DIAGNOSIS — F3289 Other specified depressive episodes: Secondary | ICD-10-CM

## 2023-09-21 DIAGNOSIS — F5089 Other specified eating disorder: Secondary | ICD-10-CM

## 2023-09-21 DIAGNOSIS — E669 Obesity, unspecified: Secondary | ICD-10-CM

## 2023-09-21 DIAGNOSIS — R4589 Other symptoms and signs involving emotional state: Secondary | ICD-10-CM | POA: Diagnosis not present

## 2023-09-21 MED ORDER — BUPROPION HCL ER (SR) 200 MG PO TB12: 200.0000 mg | ORAL_TABLET | Freq: Two times a day (BID) | ORAL | 0 refills | Status: DC

## 2023-09-21 NOTE — Progress Notes (Signed)
 Office: [phone]  /  Fax: [phone]  WEIGHT SUMMARY AND BIOMETRICS  Anthropometric Measurements Height: 5\' 2"  (1.575 m) Weight: 192 lb (87.1 kg) BMI (Calculated): 35.11 Weight at Last Visit: 191 lb Weight Lost Since Last Visit: 0 Weight Gained Since Last Visit: 1 lb Starting Weight: 179 lb Total Weight Loss (lbs): 0 lb (0 kg)   Body Composition  Body Fat %: 40.3 % Fat Mass (lbs): 77.4 lbs Muscle Mass (lbs): 109 lbs Total Body Water (lbs): 77.6 lbs Visceral Fat Rating : 9   Other Clinical Data Fasting: No Labs: No Today's Visit #: 9 Starting Date: 09/21/22    Chief Complaint: OBESITY    History of Present Illness Shawna Craig is a 38 year old female with obesity and prediabetes who presents for obesity treatment and progress assessment.  She follows her prescribed journaling plan about fifty percent of the time and exercises for thirty minutes three to four times a week. Despite these efforts, she has gained one pound in the last month since her last visit. Previously, she was prescribed topiramate  to help with emotional eating behaviors but discontinued it due to side effects and her preference to continue consuming carbonated liquids. She is interested in exploring other treatment options.  She has a history of prediabetes, which is currently being managed with diet and exercise. Her last recorded hemoglobin A1c level was 5.5, indicating good control.  She also has a history of vitamin D  deficiency, which is controlled, with her last lab result being 51.6.  During the visit, she mentioned seeing a counselor who suggested she might have ADHD. She completed a questionnaire and scored high, which prompted her to consider further evaluation. She recalls difficulties in school with concentration and following instructions, often feeling 'dumb' or 'lazy' despite efforts to apply herself.      PHYSICAL EXAM:  Blood pressure 116/82, pulse 100, temperature  98.4 F (36.9 C), height 5\' 2"  (1.575 m), weight 192 lb (87.1 kg), SpO2 99%. Body mass index is 35.12 kg/m.  DIAGNOSTIC DATA REVIEWED:  BMET    Component Value Date/Time   NA 141 09/21/2022 1253   K 4.0 09/21/2022 1253   CL 101 09/21/2022 1253   CO2 22 09/21/2022 1253   GLUCOSE 84 09/21/2022 1253   GLUCOSE 88 01/26/2022 1559   BUN 5 (L) 09/21/2022 1253   CREATININE 0.97 09/21/2022 1253   CALCIUM 9.6 09/21/2022 1253   Lab Results  Component Value Date   HGBA1C 5.5 03/02/2023   HGBA1C 5.7 01/26/2022   Lab Results  Component Value Date   INSULIN  8.5 03/02/2023   INSULIN  9.1 09/21/2022   Lab Results  Component Value Date   TSH 1.170 09/21/2022   CBC    Component Value Date/Time   WBC 6.6 09/21/2022 1253   WBC 8.2 01/26/2022 1559   RBC 4.94 09/21/2022 1253   RBC 4.48 01/26/2022 1559   HGB 14.5 09/21/2022 1253   HCT 43.6 09/21/2022 1253   PLT 285 09/21/2022 1253   MCV 88 09/21/2022 1253   MCH 29.4 09/21/2022 1253   MCH 29.2 10/09/2015 0551   MCHC 33.3 09/21/2022 1253   MCHC 33.2 01/26/2022 1559   RDW 12.7 09/21/2022 1253   Iron  Studies    Component Value Date/Time   FERRITIN 34 07/15/2015 0903   Lipid Panel     Component Value Date/Time   CHOL 174 09/21/2022 1253   TRIG 83 09/21/2022 1253   HDL 66 09/21/2022 1253   CHOLHDL 2.6  09/21/2022 1253   CHOLHDL 3 01/26/2022 1559   VLDL 21.4 01/26/2022 1559   LDLCALC 93 09/21/2022 1253   Hepatic Function Panel     Component Value Date/Time   PROT 6.9 09/21/2022 1253   ALBUMIN 4.3 09/21/2022 1253   AST 24 09/21/2022 1253   ALT 32 09/21/2022 1253   ALKPHOS 74 09/21/2022 1253   BILITOT 0.3 09/21/2022 1253      Component Value Date/Time   TSH 1.170 09/21/2022 1253   Nutritional Lab Results  Component Value Date   VD25OH 51.6 03/02/2023   VD25OH 45.0 09/21/2022     Assessment and Plan Assessment & Plan Obesity and Emotional Eating behaviors Obesity with emotional eating behaviors. Previous  treatment with topiramate  was discontinued due to side effects and preference to continue consuming carbonated beverages. Discussed alternative treatment with naltrexone, which blocks receptors associated with food enjoyment, potentially reducing overeating. - We will look at naltrexone as a possibility in the future, but will increase her Wellbutrin for both possible ADHD and emotional eating behaviors and reassess in 1 month. Continue following either her cat 2 eating plan or 1200 kcal /80+ gm protein journaling plan,. - exercise as tolerated  Signs of ADHD Signs of ADHD noted during counseling sessions. Reports difficulty with concentration and organization, which may contribute to challenges in weight management. Currently on [name], which may help with ADHD symptoms. Discussed increasing Wellbutrin SR to 200 mg BID to potentially improve symptoms. Referral to behavioral health for formal ADHD evaluation is planned. Discussed potential benefits of Strattera for ADHD and its impact on organization and focus, which may aid in meal planning and weight management. Consideration of controlled substances for ADHD management if necessary, pending specialist evaluation. - Increase Wellbutrin SR to 200 mg BID. - Refer to behavioral health for formal ADHD evaluation.  Vit D deficiency -recent level at goal, no additional tretment needed until labs are checked again in 2-3 months.  Prediabetes Prediabetes managed with diet and exercise. Last hemoglobin A1c was 5.5%. -Continue to work on diet, exercise and weight loss to treat.   She was informed of the importance of frequent follow up visits to maximize her success with intensive lifestyle modifications for her multiple health conditions.    Jasmine Mesi, MD

## 2023-09-27 DIAGNOSIS — F339 Major depressive disorder, recurrent, unspecified: Secondary | ICD-10-CM | POA: Diagnosis not present

## 2023-09-27 DIAGNOSIS — F411 Generalized anxiety disorder: Secondary | ICD-10-CM | POA: Diagnosis not present

## 2023-09-29 ENCOUNTER — Encounter (INDEPENDENT_AMBULATORY_CARE_PROVIDER_SITE_OTHER): Payer: Self-pay | Admitting: Family Medicine

## 2023-10-16 ENCOUNTER — Other Ambulatory Visit (INDEPENDENT_AMBULATORY_CARE_PROVIDER_SITE_OTHER): Payer: Self-pay | Admitting: Family Medicine

## 2023-10-16 DIAGNOSIS — F3289 Other specified depressive episodes: Secondary | ICD-10-CM

## 2023-10-30 ENCOUNTER — Encounter (INDEPENDENT_AMBULATORY_CARE_PROVIDER_SITE_OTHER): Payer: Self-pay | Admitting: Family Medicine

## 2023-11-09 ENCOUNTER — Encounter (INDEPENDENT_AMBULATORY_CARE_PROVIDER_SITE_OTHER): Payer: Self-pay | Admitting: Family Medicine

## 2023-11-09 ENCOUNTER — Other Ambulatory Visit (INDEPENDENT_AMBULATORY_CARE_PROVIDER_SITE_OTHER): Payer: Self-pay | Admitting: Family Medicine

## 2023-11-09 ENCOUNTER — Ambulatory Visit (INDEPENDENT_AMBULATORY_CARE_PROVIDER_SITE_OTHER): Admitting: Family Medicine

## 2023-11-09 VITALS — BP 103/71 | HR 89 | Temp 98.0°F | Ht 62.0 in | Wt 195.0 lb

## 2023-11-09 DIAGNOSIS — R7303 Prediabetes: Secondary | ICD-10-CM

## 2023-11-09 DIAGNOSIS — E669 Obesity, unspecified: Secondary | ICD-10-CM

## 2023-11-09 DIAGNOSIS — F32A Depression, unspecified: Secondary | ICD-10-CM

## 2023-11-09 DIAGNOSIS — F3289 Other specified depressive episodes: Secondary | ICD-10-CM

## 2023-11-09 DIAGNOSIS — F5089 Other specified eating disorder: Secondary | ICD-10-CM

## 2023-11-09 DIAGNOSIS — Z6835 Body mass index (BMI) 35.0-35.9, adult: Secondary | ICD-10-CM

## 2023-11-09 MED ORDER — BUPROPION HCL ER (SR) 200 MG PO TB12
200.0000 mg | ORAL_TABLET | Freq: Two times a day (BID) | ORAL | 0 refills | Status: DC
Start: 2023-11-09 — End: 2024-02-28

## 2023-11-09 MED ORDER — ZEPBOUND 2.5 MG/0.5ML ~~LOC~~ SOAJ: 2.5000 mg | SUBCUTANEOUS | 0 refills | Status: DC

## 2023-11-09 NOTE — Progress Notes (Signed)
 Office: [phone]  /  Fax: [phone]  WEIGHT SUMMARY AND BIOMETRICS  Anthropometric Measurements Height: 5' 2 (1.575 m) Weight: 195 lb (88.5 kg) BMI (Calculated): 35.66 Weight at Last Visit: 192 lb Weight Lost Since Last Visit: 0 Weight Gained Since Last Visit: 3 lb Starting Weight: 179 lb Total Weight Loss (lbs): 0 lb (0 kg)   Body Composition  Body Fat %: 43 % Fat Mass (lbs): 84 lbs Muscle Mass (lbs): 105.8 lbs Total Body Water (lbs): 77.8 lbs Visceral Fat Rating : 10   Other Clinical Data Fasting: Yes Labs: No Today's Visit #: 10 Starting Date: 09/21/22    Chief Complaint: OBESITY   Discussed the use of AI scribe software for clinical note transcription with the patient, who gave verbal consent to proceed.  History of Present Illness Shawna Craig is a 38 year old female who presents for obesity treatment.  She is currently managing her obesity with a plan that includes either a category two diet or journaling 1200 calories a day with 80 or more grams of protein, which she follows about 40% of the time. Despite engaging in a combination of strength and cardio exercises for 30 to 60 minutes, three to four times a week, she has gained three pounds recently.  She is experiencing increased stress due to the recent passing of her grandmother, affecting her eating habits with episodes of both stress eating and stress not eating. She is also starting her menstrual period, which may contribute to weight fluctuations.  She is not taking topiramate  due to side effects and has not been tested for ADHD despite previous discussions. She wants to understand her struggles with weight management better.  She is concerned about her family history of diabetes, noting her father is prediabetic despite being slim. Her A1c was 5.7 a couple of years ago, and she has tried metformin  in the past without benefit.  She currently takes Wellbutrin  200 mg twice a day, which she  feels may help with cravings. She experiences hunger later in the afternoon to evening, even when following her eating plan. She has tried [place] for weight loss but was not approved by insurance.      PHYSICAL EXAM:  Blood pressure 103/71, pulse 89, temperature 98 F (36.7 C), height 5' 2 (1.575 m), weight 195 lb (88.5 kg), SpO2 98%. Body mass index is 35.67 kg/m.  DIAGNOSTIC DATA REVIEWED:  BMET    Component Value Date/Time   NA 141 09/21/2022 1253   K 4.0 09/21/2022 1253   CL 101 09/21/2022 1253   CO2 22 09/21/2022 1253   GLUCOSE 84 09/21/2022 1253   GLUCOSE 88 01/26/2022 1559   BUN 5 (L) 09/21/2022 1253   CREATININE 0.97 09/21/2022 1253   CALCIUM 9.6 09/21/2022 1253   Lab Results  Component Value Date   HGBA1C 5.5 03/02/2023   HGBA1C 5.7 01/26/2022   Lab Results  Component Value Date   INSULIN  8.5 03/02/2023   INSULIN  9.1 09/21/2022   Lab Results  Component Value Date   TSH 1.170 09/21/2022   CBC    Component Value Date/Time   WBC 6.6 09/21/2022 1253   WBC 8.2 01/26/2022 1559   RBC 4.94 09/21/2022 1253   RBC 4.48 01/26/2022 1559   HGB 14.5 09/21/2022 1253   HCT 43.6 09/21/2022 1253   PLT 285 09/21/2022 1253   MCV 88 09/21/2022 1253   MCH 29.4 09/21/2022 1253   MCH 29.2 10/09/2015 0551   MCHC 33.3 09/21/2022 1253  MCHC 33.2 01/26/2022 1559   RDW 12.7 09/21/2022 1253   Iron  Studies    Component Value Date/Time   FERRITIN 34 07/15/2015 0903   Lipid Panel     Component Value Date/Time   CHOL 174 09/21/2022 1253   TRIG 83 09/21/2022 1253   HDL 66 09/21/2022 1253   CHOLHDL 2.6 09/21/2022 1253   CHOLHDL 3 01/26/2022 1559   VLDL 21.4 01/26/2022 1559   LDLCALC 93 09/21/2022 1253   Hepatic Function Panel     Component Value Date/Time   PROT 6.9 09/21/2022 1253   ALBUMIN 4.3 09/21/2022 1253   AST 24 09/21/2022 1253   ALT 32 09/21/2022 1253   ALKPHOS 74 09/21/2022 1253   BILITOT 0.3 09/21/2022 1253      Component Value Date/Time   TSH  1.170 09/21/2022 1253   Nutritional Lab Results  Component Value Date   VD25OH 51.6 03/02/2023   VD25OH 45.0 09/21/2022     Assessment and Plan Assessment & Plan Obesity Obesity management is the primary focus. She follows a 1200-calorie diet with 80 grams of protein about 40% of the time and exercises 3-4 times weekly. Despite these efforts, she gained 3 pounds, possibly due to stress and menstrual cycle. She is not on topiramate  due to side effects and is interested in medication options for physical hunger. With a family history of diabetes, she is concerned about prediabetes. Discussed Zepbound, which is more effective with fewer side effects than Wegovy, for weight management. Insurance prior authorization will be initiated for Zepbound, noting her prediabetes status. Discussed low carb dietary options, emphasizing strict adherence to prevent insulin  response and promote fat burning. - Provide a copy of the clinic's low carb plan. - Initiate prior authorization for Zepbound for weight management. - Restart Wellbutrin  200 mg twice daily and send prescription to [facility] on [address]. - Encourage follow-up with Dr. Sharron for ADHD evaluation and support. - Schedule follow-up appointment in 3-4 weeks.  Prediabetes With a family history of diabetes and a previous A1c of 5.7%, she is in the prediabetes range. She is concerned about diabetes risk and is managing it through diet and exercise. Insurance prior authorization for Zepbound will include documentation of her prediabetes status and efforts in diet and exercise. - Document prediabetes status and efforts in diet and exercise in the notes for insurance purposes. - Initiate prior authorization for Zepbound, noting prediabetes status.  Depression with Emotional Eating Behaviors She experiences depression with emotional eating, exacerbated by recent stressors including her grandmother's passing. Wellbutrin  helps with cravings. Discussed  restarting Wellbutrin  200 mg twice daily to manage symptoms. - Restart Wellbutrin  200 mg twice daily and send [facility] to [facility] [address]. - Encourage follow-up with Dr. Sharron for additional support.   She was informed of the importance of frequent follow up visits to maximize her success with intensive lifestyle modifications for her multiple health conditions.    Louann Penton, MD

## 2023-11-29 ENCOUNTER — Ambulatory Visit (INDEPENDENT_AMBULATORY_CARE_PROVIDER_SITE_OTHER): Admitting: Psychology

## 2023-12-03 ENCOUNTER — Other Ambulatory Visit: Payer: Self-pay | Admitting: Obstetrics and Gynecology

## 2023-12-03 DIAGNOSIS — Z3041 Encounter for surveillance of contraceptive pills: Secondary | ICD-10-CM

## 2023-12-06 ENCOUNTER — Ambulatory Visit: Admitting: Family Medicine

## 2023-12-07 ENCOUNTER — Ambulatory Visit (INDEPENDENT_AMBULATORY_CARE_PROVIDER_SITE_OTHER): Admitting: Family Medicine

## 2023-12-26 DIAGNOSIS — F5105 Insomnia due to other mental disorder: Secondary | ICD-10-CM | POA: Diagnosis not present

## 2023-12-26 DIAGNOSIS — F331 Major depressive disorder, recurrent, moderate: Secondary | ICD-10-CM | POA: Diagnosis not present

## 2023-12-26 DIAGNOSIS — F411 Generalized anxiety disorder: Secondary | ICD-10-CM | POA: Diagnosis not present

## 2024-01-10 ENCOUNTER — Ambulatory Visit (INDEPENDENT_AMBULATORY_CARE_PROVIDER_SITE_OTHER): Admitting: Family Medicine

## 2024-02-16 ENCOUNTER — Other Ambulatory Visit: Payer: Self-pay

## 2024-02-16 DIAGNOSIS — Z3041 Encounter for surveillance of contraceptive pills: Secondary | ICD-10-CM

## 2024-02-16 MED ORDER — NORGESTIMATE-ETH ESTRADIOL 0.25-35 MG-MCG PO TABS: 1.0000 | ORAL_TABLET | Freq: Every day | ORAL | 0 refills | Status: DC

## 2024-02-16 MED ORDER — NORETHIN ACE-ETH ESTRAD-FE 1.5-30 MG-MCG PO TABS
1.0000 | ORAL_TABLET | Freq: Every day | ORAL | 3 refills | Status: DC
Start: 2024-02-16 — End: 2024-02-28

## 2024-02-28 ENCOUNTER — Ambulatory Visit: Admitting: Obstetrics and Gynecology

## 2024-02-28 ENCOUNTER — Encounter: Payer: Self-pay | Admitting: Obstetrics and Gynecology

## 2024-02-28 VITALS — BP 109/72 | HR 84 | Ht 62.0 in | Wt 178.0 lb

## 2024-02-28 DIAGNOSIS — Z3041 Encounter for surveillance of contraceptive pills: Secondary | ICD-10-CM

## 2024-02-28 DIAGNOSIS — Z01419 Encounter for gynecological examination (general) (routine) without abnormal findings: Secondary | ICD-10-CM

## 2024-02-28 MED ORDER — NORETHIN ACE-ETH ESTRAD-FE 1.5-30 MG-MCG PO TABS
1.0000 | ORAL_TABLET | Freq: Every day | ORAL | 3 refills | Status: AC
Start: 2024-02-28 — End: ?

## 2024-02-28 NOTE — Patient Instructions (Signed)
 I value your feedback and you entrusting [place] with your care. If you get a [place] patient survey, I would appreciate you taking the time to let us know about your experience today. Thank you! ? ? ?

## 2024-02-28 NOTE — Progress Notes (Signed)
 PCP:  Randeen Laine LABOR, MD   Chief Complaint  Patient presents with   Gynecologic Exam    No concerns     HPI:      [name] is a 38 y.o. [id number] whose LMP was Patient's last menstrual period was 02/20/2024 (approximate)., presents today for her annual examination.  Her menses are regular every 28-30 days, lasting 2-4 days on OCPs.  Dysmenorrhea mild, no meds needed.  She does not have intermenstrual bleeding.  Sex activity: single partner, contraception - OCP (estrogen/progesterone). No pain/bleeding/dryness. Last Pap: 12/27/22 Results were NILM/neg HPV DNA. Repeat due after 3 yrs per [facility]. 12/15/21 Results were ASCUS/neg HPV DNA.  10/21/20 Results were normal/neg HPV DNA.  05/05/20 (6 mo repeat pap after colpo/bx 6/21)  Results were ASCUS/neg HPV DNA. CIN1 on colpo/bx with Dr. Arloa 6/21. 2020 neg per pt report; hx of HPV on pap 2019 (can't find copy of paps in chart) and 2021 Hx of STDs: ext genital warts, treated 9/20  There is no FH of breast cancer. There is no FH of ovarian cancer. The patient does not do self-breast exams.  Tobacco use: The patient denies current or previous tobacco use. Alcohol use: none No drug use.  Exercise: very active  She does get adequate calcium but not Vitamin D  in her diet. Labs with PCP  Past Medical History:  Diagnosis Date   Anxiety    Cervical high risk human papillomavirus (HPV) DNA test positive    Depression    Genital warts    Hidradenitis suppurativa    Vaccine for human papilloma virus (HPV) types 6, 11, 16, and 18 administered    Yeast vaginitis     Past Surgical History:  Procedure Laterality Date   TONSILLECTOMY AND ADENOIDECTOMY     2007   WISDOM TOOTH EXTRACTION      Family History  Problem Relation Age of Onset   Diabetes Mother    Obesity Mother    Diabetes Father    Breast cancer Neg Hx    Ovarian cancer Neg Hx     Social History   Socioeconomic History   Marital status: Widowed    Spouse  name: Alm   Number of children: 1   Years of education: Not on file   Highest education level: Not on file  Occupational History   Occupation: exercise teacher     Employer: [employer]  Tobacco Use   Smoking status: Never   Smokeless tobacco: Never  Vaping Use   Vaping status: Never Used  Substance and Sexual Activity   Alcohol use: No    Alcohol/week: 0.0 standard drinks of alcohol   Drug use: No   Sexual activity: Yes    Birth control/protection: Pill  Other Topics Concern   Not on file  Social History Narrative   Pt is married (Dr Marval daughter in Social worker) , and has a 68 mo old daughter   Stays home and also teaches exercise classes at the [employer] on finishing a degree on line    Social Drivers of Corporate investment banker Strain: Not on file  Food Insecurity: Not on file  Transportation Needs: Not on file  Physical Activity: Not on file  Stress: Not on file  Social Connections: Not on file  Intimate Partner Violence: Not on file     Current Outpatient Medications:    buPROPion  (WELLBUTRIN  SR) 150 MG 12 hr tablet, Take 150 mg by mouth 2 (  two) times daily., Disp: , Rfl:    fluticasone  (FLONASE ) 50 MCG/ACT nasal spray, SPRAY 2 SPRAYS INTO EACH NOSTRIL EVERY DAY, Disp: 48 mL, Rfl: 0   tirzepatide  (ZEPBOUND ) 2.5 MG/0.5ML Pen, Inject 2.5 mg into the skin once a week., Disp: 2 mL, Rfl: 0   norethindrone-ethinyl estradiol-iron  (JUNEL FE 1.5/30) 1.5-30 MG-MCG tablet, Take 1 tablet by mouth daily., Disp: 84 tablet, Rfl: 3     ROS:  Review of Systems  Constitutional:  Negative for fatigue, fever and unexpected weight change.  Respiratory:  Negative for cough, shortness of breath and wheezing.   Cardiovascular:  Negative for chest pain, palpitations and leg swelling.  Gastrointestinal:  Negative for blood in stool, constipation, diarrhea, nausea and vomiting.  Endocrine: Negative for cold intolerance, heat intolerance and polyuria.  Genitourinary:  Negative for  dyspareunia, dysuria, flank pain, frequency, genital sores, hematuria, menstrual problem, pelvic pain, urgency, vaginal bleeding, vaginal discharge and vaginal pain.  Musculoskeletal:  Negative for back pain, joint swelling and myalgias.  Skin:  Negative for rash.  Neurological:  Negative for dizziness, syncope, light-headedness, numbness and headaches.  Hematological:  Negative for adenopathy.  Psychiatric/Behavioral:  Positive for dysphoric mood. Negative for confusion, sleep disturbance and suicidal ideas. The patient is not nervous/anxious.   BREAST: No symptoms   Objective: BP 109/72   Pulse 84   Ht 5' 2 (1.575 m)   Wt 178 lb (80.7 kg)   LMP 02/20/2024 (Approximate)   BMI 32.56 kg/m    Physical Exam Constitutional:      Appearance: She is well-developed.  Genitourinary:     Vulva normal.     Right Labia: No rash, tenderness or lesions.    Left Labia: No tenderness, lesions or rash.    No vaginal discharge, erythema or tenderness.      Right Adnexa: not tender and no mass present.    Left Adnexa: not tender and no mass present.    No cervical friability or polyp.     Uterus is not enlarged or tender.  Breasts:    Right: No mass, nipple discharge, skin change or tenderness.     Left: No mass, nipple discharge, skin change or tenderness.  Neck:     Thyroid: No thyromegaly.  Cardiovascular:     Rate and Rhythm: Normal rate and regular rhythm.     Heart sounds: Normal heart sounds. No murmur heard. Pulmonary:     Effort: Pulmonary effort is normal.     Breath sounds: Normal breath sounds.  Abdominal:     Palpations: Abdomen is soft.     Tenderness: There is no abdominal tenderness. There is no guarding or rebound.  Musculoskeletal:        General: Normal range of motion.     Cervical back: Normal range of motion.  Lymphadenopathy:     Cervical: No cervical adenopathy.  Neurological:     General: No focal deficit present.     Mental Status: She is alert and  oriented to person, place, and time.     Cranial Nerves: No cranial nerve deficit.  Skin:    General: Skin is warm and dry.  Psychiatric:        Mood and Affect: Mood normal.        Behavior: Behavior normal.        Thought Content: Thought content normal.        Judgment: Judgment normal.  Vitals reviewed.     Assessment/Plan: Encounter for annual routine gynecological examination  Encounter for surveillance of contraceptive pills - Plan: norethindrone-ethinyl estradiol-iron  (JUNEL FE 1.5/30) 1.5-30 MG-MCG tablet; Rx RF eRxd.    Meds ordered this encounter  Medications   norethindrone-ethinyl estradiol-iron  (JUNEL FE 1.5/30) 1.5-30 MG-MCG tablet    Sig: Take 1 tablet by mouth daily.    Dispense:  84 tablet    Refill:  3    Supervising Provider:   ROBY, MICIA [[id number]]             GYN counsel adequate intake of calcium and vitamin D , diet and exercise     F/U  Return in about 1 year (around 02/27/2025).  Shawna Simmers B. Shamere Campas, PA-C 02/28/2024 3:07 PM

## 2024-03-20 DIAGNOSIS — F411 Generalized anxiety disorder: Secondary | ICD-10-CM | POA: Diagnosis not present

## 2024-03-20 DIAGNOSIS — F5105 Insomnia due to other mental disorder: Secondary | ICD-10-CM | POA: Diagnosis not present

## 2024-03-20 DIAGNOSIS — F331 Major depressive disorder, recurrent, moderate: Secondary | ICD-10-CM | POA: Diagnosis not present

## 2024-06-06 ENCOUNTER — Encounter: Payer: Self-pay | Admitting: Family Medicine

## 2024-06-06 ENCOUNTER — Ambulatory Visit (INDEPENDENT_AMBULATORY_CARE_PROVIDER_SITE_OTHER): Admitting: Family Medicine

## 2024-06-06 VITALS — BP 110/66 | HR 70 | Temp 97.9°F | Ht 61.5 in | Wt 161.1 lb

## 2024-06-06 DIAGNOSIS — Z1283 Encounter for screening for malignant neoplasm of skin: Secondary | ICD-10-CM | POA: Insufficient documentation

## 2024-06-06 DIAGNOSIS — Z0000 Encounter for general adult medical examination without abnormal findings: Secondary | ICD-10-CM | POA: Diagnosis not present

## 2024-06-06 DIAGNOSIS — E663 Overweight: Secondary | ICD-10-CM

## 2024-06-06 DIAGNOSIS — R7303 Prediabetes: Secondary | ICD-10-CM | POA: Diagnosis not present

## 2024-06-06 DIAGNOSIS — E559 Vitamin D deficiency, unspecified: Secondary | ICD-10-CM | POA: Diagnosis not present

## 2024-06-06 DIAGNOSIS — E538 Deficiency of other specified B group vitamins: Secondary | ICD-10-CM | POA: Insufficient documentation

## 2024-06-06 DIAGNOSIS — R87810 Cervical high risk human papillomavirus (HPV) DNA test positive: Secondary | ICD-10-CM

## 2024-06-06 DIAGNOSIS — F32A Depression, unspecified: Secondary | ICD-10-CM | POA: Diagnosis not present

## 2024-06-06 NOTE — Assessment & Plan Note (Signed)
 Doing well under Care of psychiatry Continues wellbutrin  sr 150 mg bid   PHQ 0  Good self care  Has had counseling but not currently

## 2024-06-06 NOTE — Assessment & Plan Note (Signed)
 Pt is getting glp-1 (? Semaglutide 2.4 mg) weekly from a 3rd party clinic  Has done well / tolerates well Continues to work with this provider  Is aware of risks  Is strength training 3 or more days weekly to prevent muscle loss Discussed importance of protein intake today

## 2024-06-06 NOTE — Assessment & Plan Note (Signed)
 Not currently taking extra D  Is outdoors a lot   Level today  Discussed importance to bone and overall health

## 2024-06-06 NOTE — Progress Notes (Signed)
 "  Subjective:    Patient ID: Shawna Craig, female    DOB: 1985-12-16, 39 y.o.   MRN: [id number]  HPI  Here for health maintenance exam and to review chronic medical problems   Wt Readings from Last 3 Encounters:  06/06/24 161 lb 2 oz (73.1 kg)  02/28/24 178 lb (80.7 kg)  11/09/23 195 lb (88.5 kg)   29.95 kg/m  Vitals:   06/06/24 1543  BP: 110/66  Pulse: 70  Temp: 97.9 F (36.6 C)  SpO2: 100%    Immunization History  Administered Date(s) Administered   Influenza, Seasonal, Injecte, Preservative Fre 01/27/2016   Influenza,inj,Quad PF,6+ Mos 04/03/2013, 02/28/2018, 03/10/2020, 01/26/2022   Influenza-Unspecified 02/14/2014, 01/16/2015, 01/27/2016, 02/14/2017, 03/21/2021, 01/30/2024   Moderna Sars-Covid-2 Vaccination 07/02/2019, 07/30/2019   Tdap 07/17/2015   Varicella 04/19/2014    Health Maintenance Due  Topic Date Due   Hepatitis C Screening  Never done   Hepatitis B Vaccines 19-59 Average Risk (1 of 3 - 19+ 3-dose series) Never done    [facility] Did not end up taking glp   Now on glp-1 from 3rd party clinic , started in July  Semaglutide - ? 2.4 mg  Working with a PA   Eating well  Eating protein    Doing well  Work is good Kids are good    Flu shot in the fall    Self breast exam  Gyn health Pap 12/2022 , thinks had pap 2025  Normal paps  No longer HPV  Pas history of cervical dysplasia     Her gyn retired in December   OC junel fe 1.5/30 Menses - cramping is worse with age but periods only last 3 days  This works well for her   No history of blood clots  Not a smoker    No need for STD screening   Colon cancer screening -no family history  Bone health   Falls-none  Fractures-none  Supplements -has not been taking vit D  Is outdoors a lot however  Last vitamin D  Lab Results  Component Value Date   VD25OH 51.6 03/02/2023  History of D def   Exercise  Regularly  Weight lifting  - at least 3 days per week  Some HIIT   Goes to dermatology in [facility] Needs skin cancer screening      Mood    06/06/2024    3:48 PM 04/21/2023    3:24 PM 08/20/2022    8:09 AM 06/18/2022    8:56 AM 01/26/2022    4:11 PM  Depression screen PHQ 2/9  Decreased Interest 0 0 0 0 0  Down, Depressed, Hopeless 0 0 0 0 1  PHQ - 2 Score 0 0 0 0 1  Altered sleeping 0  1 0 1  Tired, decreased energy 0  0 0 1  Change in appetite 0  0 0 1  Feeling bad or failure about yourself  0  0 0 1  Trouble concentrating 0  0 0 0  Moving slowly or fidgety/restless 0  0 0 0  Suicidal thoughts 0  0 0 0  PHQ-9 Score 0  1  0  5   Difficult doing work/chores Not difficult at all  Not difficult at all Not difficult at all Somewhat difficult     Data saved with a previous flowsheet row definition   Depression  Wellbutrin  sr 150 mg bid  Doing very well  Sees Viviane Drone  Has not had counseling in a while   Prediabetes Lab Results  Component Value Date   HGBA1C 5.5 03/02/2023   HGBA1C 5.5 09/21/2022   HGBA1C 5.2 08/20/2022    B12 def Lab Results  Component Value Date   VITAMINB12 509 03/02/2023       Patient Active Problem List   Diagnosis Date Noted   Skin cancer screening 06/06/2024   B12 deficiency 06/06/2024   Vitamin D  deficiency 09/21/2023   Prediabetes 01/27/2022   Family history of diabetes mellitus 01/26/2022   Cervical high risk human papillomavirus (HPV) DNA test positive    Routine general medical examination at a health care facility 10/17/2018   Depression 04/12/2016   Hidradenitis suppurativa 02/03/2016   Overweight (BMI 25.0-29.9) 01/27/2016   Past Medical History:  Diagnosis Date   Anxiety    Cervical high risk human papillomavirus (HPV) DNA test positive    Depression    Genital warts    Hidradenitis suppurativa    Vaccine for human papilloma virus (HPV) types 6, 11, 16, and 18 administered    Yeast vaginitis    Past Surgical History:  Procedure Laterality  Date   TONSILLECTOMY AND ADENOIDECTOMY     2007   WISDOM TOOTH EXTRACTION     Social History[1] Family History  Problem Relation Age of Onset   Diabetes Mother    Obesity Mother    Diabetes Father    Breast cancer Neg Hx    Ovarian cancer Neg Hx    Allergies[2] Medications Ordered Prior to Encounter[3]  Review of Systems  Constitutional:  Negative for activity change, appetite change, fatigue, fever and unexpected weight change.       Very large appetite if not on glp-1  HENT:  Negative for congestion, ear pain, rhinorrhea, sinus pressure and sore throat.   Eyes:  Negative for pain, redness and visual disturbance.  Respiratory:  Negative for cough, shortness of breath and wheezing.   Cardiovascular:  Negative for chest pain and palpitations.  Gastrointestinal:  Negative for abdominal pain, blood in stool, constipation and diarrhea.  Endocrine: Negative for polydipsia and polyuria.  Genitourinary:  Negative for dysuria, frequency and urgency.  Musculoskeletal:  Negative for arthralgias, back pain and myalgias.  Skin:  Negative for pallor and rash.  Allergic/Immunologic: Negative for environmental allergies.  Neurological:  Negative for dizziness, syncope and headaches.  Hematological:  Negative for adenopathy. Does not bruise/bleed easily.  Psychiatric/Behavioral:  Negative for decreased concentration and dysphoric mood. The patient is not nervous/anxious.        Objective:   Physical Exam Constitutional:      General: She is not in acute distress.    Appearance: Normal appearance. She is well-developed. She is not ill-appearing or diaphoretic.     Comments: Overweight   HENT:     Head: Normocephalic and atraumatic.     Right Ear: Tympanic membrane, ear canal and external ear normal.     Left Ear: Tympanic membrane, ear canal and external ear normal.     Nose: Nose normal. No congestion.     Mouth/Throat:     Mouth: Mucous membranes are moist.     Pharynx: Oropharynx  is clear. No posterior oropharyngeal erythema.  Eyes:     General: No scleral icterus.    Extraocular Movements: Extraocular movements intact.     Conjunctiva/sclera: Conjunctivae normal.     Pupils: Pupils are equal, round, and reactive to light.  Neck:     Thyroid:  No thyromegaly.     Vascular: No carotid bruit or JVD.  Cardiovascular:     Rate and Rhythm: Normal rate and regular rhythm.     Pulses: Normal pulses.     Heart sounds: Normal heart sounds.     No gallop.  Pulmonary:     Effort: Pulmonary effort is normal. No respiratory distress.     Breath sounds: Normal breath sounds. No wheezing.     Comments: Good air exch Chest:     Chest wall: No tenderness.  Abdominal:     General: Bowel sounds are normal. There is no distension or abdominal bruit.     Palpations: Abdomen is soft. There is no mass.     Tenderness: There is no abdominal tenderness.     Hernia: No hernia is present.  Genitourinary:    Comments:  Breast and pelvic exam are done by gyn provider   Musculoskeletal:        General: No tenderness. Normal range of motion.     Cervical back: Normal range of motion and neck supple. No rigidity. No muscular tenderness.     Right lower leg: No edema.     Left lower leg: No edema.     Comments: No kyphosis   Lymphadenopathy:     Cervical: No cervical adenopathy.  Skin:    General: Skin is warm and dry.     Coloration: Skin is not pale.     Findings: No erythema or rash.     Comments: Solar lentigines diffusely   Neurological:     Mental Status: She is alert. Mental status is at baseline.     Cranial Nerves: No cranial nerve deficit.     Motor: No abnormal muscle tone.     Coordination: Coordination normal.     Gait: Gait normal.     Deep Tendon Reflexes: Reflexes are normal and symmetric. Reflexes normal.  Psychiatric:        Mood and Affect: Mood normal.        Cognition and Memory: Cognition and memory normal.           Assessment & Plan:    Problem List Items Addressed This Visit       Other   Vitamin D  deficiency   Not currently taking extra D  Is outdoors a lot   Level today  Discussed importance to bone and overall health      Relevant Orders   VITAMIN D  25 Hydroxy (Vit-D Deficiency, Fractures)   Skin cancer screening   Pt is unhappy with current dermatologist Referred to new practice at her request  Uses sun protection         Relevant Orders   Ambulatory referral to Dermatology   Routine general medical examination at a health care facility - Primary   Reviewed health habits including diet and exercise and skin cancer prevention Reviewed appropriate screening tests for age  Also reviewed health mt list, fam hx and immunization status , as well as social and family history   See HPI Labs reviewed and ordered Health Maintenance  Topic Date Due   Hepatitis C Screening  Never done   Hepatitis B Vaccine (1 of 3 - 19+ 3-dose series) Never done   COVID-19 Vaccine (3 - 2025-26 season) 05/16/2025*   DTaP/Tdap/Td vaccine (2 - Td or Tdap) 07/16/2025   Pap with HPV screening  12/27/2027   Flu Shot  Completed   HPV Vaccine (No Doses Required) Completed   HIV  Screening  Completed   Pneumococcal Vaccine  Aged Out   Meningitis B Vaccine  Aged Out  *Topic was postponed. The date shown is not the original due date.    Sent for last pap from gyn  Is changing her gyn care to us  unless otherwise indicated Continues junel fe 1.5/30  Discussed fall prevention, supplements and exercise for bone density  Encouraged continued strength training and protein intake in setting of glp-1 for weight loss  PHQ 0 with treatment  Lab today      Relevant Orders   Lipid Panel   TSH   CBC with Differential/Platelet   Comprehensive metabolic panel with GFR   Prediabetes   A1c ordered  Taking semaglutide Eating well Has lost significant weight       Relevant Orders   Hemoglobin A1c   Overweight (BMI 25.0-29.9)   Pt  is getting glp-1 (? Semaglutide 2.4 mg) weekly from a 3rd party clinic  Has done well / tolerates well Continues to work with this provider  Is aware of risks  Is strength training 3 or more days weekly to prevent muscle loss Discussed importance of protein intake today        Depression   Doing well under Care of psychiatry Continues wellbutrin  sr 150 mg bid   PHQ 0  Good self care  Has had counseling but not currently       Cervical high risk human papillomavirus (HPV) DNA test positive   In past  Sent for last pap from gyn   Per pt has had multiple negative paps with neg HPV  Wants to change gyn care to us  if that is the case since her gyn provider left the practice       B12 deficiency   Lab today      Relevant Orders   Vitamin B12       [1]  Social History Tobacco Use   Smoking status: Never   Smokeless tobacco: Never  Vaping Use   Vaping status: Never Used  Substance Use Topics   Alcohol use: No    Alcohol/week: 0.0 standard drinks of alcohol   Drug use: No  [2] No Known Allergies [3]  Current Outpatient Medications on File Prior to Visit  Medication Sig Dispense Refill   buPROPion  (WELLBUTRIN  SR) 150 MG 12 hr tablet Take 150 mg by mouth 2 (two) times daily.     norethindrone-ethinyl estradiol -iron  (JUNEL FE 1.5/30) 1.5-30 MG-MCG tablet Take 1 tablet by mouth daily. 84 tablet 3   semaglutide-weight management (WEGOVY) 2.4 MG/0.75ML SOAJ SQ injection Inject 2.4 mg into the skin once a week.     No current facility-administered medications on file prior to visit.   "

## 2024-06-06 NOTE — Assessment & Plan Note (Signed)
 Pt is unhappy with current dermatologist Referred to new practice at her request  Uses sun protection

## 2024-06-06 NOTE — Assessment & Plan Note (Signed)
 In past  Sent for last pap from gyn   Per pt has had multiple negative paps with neg HPV  Wants to change gyn care to us  if that is the case since her gyn provider left the practice

## 2024-06-06 NOTE — Patient Instructions (Addendum)
 Please continue the strength training   Keep up the protein intake  The following are examples of protein in diet  Meat (lean)  Fish  Eggs  Dairy products  Soy products  Oat milk  Almond milk Nuts and nut butters  Legumes  Dried beans  Protein supplements     Lab today    I put the referral in for dermatology  Please let us  know if you don't hear in 1-2 weeks to set that up the [employer] message or call or letter)

## 2024-06-06 NOTE — Assessment & Plan Note (Signed)
 Reviewed health habits including diet and exercise and skin cancer prevention Reviewed appropriate screening tests for age  Also reviewed health mt list, fam hx and immunization status , as well as social and family history   See HPI Labs reviewed and ordered Health Maintenance  Topic Date Due   Hepatitis C Screening  Never done   Hepatitis B Vaccine (1 of 3 - 19+ 3-dose series) Never done   COVID-19 Vaccine (3 - 2025-26 season) 05/16/2025*   DTaP/Tdap/Td vaccine (2 - Td or Tdap) 07/16/2025   Pap with HPV screening  12/27/2027   Flu Shot  Completed   HPV Vaccine (No Doses Required) Completed   HIV Screening  Completed   Pneumococcal Vaccine  Aged Out   Meningitis B Vaccine  Aged Out  *Topic was postponed. The date shown is not the original due date.    Sent for last pap from gyn  Is changing her gyn care to us  unless otherwise indicated Continues junel fe 1.5/30  Discussed fall prevention, supplements and exercise for bone density  Encouraged continued strength training and protein intake in setting of glp-1 for weight loss  PHQ 0 with treatment  Lab today

## 2024-06-06 NOTE — Assessment & Plan Note (Signed)
 Lab today.

## 2024-06-06 NOTE — Assessment & Plan Note (Signed)
 A1c ordered  Taking semaglutide Eating well Has lost significant weight

## 2024-06-07 ENCOUNTER — Ambulatory Visit: Payer: Self-pay | Admitting: Family Medicine

## 2024-06-07 LAB — TSH: TSH: 0.87 u[IU]/mL (ref 0.35–5.50)

## 2024-06-07 LAB — LIPID PANEL
Cholesterol: 178 mg/dL (ref 28–200)
HDL: 56.2 mg/dL
LDL Cholesterol: 105 mg/dL — ABNORMAL HIGH (ref 10–99)
NonHDL: 122.03
Total CHOL/HDL Ratio: 3
Triglycerides: 87 mg/dL (ref 10.0–149.0)
VLDL: 17.4 mg/dL (ref 0.0–40.0)

## 2024-06-07 LAB — CBC WITH DIFFERENTIAL/PLATELET
Basophils Absolute: 0.1 K/uL (ref 0.0–0.1)
Basophils Relative: 0.7 % (ref 0.0–3.0)
Eosinophils Absolute: 0.2 K/uL (ref 0.0–0.7)
Eosinophils Relative: 2.2 % (ref 0.0–5.0)
HCT: 41.2 % (ref 36.0–46.0)
Hemoglobin: 13.9 g/dL (ref 12.0–15.0)
Lymphocytes Relative: 37.7 % (ref 12.0–46.0)
Lymphs Abs: 2.9 K/uL (ref 0.7–4.0)
MCHC: 33.8 g/dL (ref 30.0–36.0)
MCV: 89.8 fl (ref 78.0–100.0)
Monocytes Absolute: 0.5 K/uL (ref 0.1–1.0)
Monocytes Relative: 6.3 % (ref 3.0–12.0)
Neutro Abs: 4 K/uL (ref 1.4–7.7)
Neutrophils Relative %: 53.1 % (ref 43.0–77.0)
Platelets: 296 K/uL (ref 150.0–400.0)
RBC: 4.59 Mil/uL (ref 3.87–5.11)
RDW: 12.7 % (ref 11.5–15.5)
WBC: 7.6 K/uL (ref 4.0–10.5)

## 2024-06-07 LAB — COMPREHENSIVE METABOLIC PANEL WITH GFR
ALT: 14 U/L (ref 3–35)
AST: 13 U/L (ref 5–37)
Albumin: 4.1 g/dL (ref 3.5–5.2)
Alkaline Phosphatase: 52 U/L (ref 39–117)
BUN: 12 mg/dL (ref 6–23)
CO2: 30 meq/L (ref 19–32)
Calcium: 9.3 mg/dL (ref 8.4–10.5)
Chloride: 101 meq/L (ref 96–112)
Creatinine, Ser: 0.8 mg/dL (ref 0.40–1.20)
GFR: 94.28 mL/min
Glucose, Bld: 92 mg/dL (ref 70–99)
Potassium: 4 meq/L (ref 3.5–5.1)
Sodium: 137 meq/L (ref 135–145)
Total Bilirubin: 0.4 mg/dL (ref 0.2–1.2)
Total Protein: 6.5 g/dL (ref 6.0–8.3)

## 2024-06-07 LAB — VITAMIN D 25 HYDROXY (VIT D DEFICIENCY, FRACTURES): VITD: 43.93 ng/mL (ref 30.00–100.00)

## 2024-06-07 LAB — VITAMIN B12: Vitamin B-12: 642 pg/mL (ref 211–911)

## 2024-06-07 LAB — HEMOGLOBIN A1C: Hgb A1c MFr Bld: 5.3 % (ref 4.6–6.5)

## 2024-06-26 ENCOUNTER — Ambulatory Visit
